# Patient Record
Sex: Male | Born: 1955 | Race: Asian | Hispanic: No | Marital: Married | Smoking: Former smoker
Health system: Southern US, Community
[De-identification: ages and names within clinical notes are randomized; demographics above are authoritative.]

## PROBLEM LIST (undated history)

## (undated) DIAGNOSIS — Z8719 Personal history of other diseases of the digestive system: Secondary | ICD-10-CM

## (undated) DIAGNOSIS — C679 Malignant neoplasm of bladder, unspecified: Secondary | ICD-10-CM

## (undated) DIAGNOSIS — I517 Cardiomegaly: Secondary | ICD-10-CM

## (undated) DIAGNOSIS — K769 Liver disease, unspecified: Secondary | ICD-10-CM

## (undated) DIAGNOSIS — N492 Inflammatory disorders of scrotum: Secondary | ICD-10-CM

## (undated) DIAGNOSIS — H9190 Unspecified hearing loss, unspecified ear: Secondary | ICD-10-CM

## (undated) DIAGNOSIS — K648 Other hemorrhoids: Secondary | ICD-10-CM

## (undated) DIAGNOSIS — C3490 Malignant neoplasm of unspecified part of unspecified bronchus or lung: Secondary | ICD-10-CM

## (undated) DIAGNOSIS — K759 Inflammatory liver disease, unspecified: Secondary | ICD-10-CM

## (undated) DIAGNOSIS — R079 Chest pain, unspecified: Secondary | ICD-10-CM

## (undated) DIAGNOSIS — R7303 Prediabetes: Secondary | ICD-10-CM

## (undated) HISTORY — PX: COLONOSCOPY: SHX174

## (undated) HISTORY — PX: UPPER GI ENDOSCOPY: SHX6162

## (undated) HISTORY — DX: Malignant neoplasm of bladder, unspecified: C67.9

## (undated) HISTORY — PX: BIOPSY TESTIS: PRO36

---

## 1998-10-09 ENCOUNTER — Ambulatory Visit (HOSPITAL_BASED_OUTPATIENT_CLINIC_OR_DEPARTMENT_OTHER): Admission: RE | Admit: 1998-10-09 | Discharge: 1998-10-09 | Payer: Self-pay | Admitting: Orthopedic Surgery

## 1999-07-21 ENCOUNTER — Ambulatory Visit (HOSPITAL_COMMUNITY): Admission: RE | Admit: 1999-07-21 | Discharge: 1999-07-21 | Payer: Self-pay | Admitting: Gastroenterology

## 2003-04-19 ENCOUNTER — Encounter: Payer: Self-pay | Admitting: Family Medicine

## 2003-04-19 ENCOUNTER — Encounter: Admission: RE | Admit: 2003-04-19 | Discharge: 2003-04-19 | Payer: Self-pay | Admitting: Family Medicine

## 2004-01-08 ENCOUNTER — Encounter: Admission: RE | Admit: 2004-01-08 | Discharge: 2004-01-08 | Payer: Self-pay | Admitting: Family Medicine

## 2004-06-19 ENCOUNTER — Encounter (INDEPENDENT_AMBULATORY_CARE_PROVIDER_SITE_OTHER): Payer: Self-pay | Admitting: *Deleted

## 2004-06-19 ENCOUNTER — Ambulatory Visit (HOSPITAL_COMMUNITY): Admission: RE | Admit: 2004-06-19 | Discharge: 2004-06-19 | Payer: Self-pay | Admitting: Gastroenterology

## 2004-10-07 ENCOUNTER — Encounter: Admission: RE | Admit: 2004-10-07 | Discharge: 2004-10-07 | Payer: Self-pay | Admitting: Gastroenterology

## 2004-12-23 ENCOUNTER — Encounter: Admission: RE | Admit: 2004-12-23 | Discharge: 2004-12-23 | Payer: Self-pay | Admitting: Family Medicine

## 2007-02-11 ENCOUNTER — Encounter: Admission: RE | Admit: 2007-02-11 | Discharge: 2007-02-11 | Payer: Self-pay | Admitting: Internal Medicine

## 2007-09-07 ENCOUNTER — Encounter: Admission: RE | Admit: 2007-09-07 | Discharge: 2007-09-07 | Payer: Self-pay | Admitting: Internal Medicine

## 2008-01-18 ENCOUNTER — Ambulatory Visit (HOSPITAL_COMMUNITY): Admission: RE | Admit: 2008-01-18 | Discharge: 2008-01-18 | Payer: Self-pay | Admitting: *Deleted

## 2008-01-18 ENCOUNTER — Encounter (INDEPENDENT_AMBULATORY_CARE_PROVIDER_SITE_OTHER): Payer: Self-pay | Admitting: *Deleted

## 2008-08-22 ENCOUNTER — Encounter: Admission: RE | Admit: 2008-08-22 | Discharge: 2008-08-22 | Payer: Self-pay | Admitting: Internal Medicine

## 2008-08-29 ENCOUNTER — Encounter: Admission: RE | Admit: 2008-08-29 | Discharge: 2008-08-29 | Payer: Self-pay | Admitting: Internal Medicine

## 2008-10-10 ENCOUNTER — Ambulatory Visit (HOSPITAL_BASED_OUTPATIENT_CLINIC_OR_DEPARTMENT_OTHER): Admission: RE | Admit: 2008-10-10 | Discharge: 2008-10-10 | Payer: Self-pay | Admitting: Urology

## 2008-10-10 ENCOUNTER — Encounter: Payer: Self-pay | Admitting: Urology

## 2009-12-04 ENCOUNTER — Encounter: Admission: RE | Admit: 2009-12-04 | Discharge: 2009-12-04 | Payer: Self-pay | Admitting: Internal Medicine

## 2010-02-24 ENCOUNTER — Encounter: Admission: RE | Admit: 2010-02-24 | Discharge: 2010-02-24 | Payer: Self-pay | Admitting: Internal Medicine

## 2010-07-02 ENCOUNTER — Encounter: Admission: RE | Admit: 2010-07-02 | Discharge: 2010-07-02 | Payer: Self-pay | Admitting: Internal Medicine

## 2011-04-28 NOTE — Op Note (Signed)
[name], Robert Lowery                     ACCOUNT NO.:  [id number]   MEDICAL RECORD NO.:  [id number]          PATIENT TYPE:  AMB   LOCATION:  ENDO                         FACILITY:  [facility]   PHYSICIAN:  Georgiana Spinner, M.D.    DATE OF BIRTH:  1956/11/11   DATE OF PROCEDURE:  01/18/2008  DATE OF DISCHARGE:                               OPERATIVE REPORT   PROCEDURE:  Upper endoscopy.   INDICATIONS:  Abdominal pain.   ANESTHESIA:  Fentanyl 50 mcg, Versed 4 mg.   PROCEDURE IN DETAIL:  With the patient mildly sedated in the left  lateral decubitus position the Pentax videoscopic endoscope was inserted  in the mouth and passed under direct vision through the esophagus which  appeared normal until we reached the distal esophagus and the  squamocolumnar junction would never fully open but there was an area  that I photographed and biopsied to rule out Barrett's esophagus.  We  entered into the stomach, fundus, body, antrum, duodenal bulb, second  portion of duodenum all appeared normal.  From this point the endoscope  was slowly withdrawn taking circumferential views of duodenal mucosa  until the endoscope had been pulled back into the stomach, placed in  retroflexion to view the stomach from below.  The endoscope was  straightened and withdrawn taking circumferential views of remaining  gastric and esophageal mucosa.  The patient's vital signs, pulse  oximeter remained stable.  The patient tolerated the procedure well  without apparent complication.   FINDINGS:  Loose wrap of the GE junction around the endoscope indicating  laxity of the lower esophageal sphincter, question of Barrett's  esophagus versus normal variant, biopsied.  Await biopsy report.  The  patient will call me for results and follow up with me as an outpatient  to proceed with colonoscopy as planned.           ______________________________  Georgiana Spinner, M.D.     GMO/MEDQ  D:  01/18/2008  T:  01/18/2008  Job:   [id number]

## 2011-04-28 NOTE — Op Note (Signed)
NAMEDJ, SENTENO                     ACCOUNT NO.:  [id number]   MEDICAL RECORD NO.:  [id number]          PATIENT TYPE:  AMB   LOCATION:  NESC                         FACILITY:  [facility]   PHYSICIAN:  Excell Seltzer. Annabell Howells, M.D.    DATE OF BIRTH:  07-18-1956   DATE OF PROCEDURE:  10/10/2008  DATE OF DISCHARGE:                               OPERATIVE REPORT   PROCEDURE:  Bilateral inguinal exploration with ultrasound-guided  excisional biopsy of bilateral testicular masses.   PREOPERATIVE DIAGNOSIS:  Bilateral testicular masses.   POSTOPERATIVE DIAGNOSIS:  Bilateral T1 seminoma.   SURGEON:  Dr. Bjorn Pippin.   ANESTHESIA:  General.   SPECIMEN:  Right and left testicular masses and no peripheral biopsies.   FINDINGS:  Frozen section of left testicular mass that is consistent  with seminoma.   DRAINS:  None.   BLOOD LOSS:  Minimal.   COMPLICATIONS:  None.   INDICATIONS:  Mr. Sabo is a 55 year old Asian male who was found to have  an enhancing lesion on a CT scan done for abdominal pain.  Follow-up  ultrasound revealed bilateral testicular hypoechoic lesions with  internal blood flow.  The lesion on the left measured 9 mm.  The lesion  on the right approximately 6 mm.  A panel of tumor markers was negative  and after reviewing the options, we have elected to proceed with  bilateral inguinal exploration with ultrasound-guided testicular biopsy.   FINDINGS AND PROCEDURE:  The patient was given a gram of Ancef.  He was  taken to the operating room where general anesthetic was induced.  He  was placed in the supine position.  His lower abdomen was shaved.  He  was prepped with Betadine solution and draped in usual sterile fashion.   A left inguinal incision was made with a knife.  This was carried down  through the subcutaneous tissues with a Bovie.  The external oblique  fascia was identified at the level of the ring.  It was then incised  along its fibers and the cord was identified.  The  ilioinguinal nerve  was identified and protected.  The cord was dissected out and the  testicle was delivered into the inguinal incision.  However, the  gubernacular attachments were not divided, but clamped.  A half inch  Penrose was used to occlude the cord prior to manipulation.  The tunica  vaginalis was then opened and a 10 MHz ultrasound probe was used to  localize the hypoechoic lesion in the mid lateral aspect of the testicle  adjacent to the epididymis.  A 30 gauge needle was placed into the  lesion under ultrasound guidance.  The tunics albuginea of the testicle  was then incised in a radial fashion to avoid large subcapsular blood  vessels.  A mosquito hemostat was then used to dissect out the small  mass targeted by the needle.  The mass had a slightly brownish cast  suggestive of seminoma.  It was removed intact and sent for frozen  section.  The frozen section returned consistent with the germ cell  tumor, probable seminoma.  Additional peripheral biopsies were obtained  to evaluate for carcinoma in situ.  The cord occlusion was then released  and hemostasis was achieved.  The capsule was closed with a running 4-0  PDS and the testicle was then returned to the tunica vaginalis which was  closed with a 3-0 chromic.  The testicle was then placed back into the  left hemi scrotum.  The external oblique fascia was closed using a  running 3-0 Vicryl.  The subcutaneous tissues were closed with  interrupted 3-0 chromic sutures and the skin was closed with a 4-0  Vicryl intracuticular stent.   The right side was then explored through an inguinal incision in  identical fashion.  The cord was dissected out and the testicle was  delivered leaving the gubernacular attachments intact.  The cord was  encircled with a 1/2-inch Penrose and occluded.  The tumor was localized  using ultrasound and a 30 gauge needle.  Once again this tumor was  located near the head of the epididymis and was  approximately 5 mm in  size.  The tunica albuginea was then incised once again to avoid  subcapsular vessels and the mass was dissected out and removed.  Since  we had had a positive frozen section on the left, we sent the right mass  for permanent section and Bouin solution which was the same solution  that the peripheral biopsies were sent in from the left.  Additional  peripheral biopsies to rule out carcinoma in situ were then sent as  well.  At this point, the occlusion was released, hemostasis was  achieved and the tunic albuginea was closed with 4-0 PDS followed by  closure of the tunica vaginalis with 3-0 chromic.  The testicle was  returned to the right hemi scrotum.  The external oblique fascia was  closed using a running 3-0 Vicryl.  Once again with care being taken to  avoid injury to the ilioinguinal nerve.  The subcutaneous tissues were  reapproximated using interrupted 3-0 chromic and the skin was closed  with a 4-0 Vicryl intracuticular stitch.  Prior to the final closure 1%  lidocaine with epinephrine was instilled injected 4 mL on each side.  At  the completion of the wound closures, half inch Steri-Strips cut in half  were placed to reinforce the wound followed by 4x4 dressing of fluff  Kerlix and scrotal support.  The patient's anesthetic was reversed.  He  was removed to the recovery room in stable condition.  There no  complications.      Excell Seltzer. Annabell Howells, M.D.  Electronically Signed     JJW/MEDQ  D:  10/11/2008  T:  10/11/2008  Job:  [id number]

## 2011-04-28 NOTE — Op Note (Signed)
Robert Lowery, Robert Lowery                     ACCOUNT NO.:  [id number]   MEDICAL RECORD NO.:  [id number]          PATIENT TYPE:  AMB   LOCATION:  ENDO                         FACILITY:  [facility]   PHYSICIAN:  Georgiana Spinner, M.D.    DATE OF BIRTH:  [birth year]   DATE OF PROCEDURE:  01/18/2008  DATE OF DISCHARGE:                               OPERATIVE REPORT   PROCEDURE:  Colonoscopy.   INDICATIONS:  Abdominal pain and colon cancer screening.   ANESTHESIA:  Fentanyl 25 mcg and Versed 2 mg.   PROCEDURE IN DETAIL:  With the patient mildly sedated in the left  lateral decubitus position, a rectal examination was performed which was  unremarkable to my exam.  Subsequently, the Pentax videoscopic  colonoscope was inserted into the rectum and passed under direct vision  to the cecum identified by the ileocecal valve and appendiceal orifice,  both of which were photographed. From this point, the colonoscope was  slowly withdrawn taking circumferential views of the colonic mucosa  stopping only to photograph and biopsy the ileocecal valve area which  appeared bumpy but I think was just prolapsing of terminal ileal type  tissue.  Photographs and biopsies were taken.  The endoscope was then  withdrawn, as noted, stopping only in the rectum which appeared normal  on direct showed hemorrhoids on retroflexed view. The endoscope was  straightened and withdrawn.  The patient's vital signs and pulse  oximeter remained stable.  The patient tolerated the procedure well  without apparent complication.   FINDINGS:  Internal hemorrhoids with probably, otherwise, unremarkable  exam.  Await biopsy report.  The patient will call me for results and  follow up with me as an outpatient.           ______________________________  Georgiana Spinner, M.D.     GMO/MEDQ  D:  01/18/2008  T:  01/18/2008  Job:  [id number]

## 2011-05-01 NOTE — Op Note (Signed)
NAME:  Robert Lowery, Robert Lowery                            ACCOUNT NO.:  [id number]   MEDICAL RECORD NO.:  [id number]                   PATIENT TYPE:  AMB   LOCATION:  ENDO                                 FACILITY:  [facility]   PHYSICIAN:  James L. Malon Kindle., M.D.          DATE OF BIRTH:  06-01-1956   DATE OF PROCEDURE:  06/19/2004  DATE OF DISCHARGE:                                 OPERATIVE REPORT   PROCEDURE:  Colonoscopy and biopsy.   MEDICATIONS:  1. Fentanyl 75 mcg.  2. Versed 7 mg IV.   INDICATION:  Multitude of vague GI symptoms, vague abdominal pain, and bowel  changes that come and go.  This is done to rule out some occult lesion of  the GI tract that may be causing the symptoms.   DESCRIPTION OF PROCEDURE:  The procedure had been explained to the patient  and consent obtained.  With the patient in the left lateral decubitus  position, the scope was inserted and advanced; a pediatric adjustable scope  was used.  Her prep was excellent.  We were able to reach the cecum without  difficulty.  The ileocecal valve appeared grossly abnormal with marked  cobblestoning, friability, and nodularity.  I entered the terminal ileum.  This had the same appearance in the terminal ileum.  Multiple biopsies were  obtained of the ileocecal valve and the terminal ileum and placed in jar #1.  I could not advance beyond about 5 cm into the terminal ileum.  The  remainder of the cecum was unremarkable as was the remainder of the colonic  mucosa.  The ascending, transverse, descending, and sigmoid colon were seen  well upon removal and were endoscopically normal.  Multiple random biopsies  were obtained, upon withdrawal placed in jar #2.  The rectum was normal from  withdrawal.  The scope was withdrawn.  The patient tolerated the procedure  well, was monitored throughout.   ASSESSMENT:  Cobblestoning of the ileocecal valve and the terminal ileum,  could be lymphoid hyperplasia or possibly Crohn's  disease.   PLAN:  We will check path and see back in the office in 3-4 weeks.                                               James L. Malon Kindle., M.D.    Waldron Session  D:  06/19/2004  T:  06/19/2004  Job:  [id number]   cc:   Thelma Barge P. Modesto Charon, M.D.  [address]  [place]  [place] [address]  Fax: [phone]

## 2011-07-07 ENCOUNTER — Other Ambulatory Visit: Payer: Self-pay | Admitting: Dermatology

## 2011-09-15 LAB — POCT HEMOGLOBIN-HEMACUE: Hemoglobin: 14.7

## 2012-09-22 ENCOUNTER — Other Ambulatory Visit: Payer: Self-pay | Admitting: Internal Medicine

## 2012-09-22 DIAGNOSIS — R1011 Right upper quadrant pain: Secondary | ICD-10-CM

## 2012-09-23 ENCOUNTER — Ambulatory Visit
Admission: RE | Admit: 2012-09-23 | Discharge: 2012-09-23 | Disposition: A | Discharge status: Home / Self Care | Payer: BC Managed Care – PPO | Source: Ambulatory Visit | Attending: Internal Medicine | Admitting: Internal Medicine

## 2012-09-23 DIAGNOSIS — R1011 Right upper quadrant pain: Secondary | ICD-10-CM

## 2013-11-06 ENCOUNTER — Ambulatory Visit
Admission: RE | Admit: 2013-11-06 | Discharge: 2013-11-06 | Disposition: A | Discharge status: Home / Self Care | Payer: BC Managed Care – PPO | Source: Ambulatory Visit | Attending: Internal Medicine | Admitting: Internal Medicine

## 2013-11-06 ENCOUNTER — Other Ambulatory Visit: Payer: Self-pay | Admitting: Internal Medicine

## 2013-11-06 DIAGNOSIS — R05 Cough: Secondary | ICD-10-CM

## 2014-12-04 ENCOUNTER — Other Ambulatory Visit: Payer: Self-pay | Admitting: Internal Medicine

## 2014-12-04 ENCOUNTER — Ambulatory Visit
Admission: RE | Admit: 2014-12-04 | Discharge: 2014-12-04 | Disposition: A | Discharge status: Home / Self Care | Payer: BC Managed Care – PPO | Source: Ambulatory Visit | Attending: Internal Medicine | Admitting: Internal Medicine

## 2014-12-04 DIAGNOSIS — R059 Cough, unspecified: Secondary | ICD-10-CM

## 2014-12-04 DIAGNOSIS — R05 Cough: Secondary | ICD-10-CM

## 2016-05-30 DIAGNOSIS — Z8719 Personal history of other diseases of the digestive system: Secondary | ICD-10-CM

## 2016-05-30 HISTORY — DX: Personal history of other diseases of the digestive system: Z87.19

## 2018-05-02 ENCOUNTER — Other Ambulatory Visit: Payer: Self-pay | Admitting: Urology

## 2018-05-04 ENCOUNTER — Encounter (HOSPITAL_COMMUNITY): Payer: Self-pay

## 2018-05-05 ENCOUNTER — Other Ambulatory Visit: Payer: Self-pay

## 2018-05-05 ENCOUNTER — Encounter (HOSPITAL_COMMUNITY): Payer: Self-pay

## 2018-05-05 ENCOUNTER — Encounter (HOSPITAL_COMMUNITY)
Admission: RE | Admit: 2018-05-05 | Discharge: 2018-05-05 | Disposition: A | Discharge status: Home / Self Care | Payer: Self-pay | Source: Ambulatory Visit | Attending: Urology | Admitting: Urology

## 2018-05-05 DIAGNOSIS — D494 Neoplasm of unspecified behavior of bladder: Secondary | ICD-10-CM | POA: Insufficient documentation

## 2018-05-05 DIAGNOSIS — Z01812 Encounter for preprocedural laboratory examination: Secondary | ICD-10-CM | POA: Insufficient documentation

## 2018-05-05 HISTORY — DX: Other hemorrhoids: K64.8

## 2018-05-05 HISTORY — DX: Unspecified hearing loss, unspecified ear: H91.90

## 2018-05-05 HISTORY — DX: Inflammatory disorders of scrotum: N49.2

## 2018-05-05 HISTORY — DX: Chest pain, unspecified: R07.9

## 2018-05-05 HISTORY — DX: Inflammatory liver disease, unspecified: K75.9

## 2018-05-05 HISTORY — DX: Personal history of other diseases of the digestive system: Z87.19

## 2018-05-05 HISTORY — DX: Liver disease, unspecified: K76.9

## 2018-05-05 HISTORY — DX: Cardiomegaly: I51.7

## 2018-05-05 LAB — CBC
HEMATOCRIT: 44 % (ref 39.0–52.0)
Hemoglobin: 14.6 g/dL (ref 13.0–17.0)
MCH: 29.6 pg (ref 26.0–34.0)
MCHC: 33.2 g/dL (ref 30.0–36.0)
MCV: 89.1 fL (ref 78.0–100.0)
Platelets: 277 10*3/uL (ref 150–400)
RBC: 4.94 MIL/uL (ref 4.22–5.81)
RDW: 13.1 % (ref 11.5–15.5)
WBC: 4.1 10*3/uL (ref 4.0–10.5)

## 2018-05-05 NOTE — Patient Instructions (Signed)
Your procedure is scheduled on: Tuesday, May 10, 2018   Surgery Time:  10:00AM-11:30AM   Report to [facility]  Entrance    Report to admitting at 8:00 AM   Call this number if you have problems the morning of surgery [phone]   Do not eat food or drink liquids :After Midnight.   Do NOT smoke after Midnight   Take these medicines the morning of surgery with A SIP OF WATER: None                               You may not have any metal on your body including  jewelry, and body piercings             Do not wear lotions, powders, perfumes/cologne, or deodorant                          Men may shave face and neck.   Do not bring valuables to the hospital. [place].   Contacts, dentures or bridgework may not be worn into surgery.    Patients discharged the day of surgery will not be allowed to drive home.   Name and phone number of your driver:  Claiborne Billings (wife) [phone]   Special Instructions: Bring a copy of your healthcare power of attorney and living will documents         the day of surgery if you haven't scanned them in before.              Please read over the following fact sheets you were given:  [facility] - Preparing for Surgery Before surgery, you can play an important role.  Because skin is not sterile, your skin needs to be as free of germs as possible.  You can reduce the number of germs on your skin by washing with CHG (chlorahexidine gluconate) soap before surgery.  CHG is an antiseptic cleaner which kills germs and bonds with the skin to continue killing germs even after washing. Please DO NOT use if you have an allergy to CHG or antibacterial soaps.  If your skin becomes reddened/irritated stop using the CHG and inform your nurse when you arrive at Short Stay. Do not shave (including legs and underarms) for at least 48 hours prior to the first CHG shower.  You may shave your  face/neck.  Please follow these instructions carefully:  1.  Shower with CHG Soap the night before surgery and the  morning of surgery.  2.  If you choose to wash your hair, wash your hair first as usual with your normal  shampoo.  3.  After you shampoo, rinse your hair and body thoroughly to remove the shampoo.                             4.  Use CHG as you would any other liquid soap.  You can apply chg directly to the skin and wash.  Gently with a scrungie or clean washcloth.  5.  Apply the CHG Soap to your body ONLY FROM THE NECK DOWN.   Do   not use on face/ open  Wound or open sores. Avoid contact with eyes, ears mouth and   genitals (private parts).                       Wash face,  Genitals (private parts) with your normal soap.             6.  Wash thoroughly, paying special attention to the area where your    surgery  will be performed.  7.  Thoroughly rinse your body with warm water from the neck down.  8.  DO NOT shower/wash with your normal soap after using and rinsing off the CHG Soap.                9.  Pat yourself dry with a clean towel.            10.  Wear clean pajamas.            11.  Place clean sheets on your bed the night of your first shower and do not  sleep with pets. Day of Surgery : Do not apply any lotions/deodorants the morning of surgery.  Please wear clean clothes to the hospital/surgery center.  FAILURE TO FOLLOW THESE INSTRUCTIONS MAY RESULT IN THE CANCELLATION OF YOUR SURGERY  PATIENT SIGNATURE_________________________________  NURSE SIGNATURE__________________________________  ________________________________________________________________________

## 2018-05-09 NOTE — H&P (Signed)
CC/HPI: CC: Bladder mass  Seen at [facility] in 03/2018 w/ vague abdominal pain. CT A/P w and w/o contrast revealed a 2.0cm anterior wall mass. No hydronephrosis. The patient denies any bouts of gross hematuria. No dysuria, intermittency, frequency, urge or hesitancy. No flank pain.   Smoked for 20 years, 1/2 a day. Has not smoked in the past 22 years.   CT from [facility] personally reviewed - 2.0 cm anterior bladder wall mass with peripheral calcifications. No hydronephrosis.   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 Jeffie Pollock. Has not had a recent scrotal ultrasound. No testicular pain. No inguinal pain. On exam no inguinal induration or firmness. Testicle soft and mobile.   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     ALLERGIES: No Allergies    MEDICATIONS: Aspir 81  Calcium + D  Omeprazole 20 mg capsule,delayed release Oral     GU PSH: Excise Lesion Testis - 2009      PSH Notes: Surgery Testis Excision Of Local Lesion Bilateral, Hand Surgery   NON-GU PSH: None   GU PMH: LLQ pain, Abdominal Pain In The Left Lower Belly (LLQ) - 2014 Personal Hx Oth male genital organs diseases, History of testicular mass - 2014 Testicular pain, unspecified, Testicular pain - 2014, Testicular Pain, - 2014    NON-GU PMH: Encounter for general adult medical examination without abnormal findings, Encounter for preventive health examination GERD    FAMILY HISTORY: Family Health Status Number - Runs In Family   SOCIAL HISTORY: None    Notes: Caffeine Use, Marital History - Currently Married, Previous History Of Smoking, Occupation:, Alcohol Use   REVIEW OF SYSTEMS:    GU Review Male:   Patient reports stream starts and stops and trouble starting your stream. Patient denies frequent urination, hard to postpone urination, burning/ pain with urination, get up at night to urinate,  leakage of urine, have to strain to urinate , erection problems, and penile pain.  Gastrointestinal (Upper):   Patient denies nausea, vomiting, and indigestion/ heartburn.  Gastrointestinal (Lower):   Patient denies diarrhea and constipation.  Constitutional:   Patient denies fever, night sweats, weight loss, and fatigue.  Skin:   Patient reports skin rash/ lesion and itching.   Eyes:   Patient denies blurred vision and double vision.  Ears/ Nose/ Throat:   Patient denies sore throat and sinus problems.  Hematologic/Lymphatic:   Patient denies swollen glands and easy bruising.  Cardiovascular:   Patient reports chest pains. Patient denies leg swelling.  Respiratory:   Patient denies cough and shortness of breath.  Endocrine:   Patient denies excessive thirst.  Musculoskeletal:   Patient denies back pain and joint pain.  Neurological:   Patient denies headaches and dizziness.  Psychologic:   Patient denies depression and anxiety.   VITAL SIGNS:      04/28/2018 02:45 PM  Weight 143 lb / 64.86 kg  Height 65 in / 165.1 cm  BP 128/76 mmHg  Pulse 52 /min  Temperature 97.4 F / 36.3 C  BMI 23.8 kg/m   GU PHYSICAL EXAMINATION:      Notes: Circumcised phallus, bilateral testis descended. Non tender, soft, mobile. Inguinal region with no firmness or nodularity.    MULTI-SYSTEM PHYSICAL EXAMINATION:    Constitutional: Well-nourished. No physical deformities. Normally developed. Good grooming.  Neck: Neck symmetrical, not swollen. Normal tracheal position.  Respiratory: No labored  breathing, no use of accessory muscles. Normal breath sounds.  Cardiovascular: Regular rate and rhythm. No murmur, no gallop. Normal temperature, normal extremity pulses, no swelling, no varicosities.  Lymphatic: No enlargement of neck, axillae, groin.  Skin: No paleness, no jaundice, no cyanosis. No lesion, no ulcer, no rash.  Neurologic / Psychiatric: Oriented to time, oriented to place, oriented to person. No  depression, no anxiety, no agitation.  Gastrointestinal: No mass, no tenderness, no rigidity, non obese abdomen.  Eyes: Normal conjunctivae. Normal eyelids.  Ears, Nose, Mouth, and Throat: Left ear no scars, no lesions, no masses. Right ear no scars, no lesions, no masses. Nose no scars, no lesions, no masses. Normal hearing. Normal lips.  Musculoskeletal: Normal gait and station of head and neck.     PAST DATA REVIEWED:  Source Of History:  Patient, Family/Caregiver  Records Review:   Previous Patient Records  X-Ray Review: C.T. Abdomen/Pelvis: Reviewed Films.     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 Discussed cysto in office to confirm CT findings vs proceeding directly to TURBT. Given cost concerns and suggstive findings on CT, will proceed directly to TURBT w/ b/l RPG.   Risks of TURBT discussed in detail - infection, bleeding, damage to associated structures - specifically bladder   Testes Ca- Hx of Leidig cell tumors. No recent scrotal ultrasound. Exam encouraging. Will  proceed with TURBT and diagnosis of bladder cancer. Will obtain scrotal ultrasound after follow up   Dr. Louis Meckel supervised this visit

## 2018-05-10 ENCOUNTER — Ambulatory Visit (HOSPITAL_COMMUNITY): Payer: Self-pay | Admitting: Anesthesiology

## 2018-05-10 ENCOUNTER — Ambulatory Visit (HOSPITAL_COMMUNITY)
Admission: RE | Admit: 2018-05-10 | Discharge: 2018-05-10 | Disposition: A | Discharge status: Home / Self Care | Payer: Self-pay | Source: Ambulatory Visit | Attending: Urology | Admitting: Urology

## 2018-05-10 ENCOUNTER — Ambulatory Visit (HOSPITAL_COMMUNITY): Payer: Self-pay

## 2018-05-10 ENCOUNTER — Encounter (HOSPITAL_COMMUNITY)
Admission: RE | Disposition: A | Discharge status: Home / Self Care | Payer: Self-pay | Source: Ambulatory Visit | Attending: Urology

## 2018-05-10 ENCOUNTER — Encounter (HOSPITAL_COMMUNITY): Payer: Self-pay

## 2018-05-10 DIAGNOSIS — K219 Gastro-esophageal reflux disease without esophagitis: Secondary | ICD-10-CM | POA: Insufficient documentation

## 2018-05-10 DIAGNOSIS — N50819 Testicular pain, unspecified: Secondary | ICD-10-CM | POA: Insufficient documentation

## 2018-05-10 DIAGNOSIS — Z7982 Long term (current) use of aspirin: Secondary | ICD-10-CM | POA: Insufficient documentation

## 2018-05-10 DIAGNOSIS — C679 Malignant neoplasm of bladder, unspecified: Secondary | ICD-10-CM

## 2018-05-10 DIAGNOSIS — Z79899 Other long term (current) drug therapy: Secondary | ICD-10-CM | POA: Insufficient documentation

## 2018-05-10 DIAGNOSIS — Z87891 Personal history of nicotine dependence: Secondary | ICD-10-CM | POA: Insufficient documentation

## 2018-05-10 DIAGNOSIS — Z8547 Personal history of malignant neoplasm of testis: Secondary | ICD-10-CM | POA: Insufficient documentation

## 2018-05-10 DIAGNOSIS — C673 Malignant neoplasm of anterior wall of bladder: Secondary | ICD-10-CM | POA: Insufficient documentation

## 2018-05-10 HISTORY — PX: TRANSURETHRAL RESECTION OF BLADDER TUMOR WITH MITOMYCIN-C: SHX6459

## 2018-05-10 HISTORY — PX: CYSTOSCOPY/RETROGRADE/URETEROSCOPY: SHX5316

## 2018-05-10 HISTORY — DX: Malignant neoplasm of bladder, unspecified: C67.9

## 2018-05-10 SURGERY — TRANSURETHRAL RESECTION OF BLADDER TUMOR WITH MITOMYCIN-C
Anesthesia: General | Site: Bladder

## 2018-05-10 MED ORDER — DEXAMETHASONE SODIUM PHOSPHATE 4 MG/ML IJ SOLN
INTRAMUSCULAR | Status: DC | PRN
  Administered 2018-05-10: 10 mg via INTRAVENOUS

## 2018-05-10 MED ORDER — MITOMYCIN CHEMO FOR BLADDER INSTILLATION 40 MG: 40.0000 mg | Freq: Once | INTRAVENOUS | Status: DC

## 2018-05-10 MED ORDER — FENTANYL CITRATE (PF) 100 MCG/2ML IJ SOLN
INTRAMUSCULAR | Status: AC
  Filled 2018-05-10: qty 2

## 2018-05-10 MED ORDER — OXYCODONE HCL 5 MG PO CAPS
5.0000 mg | ORAL_CAPSULE | ORAL | 0 refills | Status: DC | PRN
Start: 2018-05-10 — End: 2019-05-10

## 2018-05-10 MED ORDER — MIDAZOLAM HCL 5 MG/5ML IJ SOLN
INTRAMUSCULAR | Status: DC | PRN
  Administered 2018-05-10 (×2): 1 mg via INTRAVENOUS

## 2018-05-10 MED ORDER — SODIUM CHLORIDE 0.9 % IR SOLN
Status: DC | PRN
  Administered 2018-05-10: 15000 mL
  Administered 2018-05-10: 1000 mL

## 2018-05-10 MED ORDER — ROCURONIUM BROMIDE 100 MG/10ML IV SOLN
INTRAVENOUS | Status: DC | PRN
  Administered 2018-05-10: 50 mg via INTRAVENOUS

## 2018-05-10 MED ORDER — ASPIRIN 81 MG PO CHEW: 81.0000 mg | CHEWABLE_TABLET | Freq: Every day | ORAL | Status: DC

## 2018-05-10 MED ORDER — DEXAMETHASONE SODIUM PHOSPHATE 10 MG/ML IJ SOLN
INTRAMUSCULAR | Status: AC
  Filled 2018-05-10: qty 1

## 2018-05-10 MED ORDER — FENTANYL CITRATE (PF) 100 MCG/2ML IJ SOLN
25.0000 ug | INTRAMUSCULAR | Status: DC | PRN
  Administered 2018-05-10: 25 ug via INTRAVENOUS

## 2018-05-10 MED ORDER — PROPOFOL 10 MG/ML IV BOLUS
INTRAVENOUS | Status: AC
  Filled 2018-05-10: qty 40

## 2018-05-10 MED ORDER — LACTATED RINGERS IV SOLN
INTRAVENOUS | Status: DC
  Administered 2018-05-10: 09:00:00 via INTRAVENOUS

## 2018-05-10 MED ORDER — MIDAZOLAM HCL 2 MG/2ML IJ SOLN
INTRAMUSCULAR | Status: AC
  Filled 2018-05-10: qty 2

## 2018-05-10 MED ORDER — ONDANSETRON HCL 4 MG/2ML IJ SOLN
INTRAMUSCULAR | Status: DC | PRN
  Administered 2018-05-10: 4 mg via INTRAVENOUS

## 2018-05-10 MED ORDER — CEFAZOLIN SODIUM-DEXTROSE 2-4 GM/100ML-% IV SOLN
2.0000 g | INTRAVENOUS | Status: AC
  Administered 2018-05-10: 2 g via INTRAVENOUS
  Filled 2018-05-10: qty 100

## 2018-05-10 MED ORDER — FENTANYL CITRATE (PF) 100 MCG/2ML IJ SOLN
INTRAMUSCULAR | Status: DC | PRN
  Administered 2018-05-10: 100 ug via INTRAVENOUS

## 2018-05-10 MED ORDER — PROPOFOL 10 MG/ML IV BOLUS
INTRAVENOUS | Status: DC | PRN
  Administered 2018-05-10: 150 mg via INTRAVENOUS

## 2018-05-10 MED ORDER — SODIUM CHLORIDE 0.9 % IV SOLN
INTRAVENOUS | Status: DC | PRN
  Administered 2018-05-10: 12 mL

## 2018-05-10 MED ORDER — ROCURONIUM BROMIDE 10 MG/ML (PF) SYRINGE
PREFILLED_SYRINGE | INTRAVENOUS | Status: AC
  Filled 2018-05-10: qty 5

## 2018-05-10 MED ORDER — MITOMYCIN CHEMO FOR BLADDER INSTILLATION 40 MG
40.0000 mg | Freq: Once | INTRAVENOUS | Status: AC
  Administered 2018-05-10: 40 mg via INTRAVESICAL
  Filled 2018-05-10: qty 40

## 2018-05-10 MED ORDER — SUGAMMADEX SODIUM 200 MG/2ML IV SOLN
INTRAVENOUS | Status: DC | PRN
  Administered 2018-05-10: 200 mg via INTRAVENOUS

## 2018-05-10 MED ORDER — ONDANSETRON HCL 4 MG/2ML IJ SOLN
INTRAMUSCULAR | Status: AC
  Filled 2018-05-10: qty 2

## 2018-05-10 SURGICAL SUPPLY — 28 items
BAG URINE DRAINAGE (UROLOGICAL SUPPLIES) ×3 IMPLANT
BAG URO CATCHER STRL LF (MISCELLANEOUS) ×3 IMPLANT
BASKET STONE NCOMPASS (UROLOGICAL SUPPLIES) IMPLANT
CATH FOLEY 2WAY SLVR  5CC 20FR (CATHETERS) ×2
CATH FOLEY 2WAY SLVR 5CC 20FR (CATHETERS) ×1 IMPLANT
CATH URET 5FR 28IN OPEN ENDED (CATHETERS) IMPLANT
CATH URET DUAL LUMEN 6-10FR 50 (CATHETERS) ×3 IMPLANT
CLOTH BEACON ORANGE TIMEOUT ST (SAFETY) ×3 IMPLANT
COVER FOOTSWITCH UNIV (MISCELLANEOUS) IMPLANT
COVER SURGICAL LIGHT HANDLE (MISCELLANEOUS) ×3 IMPLANT
EXTRACTOR STONE NITINOL NGAGE (UROLOGICAL SUPPLIES) IMPLANT
FIBER LASER FLEXIVA 1000 (UROLOGICAL SUPPLIES) IMPLANT
FIBER LASER FLEXIVA 365 (UROLOGICAL SUPPLIES) IMPLANT
FIBER LASER FLEXIVA 550 (UROLOGICAL SUPPLIES) IMPLANT
FIBER LASER TRAC TIP (UROLOGICAL SUPPLIES) IMPLANT
GLOVE SURG SS PI 8.0 STRL IVOR (GLOVE) IMPLANT
GOWN STRL REUS W/TWL XL LVL3 (GOWN DISPOSABLE) ×3 IMPLANT
GUIDEWIRE STR DUAL SENSOR (WIRE) IMPLANT
IV NS 1000ML (IV SOLUTION) ×2
IV NS 1000ML BAXH (IV SOLUTION) ×1 IMPLANT
IV NS IRRIG 3000ML ARTHROMATIC (IV SOLUTION) ×3 IMPLANT
MANIFOLD NEPTUNE II (INSTRUMENTS) ×3 IMPLANT
PACK CYSTO (CUSTOM PROCEDURE TRAY) ×3 IMPLANT
SHEATH URETERAL 12FRX35CM (MISCELLANEOUS) ×3 IMPLANT
SYR 10ML LL (SYRINGE) ×3 IMPLANT
SYRINGE IRR TOOMEY STRL 70CC (SYRINGE) ×3 IMPLANT
TUBING CONNECTING 10 (TUBING) ×2 IMPLANT
TUBING CONNECTING 10' (TUBING) ×1

## 2018-05-10 NOTE — Anesthesia Postprocedure Evaluation (Signed)
Anesthesia Post Note  Patient: Robert Lowery  Procedure(s) Performed: CYSTOSCOPY TRANSURETHRAL RESECTION OF BLADDER TUMOR WITH MITOMYCIN-C (N/A Bladder) BILATERAL RETROGRADE (N/A Bladder)     Patient location during evaluation: PACU Anesthesia Type: General Level of consciousness: sedated Pain management: pain level controlled Vital Signs Assessment: post-procedure vital signs reviewed and stable Respiratory status: spontaneous breathing and respiratory function stable Cardiovascular status: stable Postop Assessment: no apparent nausea or vomiting Anesthetic complications: no    Last Vitals:  Vitals:   05/10/18 1345 05/10/18 1410  BP: (!) 153/73 (!) 142/72  Pulse: (!) 56 (!) 58  Resp: 14 16  Temp: (!) 36.4 C (!) 36.3 C  SpO2: 98% 99%    Last Pain:  Vitals:   05/10/18 1410  TempSrc:   PainSc: 0-No pain                 Kasra Melvin DANIEL

## 2018-05-10 NOTE — Anesthesia Procedure Notes (Signed)
Procedure Name: Intubation Date/Time: 05/10/2018 11:08 AM Performed by: Deliah Boston, CRNA Pre-anesthesia Checklist: Patient identified, Emergency Drugs available, Suction available and Patient being monitored Patient Re-evaluated:Patient Re-evaluated prior to induction Oxygen Delivery Method: Circle system utilized Preoxygenation: Pre-oxygenation with 100% oxygen Induction Type: IV induction Ventilation: Mask ventilation without difficulty Laryngoscope Size: Mac and 4 Grade View: Grade I Tube type: Oral Tube size: 7.5 mm Number of attempts: 1 Airway Equipment and Method: Stylet and Oral airway Placement Confirmation: ETT inserted through vocal cords under direct vision,  positive ETCO2 and breath sounds checked- equal and bilateral Secured at: 21 cm Tube secured with: Tape Dental Injury: Teeth and Oropharynx as per pre-operative assessment

## 2018-05-10 NOTE — Op Note (Addendum)
PATIENT:  [place] DIAGNOSIS: Bladder tumor  POST-OPERATIVE DIAGNOSIS: Same  PROCEDURE:  Procedure(s): 1. TRANSURETHRAL RESECTION OF BLADDER TUMOR (TURBT) (> 2 cm.) 2. Instillation of intravesical chemotherapy (mitomycin-C)  SURGEON:  Surgeon(s): [name] MD     Resident: Fredrik Rigger MD  ANESTHESIA:   General  EBL:  Minimal  DRAINS: Urethral catheter (20 Fr. Foley)   SPECIMEN:  Bladder tumor  DISPOSITION OF SPECIMEN:  PATHOLOGY  Indication: 62 y/o M with recent vague abdm pain who had a CT scan which showed a large anterior wall bladder tumor. Discussed options and patient opted for TURBT with biltateral retrograde pyelograms. Risk and benefits discussed.   Description of operation: The patient was taken to the operating room and administered general anesthesia. They were then placed on the table and moved to the dorsal lithotomy position after which the genitalia was sterilely prepped and draped. An official timeout was then performed.  We began with a 34fr cystoscope. The anterior and posterior urethra were normal. The bladder was systematically evaluated. There was a large anterior bladder wall tumor with superficial calcifications. No obvious satellite lesions or surrounding erythema. No other tumors were seen.   The ureteral orifices were in orthotopic position. We performed bilateral retrograde pyelograms with a 79fr open ended catheter.  Right RPG : delicate ureter with no filling defects, normal kidney with no hydronephrosis and normal calyxes. Left RPG: delicate ureter with no filling defects, normal kidney with no hydronephrosis and normal calyxes.   We switched to a 23 French resectoscope with the 30 lens and visual obturator were then passed into the bladder under direct visualization. The visual obturator was then removed and the Gyrus resectoscope element with 30  lens was then inserted.  I first began by resecting the anterior wall tumor. It was  hypervascular but we were able to perform a complete resection to the level of the muscularis propria. No obvious perforation noted. We had excellent hemostasis at the end of our resection.   The bladder was emptied and filled. reinspection of the bladder revealed all obvious tumor had been fully resected and there was no evidence of perforation. A [place]y was then used to irrigate the bladder and remove all of the portions of bladder tumor which were sent to pathology. I then removed the resectoscope.  A 20 French Foley catheter was then inserted in the bladder and irrigated. The irrigant returned lite pink urine.  The patient was awakened and taken to the recovery room.  While in the recovery room 40 mg of mitomycin-C in 40 cc of water were instilled in the bladder through the catheter and the catheter was plugged. This will remain indwelling for approximately one hour. It will then be drained from the bladder. The patient will go home with the foley and remove it at home.   PLAN OF CARE:  -Discharge to home after PACU.  -Remove foley catheter at home tomorrow at 6 am.   PATIENT DISPOSITION:  PACU - hemodynamically stable.

## 2018-05-10 NOTE — Anesthesia Preprocedure Evaluation (Addendum)
Anesthesia Evaluation  Patient identified by MRN, date of birth, ID band Patient awake    Reviewed: Allergy & Precautions, NPO status , Patient's Chart, lab work & pertinent test results  History of Anesthesia Complications Negative for: history of anesthetic complications  Airway Mallampati: II  TM Distance: >3 FB Neck ROM: Full    Dental no notable dental hx.    Pulmonary former smoker,    Pulmonary exam normal        Cardiovascular negative cardio ROS Normal cardiovascular exam     Neuro/Psych negative neurological ROS  negative psych ROS   GI/Hepatic hiatal hernia, (+) Hepatitis -, B  Endo/Other  negative endocrine ROS  Renal/GU negative Renal ROS  negative genitourinary   Musculoskeletal negative musculoskeletal ROS (+)   Abdominal   Peds negative pediatric ROS (+)  Hematology negative hematology ROS (+)   Anesthesia Other Findings   Reproductive/Obstetrics negative OB ROS                             Anesthesia Physical Anesthesia Plan  ASA: II  Anesthesia Plan: General   Post-op Pain Management:    Induction: Intravenous  PONV Risk Score and Plan: 2 and Ondansetron and Dexamethasone  Airway Management Planned: Oral ETT  Additional Equipment:   Intra-op Plan:   Post-operative Plan: Extubation in OR  Informed Consent: I have reviewed the patients History and Physical, chart, labs and discussed the procedure including the risks, benefits and alternatives for the proposed anesthesia with the patient or authorized representative who has indicated his/her understanding and acceptance.   Dental advisory given  Plan Discussed with: Anesthesiologist  Anesthesia Plan Comments:        Anesthesia Quick Evaluation

## 2018-05-10 NOTE — Interval H&P Note (Signed)
History and Physical Interval Note:  05/10/2018 10:16 AM  Robert Lowery  has presented today for surgery, with the diagnosis of bladder tumor  The various methods of treatment have been discussed with the patient and family. After consideration of risks, benefits and other options for treatment, the patient has consented to  Procedure(s): CYSTOSCOPY TRANSURETHRAL RESECTION OF BLADDER TUMOR WITH MITOMYCIN-C (N/A) BILATERAL RETROGRADE (N/A) as a surgical intervention .  The patient's history has been reviewed, patient examined, no change in status, stable for surgery.  I have reviewed the patient's chart and labs.  Questions were answered to the patient's satisfaction.     [name]

## 2018-05-10 NOTE — Transfer of Care (Signed)
Immediate Anesthesia Transfer of Care Note  Patient: Robert Lowery  Procedure(s) Performed: Procedure(s): CYSTOSCOPY TRANSURETHRAL RESECTION OF BLADDER TUMOR WITH MITOMYCIN-C (N/A) BILATERAL RETROGRADE (N/A)  Patient Location: PACU  Anesthesia Type:General  Level of Consciousness: Patient easily awoken, sedated, comfortable, cooperative, following commands, responds to stimulation.   Airway & Oxygen Therapy: Patient spontaneously breathing, ventilating well, oxygen via simple oxygen mask.  Post-op Assessment: Report given to PACU RN, vital signs reviewed and stable, moving all extremities.   Post vital signs: Reviewed and stable.  Complications: No apparent anesthesia complications  Last Vitals:  Vitals Value Taken Time  BP 162/81 05/10/2018 12:19 PM  Temp    Pulse 59 05/10/2018 12:21 PM  Resp 14 05/10/2018 12:21 PM  SpO2 100 % 05/10/2018 12:21 PM  Vitals shown include unvalidated device data.  Last Pain:  Vitals:   05/10/18 0844  TempSrc:   PainSc: 0-No pain         Complications: No apparent anesthesia complications

## 2018-05-10 NOTE — Discharge Instructions (Addendum)
Discharge instructions (Order [id number])  Nursing  Date: 05/10/2018 Department: [facility] Ordering: Alla Feeling, MD Authorizing: [name], MD  Panel Information   Panel 1   Surgeon Role  [name], MD Primary   Procedure Laterality Anesthesia  CYSTOSCOPY TRANSURETHRAL RESECTION OF BLADDER TUMOR WITH MITOMYCIN-C N/A General  BILATERAL RETROGRADE N/A Dorisann Frames, MD NPI: [id number]    Patient Information   Patient Name Robert Lowery, Robert Lowery [facility] Sex Male DOB [birth year] SSN [id number]  Order Information   Order Date/Time Release Date/Time Start Date/Time End Date/Time  05/10/18 12:23 PM None 05/10/2018 None  Order History  Outpatient  Date/Time Action Taken User Additional Information  05/10/18 1223 Sign Alla Feeling, MD Modify from Order: [id number]  Comments   - Please do not restart your ASA81mg  for at least 5 days  - Please remove your catheter tomorrow morning at 6 am - If you do not void after 6 hours, have trouble passing your urine, or are passing clots please call our office.   Activity: You are encouraged to ambulate frequently (about every hour during waking hours) to help prevent blood clots from forming in your legs or lungs. However, you should not engage in any heavy lifting (> 10-15 lbs), strenuous activity, or straining. Diet: You should advance your diet as instructed by your physician. It will be normal to have some bloating, nausea, and abdominal discomfort intermittently. Prescriptions: You will be provided a prescription for pain medication to take as needed. If your pain is not severe enough to require the prescription pain medication, you may take extra strength Tylenol instead which will have less side effects. You should also take a prescribed stool softener to avoid straining with bowel movements as the prescription pain medication may constipate you. Incisions: You may remove your dressing bandages 48 hours after  surgery if not removed in the hospital. You will either have some small staples or special tissue glue at each of the incision sites. Once the bandages are removed (if present), the incisions may stay open to air. You may start showering (but not soaking or bathing in water) the 2nd day after surgery and the incisions simply need to be patted dry after the shower. No additional care is needed. What to call us about: You should call the office [phone]) if you develop fever > 101 or develop persistent vomiting.

## 2018-05-11 ENCOUNTER — Encounter (HOSPITAL_COMMUNITY): Payer: Self-pay | Admitting: Urology

## 2018-10-21 ENCOUNTER — Ambulatory Visit (HOSPITAL_COMMUNITY)
Admission: RE | Admit: 2018-10-21 | Discharge: 2018-10-21 | Disposition: A | Discharge status: Home / Self Care | Payer: Self-pay | Source: Ambulatory Visit | Attending: Urology | Admitting: Urology

## 2018-10-21 ENCOUNTER — Other Ambulatory Visit: Payer: Self-pay | Admitting: Urology

## 2018-10-21 DIAGNOSIS — C673 Malignant neoplasm of anterior wall of bladder: Secondary | ICD-10-CM | POA: Insufficient documentation

## 2018-12-24 ENCOUNTER — Encounter: Payer: Self-pay | Admitting: *Deleted

## 2018-12-24 NOTE — [facility] Nurse Program (Unsigned)
  Dept: [facility] Nurse Program Note  Date of Encounter: 12/24/2018  Past Medical History: Past Medical History:  Diagnosis Date  . Chest pain    at rest  . Hearing loss    right ear  . Hepatitis    history of Hep. B  . History of hiatal hernia 05/30/2016   Small retrocardiac, noted on CT  . Internal hemorrhoids   . Liver lesion   . Mild cardiomegaly   . Scrotal nodule    Bilateral    Encounter Details: CNP Questionnaire - 12/21/18 1000      Questionnaire   Patient Status  Immigrant    Race  Asian    Location Patient Served At  [place]    Uninsured  Not Applicable    Food  No food insecurities    Housing/Utilities  Yes, have permanent housing    Transportation  No transportation needs    Interpersonal Safety  Yes, feel physically and emotionally safe where you currently live    Medication  No medication insecurities    Medical Provider  Yes    Referrals  Not Applicable    ED Visit Averted  Not Applicable    Life-Saving Intervention Made  Not Applicable      Wife stated that pt had bladder cancer and surgery done at [place] and f/u with urology in [place]. Not need chemo or radio therapy.  Will f/u

## 2019-04-10 ENCOUNTER — Ambulatory Visit (HOSPITAL_COMMUNITY)
Admission: RE | Admit: 2019-04-10 | Discharge: 2019-04-10 | Disposition: A | Discharge status: Home / Self Care | Payer: Medicare Other | Source: Ambulatory Visit | Attending: Urology | Admitting: Urology

## 2019-04-10 ENCOUNTER — Other Ambulatory Visit: Payer: Self-pay

## 2019-04-10 ENCOUNTER — Other Ambulatory Visit (HOSPITAL_COMMUNITY): Payer: Self-pay | Admitting: Urology

## 2019-04-10 DIAGNOSIS — Z8551 Personal history of malignant neoplasm of bladder: Secondary | ICD-10-CM

## 2019-04-26 ENCOUNTER — Encounter: Payer: Self-pay | Admitting: *Deleted

## 2019-04-27 ENCOUNTER — Other Ambulatory Visit: Payer: Self-pay

## 2019-04-28 ENCOUNTER — Other Ambulatory Visit: Payer: Self-pay | Admitting: Surgery

## 2019-04-28 ENCOUNTER — Institutional Professional Consult (permissible substitution): Payer: Medicare Other | Admitting: Surgery

## 2019-04-28 ENCOUNTER — Encounter: Payer: Self-pay | Admitting: Surgery

## 2019-04-28 VITALS — BP 146/86 | HR 67 | Temp 98.1°F | Resp 20 | Ht 64.0 in | Wt 143.0 lb

## 2019-04-28 DIAGNOSIS — R911 Solitary pulmonary nodule: Secondary | ICD-10-CM

## 2019-04-28 NOTE — Progress Notes (Signed)
Cardiothoracic Surgery Consultation  PCP is [name], MD Referring Provider is [name], MD  Chief Complaint  Patient presents with  . Lung Lesion    Surgical eval, Chest/ABD/Pelvic CT 04/20/19    HPI:  The patient is a 63 year old [place] gentleman who speaks fairly fluent [place] but has a [place] interpreter with him today.  He has a history of a high-grade infiltrative bladder cancer that was resected by TURBT with instillation of mitomycin-C by Dr. Jeffie Pollock on 05/10/2018.  He subsequently had recurrence and return to [place] where he underwent cystectomy with formation of a neobladder in 2019.  He had a chest x-ray on 04/10/2019 which showed a new 2.3 cm nodule in the right upper lobe which had not been present on his previous chest x-ray from 10/21/2018.  There are calcified granulomata in the right lung that were stable.  He subsequently underwent a CT scan of the chest on 04/20/2019.  This showed a 2.0 x 2.0 cm right upper lobe solid-appearing pulmonary nodule.  There is also a groundglass density measuring 1.2 x 1.2 cm in the right upper lobe which was stable from a previous CT scan dated 06/10/2018.  Past Medical History:  Diagnosis Date  . Bladder cancer ([place]) 05/10/2018   TUR of the BLADDER...DR. Jeffie Pollock  . Chest pain    at rest  . Hearing loss    right ear  . Hepatitis    history of Hep. B  . History of hiatal hernia 05/30/2016   Small retrocardiac, noted on CT  . Internal hemorrhoids   . Liver lesion   . Mild cardiomegaly   . Scrotal nodule    Bilateral    Past Surgical History:  Procedure Laterality Date  . BIOPSY TESTIS     bilateral testicle mass  . COLONOSCOPY    . CYSTOSCOPY/RETROGRADE/URETEROSCOPY N/A 05/10/2018   Procedure: BILATERAL RETROGRADE;  Surgeon: [name], MD;  Location: [facility];  Service: Urology;  Laterality: N/A;  . TRANSURETHRAL RESECTION OF BLADDER TUMOR WITH MITOMYCIN-C N/A 05/10/2018   Procedure: CYSTOSCOPY TRANSURETHRAL RESECTION OF BLADDER  TUMOR WITH MITOMYCIN-C;  Surgeon: [name], MD;  Location: [facility];  Service: Urology;  Laterality: N/A;  . UPPER GI ENDOSCOPY      No family history on file.  Social History Social History   Tobacco Use  . Smoking status: Former Research scientist (life sciences)  . Smokeless tobacco: Never Used  Substance Use Topics  . Alcohol use: Never    Frequency: Never  . Drug use: Never    Current Outpatient Medications  Medication Sig Dispense Refill  . aspirin 81 MG chewable tablet Chew 1 tablet (81 mg total) by mouth daily.    . Calcium Carb-Cholecalciferol (CALCIUM/VITAMIN D PO) Take 2 tablets by mouth daily.    . Carboxymethylcellul-Glycerin (REFRESH OPTIVE OP) Place 1 drop into both eyes as needed (For dry eyes or eye irritation.).    Marland Kitchen omeprazole (PRILOSEC OTC) 20 MG tablet Take 20 mg by mouth daily.    Marland Kitchen oxycodone (OXY-IR) 5 MG capsule Take 1-2 capsules (5-10 mg total) by mouth every 4 (four) hours as needed. 10 capsule 0   No current facility-administered medications for this visit.     No Known Allergies  Review of Systems  Constitutional: Negative for activity change, appetite change, fatigue and fever.       Weight gain   HENT: Negative.   Eyes: Negative.   Respiratory: Positive for cough. Negative for chest tightness, shortness of breath and wheezing.  Cardiovascular: Negative for chest pain.  Gastrointestinal: Negative.   Endocrine: Negative.   Musculoskeletal: Negative.  Negative for arthralgias, back pain, joint swelling and neck pain.  Skin: Negative.   Allergic/Immunologic: Negative.   Neurological: Negative for dizziness, seizures, speech difficulty, weakness and headaches.  Hematological: Negative.   Psychiatric/Behavioral: Negative.     BP (!) 146/86   Pulse 67   Temp 98.1 F (36.7 C) (Skin)   Resp 20   Ht 5\' 4"  (1.626 m)   Wt 143 lb (64.9 kg)   SpO2 97% Comment: RA  BMI 24.55 kg/m  Physical Exam Constitutional:      Appearance: Normal appearance. He is normal weight.   HENT:     Head: Normocephalic and atraumatic.     Nose: Nose normal.  Eyes:     Extraocular Movements: Extraocular movements intact.     Conjunctiva/sclera: Conjunctivae normal.     Pupils: Pupils are equal, round, and reactive to light.  Neck:     Musculoskeletal: Normal range of motion and neck supple.     Vascular: No carotid bruit.  Cardiovascular:     Rate and Rhythm: Normal rate and regular rhythm.     Pulses: Normal pulses.     Heart sounds: Normal heart sounds. No murmur.  Pulmonary:     Effort: Pulmonary effort is normal.     Breath sounds: Normal breath sounds.  Abdominal:     General: Abdomen is flat. Bowel sounds are normal.     Palpations: Abdomen is soft.  Musculoskeletal: Normal range of motion.        General: No swelling or tenderness.  Lymphadenopathy:     Cervical: No cervical adenopathy.  Skin:    General: Skin is warm and dry.     Coloration: Skin is not jaundiced.  Neurological:     General: No focal deficit present.     Mental Status: He is alert and oriented to person, place, and time.      Diagnostic Tests:  CLINICAL DATA:  History of bladder cancer  EXAM: CHEST - 2 VIEW  COMPARISON:  10/21/2018  FINDINGS: There is a new 2.3 cm nodule in the right upper lobe. Calcified granulomata in the right lung are stable. Normal heart size. Left lung is grossly clear. No pneumothorax or pleural effusion.  IMPRESSION: New 2.3 cm right upper lobe pulmonary nodule is worrisome for metastatic disease. CT chest is recommended.   Electronically Signed   By: Marybelle Killings M.D.   On: 04/10/2019 16:51   CT scan images from a CT of the chest, abdomen, and pelvis done on 04/20/2019 at an outside facility were reviewed on the PACS system.   Impression:  This 63 year old gentleman has a new 2.0 cm solid-appearing and well-circumscribed nodule in the right upper lobe which is new compared to a chest x-ray done in November 2019 and a CT scan of the  chest from 06/10/2018.  With his history of recent recurrent high-grade infiltrative bladder cancer treated with cystectomy it is concerning that this may be a metastatic lesion related to that cancer.  I think would be best to get a PET scan to characterize this further and to rule out other lesions.  This lung mass has hypermetabolic activity and there are no other concerning areas on the PET scan then we will have to decide whether to proceed with a CT-guided needle biopsy or do a wedge resection to remove the lesion.  I reviewed the current and prior CT and  CXR images with the patient and the alternatives for treatment.  He would like to proceed with a PET scan and I will see him back after that to review the results and discuss the treatment options from there.   Plan:  The patient will have a PET scan performed and then will return to my office to review the results and discuss further treatment.  I spent 60 minutes performing this consultation and > 50% of this time was spent face to face counseling and coordinating the care of this patient's RUL lung mass.   Gaye Pollack, MD [facility] [phone]

## 2019-05-05 ENCOUNTER — Encounter: Payer: Self-pay | Admitting: Urology

## 2019-05-09 ENCOUNTER — Other Ambulatory Visit: Payer: Self-pay

## 2019-05-09 ENCOUNTER — Ambulatory Visit (HOSPITAL_COMMUNITY)
Admission: RE | Admit: 2019-05-09 | Discharge: 2019-05-09 | Disposition: A | Discharge status: Home / Self Care | Payer: Medicare Other | Source: Ambulatory Visit | Attending: Surgery | Admitting: Surgery

## 2019-05-09 DIAGNOSIS — Z79899 Other long term (current) drug therapy: Secondary | ICD-10-CM | POA: Diagnosis not present

## 2019-05-09 DIAGNOSIS — Z7982 Long term (current) use of aspirin: Secondary | ICD-10-CM | POA: Insufficient documentation

## 2019-05-09 DIAGNOSIS — Z87891 Personal history of nicotine dependence: Secondary | ICD-10-CM | POA: Insufficient documentation

## 2019-05-09 DIAGNOSIS — R911 Solitary pulmonary nodule: Secondary | ICD-10-CM | POA: Insufficient documentation

## 2019-05-09 DIAGNOSIS — Z8551 Personal history of malignant neoplasm of bladder: Secondary | ICD-10-CM | POA: Insufficient documentation

## 2019-05-09 LAB — GLUCOSE, CAPILLARY: Glucose-Capillary: 86 mg/dL (ref 70–99)

## 2019-05-09 MED ORDER — FLUDEOXYGLUCOSE F - 18 (FDG) INJECTION
7.1000 | Freq: Once | INTRAVENOUS | Status: AC | PRN
  Administered 2019-05-09: 7.1 via INTRAVENOUS

## 2019-05-10 ENCOUNTER — Ambulatory Visit: Payer: Medicare Other | Admitting: Surgery

## 2019-05-10 ENCOUNTER — Encounter: Payer: Self-pay | Admitting: Surgery

## 2019-05-10 ENCOUNTER — Other Ambulatory Visit: Payer: Self-pay | Admitting: *Deleted

## 2019-05-10 VITALS — BP 166/85 | HR 67 | Temp 97.9°F | Resp 16 | Ht 64.0 in | Wt 143.0 lb

## 2019-05-10 DIAGNOSIS — R911 Solitary pulmonary nodule: Secondary | ICD-10-CM | POA: Diagnosis not present

## 2019-05-10 NOTE — Progress Notes (Signed)
HPI:  The patient returns today to review the results of his PET scan done yesterday to evaluate a 2.3 cm right upper lobe nodule with a history of high-grade infiltrative bladder cancer.  He is here today with an interpreter.  He has been feeling well with no new symptoms.  Current Outpatient Medications  Medication Sig Dispense Refill  . aspirin 81 MG chewable tablet Chew 1 tablet (81 mg total) by mouth daily.    . Calcium Carb-Cholecalciferol (CALCIUM/VITAMIN D PO) Take 2 tablets by mouth daily.    . Carboxymethylcellul-Glycerin (REFRESH OPTIVE OP) Place 1 drop into both eyes as needed (For dry eyes or eye irritation.).    Marland Kitchen omeprazole (PRILOSEC OTC) 20 MG tablet Take 20 mg by mouth daily.     No current facility-administered medications for this visit.      Diagnostic Tests:  CLINICAL DATA:  Initial treatment strategy for pulmonary nodule.  EXAM: NUCLEAR MEDICINE PET SKULL BASE TO THIGH  TECHNIQUE: 7.1 mCi F-18 FDG was injected intravenously. Full-ring PET imaging was performed from the skull base to thigh after the radiotracer. CT data was obtained and used for attenuation correction and anatomic localization.  Fasting blood glucose: 86 mg/dl  COMPARISON:  CT chest 04/20/2019  FINDINGS: Mediastinal blood pool activity: SUV max 2.61  Liver activity: SUV max NA  NECK: No hypermetabolic lymph nodes in the neck.  Incidental CT findings: none  CHEST: The large peripheral right upper lobe pulmonary nodule measures 2.1 cm and has an SUV max of 13.3. No additional hypermetabolic pulmonary nodules or masses identified. There are multiple calcified nodules identified within the right lung compatible with prior granulomatous disease.  FDG avid right hilar node has an SUV max of 11.69. FDG avid left hilar lymph node has an SUV max of 6.48.  Incidental CT findings: Calcified right paratracheal and right hilar lymph nodes identified. Aortic  atherosclerosis.  ABDOMEN/PELVIS: Multiple low-density liver foci are identified without corresponding increased FDG uptake, likely benign cysts. No uptake within the pancreas, spleen, or adrenal glands. Aortic atherosclerosis. No hypermetabolic lymph nodes within the abdomen or pelvis.  Incidental CT findings: Postoperative change from cystectomy and neobladder.  SKELETON: No focal hypermetabolic activity to suggest skeletal metastasis.  Incidental CT findings: none  IMPRESSION: 1. The pulmonary nodule within the right upper lobe is intensely hypermetabolic and worrisome for either metastatic disease or primary bronchogenic carcinoma. 2. Bilateral hilar lymph nodes exhibit moderate FDG uptake, right greater than left. Cannot rule out hilar lymph node metastasis. 3. No evidence for hypermetabolic tumor within the abdomen or pelvis.   Electronically Signed   By: Kerby Moors M.D.   On: 05/09/2019 16:37  Impression:  The right upper lobe pulmonary nodule is intensely hypermetabolic and concerning for metastatic disease or primary bronchogenic carcinoma.  Given his history of high-grade infiltrative bladder cancer that recurred after TURBT and required cystectomy in the past year I suspect this is most likely metastatic bladder cancer.  The PET scan also shows bilateral hilar lymph nodes that have hypermetabolic activity with an SUV max of 11.69 on the right and 6.48 on the left which is suspicious for hilar lymph node metastasis.  The hypermetabolic hilar lymph nodes do not appear particularly enlarged.  I think the best option at this time is to proceed with a CT-guided core needle biopsy of the right upper lobe pulmonary nodule since this is peripheral to try to demonstrate this is cancer and determine if it is metastatic bladder  cancer or primary bronchogenic carcinoma.  Depending on the results of this needle biopsy we would discuss him at thoracic oncology conference and  decide on the best treatment option.  I reviewed the PET scan images with the patient and answered all of his questions through the interpreter.  He appears comfortable with proceeding with a CT-guided needle biopsy.  Plan:  We will schedule CT-guided needle biopsy of the right upper lobe lung mass and I will see him back afterwards to discuss the results with him.   Gaye Pollack, MD [facility] [phone]

## 2019-05-11 ENCOUNTER — Telehealth (HOSPITAL_COMMUNITY): Payer: Self-pay

## 2019-05-12 ENCOUNTER — Ambulatory Visit (HOSPITAL_COMMUNITY)
Admission: RE | Admit: 2019-05-12 | Discharge: 2019-05-12 | Disposition: A | Discharge status: Home / Self Care | Payer: Medicare Other | Source: Ambulatory Visit | Attending: Surgery | Admitting: Surgery

## 2019-05-12 DIAGNOSIS — Z1159 Encounter for screening for other viral diseases: Secondary | ICD-10-CM | POA: Insufficient documentation

## 2019-05-13 LAB — NOVEL CORONAVIRUS, NAA (HOSP ORDER, SEND-OUT TO REF LAB; TAT 18-24 HRS): SARS-CoV-2, NAA: NOT DETECTED

## 2019-05-15 ENCOUNTER — Other Ambulatory Visit: Payer: Self-pay | Admitting: Radiology

## 2019-05-16 ENCOUNTER — Other Ambulatory Visit: Payer: Self-pay

## 2019-05-16 ENCOUNTER — Ambulatory Visit (HOSPITAL_COMMUNITY)
Admission: RE | Admit: 2019-05-16 | Discharge: 2019-05-16 | Disposition: A | Discharge status: Home / Self Care | Payer: Medicare Other | Source: Ambulatory Visit | Attending: Surgery | Admitting: Surgery

## 2019-05-16 ENCOUNTER — Ambulatory Visit (HOSPITAL_COMMUNITY)
Admission: RE | Admit: 2019-05-16 | Discharge: 2019-05-16 | Disposition: A | Discharge status: Home / Self Care | Payer: Medicare Other | Source: Ambulatory Visit | Attending: Radiology | Admitting: Radiology

## 2019-05-16 ENCOUNTER — Ambulatory Visit (HOSPITAL_COMMUNITY)
Admission: RE | Admit: 2019-05-16 | Discharge: 2019-05-16 | Disposition: A | Discharge status: Home / Self Care | Payer: Medicare Other | Source: Ambulatory Visit | Attending: Interventional Radiology | Admitting: Interventional Radiology

## 2019-05-16 ENCOUNTER — Encounter (HOSPITAL_COMMUNITY): Payer: Self-pay

## 2019-05-16 DIAGNOSIS — R0489 Hemorrhage from other sites in respiratory passages: Secondary | ICD-10-CM | POA: Insufficient documentation

## 2019-05-16 DIAGNOSIS — J95811 Postprocedural pneumothorax: Secondary | ICD-10-CM | POA: Diagnosis not present

## 2019-05-16 DIAGNOSIS — Z7982 Long term (current) use of aspirin: Secondary | ICD-10-CM | POA: Insufficient documentation

## 2019-05-16 DIAGNOSIS — Z87891 Personal history of nicotine dependence: Secondary | ICD-10-CM | POA: Diagnosis not present

## 2019-05-16 DIAGNOSIS — R918 Other nonspecific abnormal finding of lung field: Secondary | ICD-10-CM | POA: Insufficient documentation

## 2019-05-16 DIAGNOSIS — R911 Solitary pulmonary nodule: Secondary | ICD-10-CM | POA: Diagnosis present

## 2019-05-16 DIAGNOSIS — C679 Malignant neoplasm of bladder, unspecified: Secondary | ICD-10-CM | POA: Diagnosis not present

## 2019-05-16 LAB — CBC
HCT: 45.6 % (ref 39.0–52.0)
Hemoglobin: 14.9 g/dL (ref 13.0–17.0)
MCH: 29.4 pg (ref 26.0–34.0)
MCHC: 32.7 g/dL (ref 30.0–36.0)
MCV: 90.1 fL (ref 80.0–100.0)
Platelets: 237 10*3/uL (ref 150–400)
RBC: 5.06 MIL/uL (ref 4.22–5.81)
RDW: 12.6 % (ref 11.5–15.5)
WBC: 4.5 10*3/uL (ref 4.0–10.5)
nRBC: 0 % (ref 0.0–0.2)

## 2019-05-16 LAB — PROTIME-INR
INR: 1 (ref 0.8–1.2)
Prothrombin Time: 13.3 seconds (ref 11.4–15.2)

## 2019-05-16 MED ORDER — LIDOCAINE HCL 1 % IJ SOLN
INTRAMUSCULAR | Status: AC
  Filled 2019-05-16: qty 20

## 2019-05-16 MED ORDER — FENTANYL CITRATE (PF) 100 MCG/2ML IJ SOLN
INTRAMUSCULAR | Status: AC
  Filled 2019-05-16: qty 2

## 2019-05-16 MED ORDER — FENTANYL CITRATE (PF) 100 MCG/2ML IJ SOLN
INTRAMUSCULAR | Status: AC | PRN
  Administered 2019-05-16: 50 ug via INTRAVENOUS

## 2019-05-16 MED ORDER — MIDAZOLAM HCL 2 MG/2ML IJ SOLN
INTRAMUSCULAR | Status: AC
  Filled 2019-05-16: qty 2

## 2019-05-16 MED ORDER — SODIUM CHLORIDE 0.9 % IV SOLN: INTRAVENOUS | Status: DC

## 2019-05-16 MED ORDER — MIDAZOLAM HCL 2 MG/2ML IJ SOLN
INTRAMUSCULAR | Status: AC | PRN
  Administered 2019-05-16: 1 mg via INTRAVENOUS

## 2019-05-16 NOTE — Progress Notes (Signed)
D/c instructions given to patient and wife via telephone and via interpreter due to [place] restrictions.  All questions answered and patient and wife verbalized understanding

## 2019-05-16 NOTE — Progress Notes (Signed)
Patient ID: Robert Lowery, male   DOB: Mar 25, 1956, 63 y.o.   MRN: [id number]   Pt still doing well 100% RA Denies pain or SOB  CXR repeat at 200 pm: IMPRESSION: Grossly stable minimal right apical pneumothorax is noted. Discussed findings with Dr Annamaria Boots  Plan:  Pt understands to be restful at home this pm No lifting over 10 lbs To come to ED if SOB or pain occurs  I will call pt in am to check on him He has good understanding of plan

## 2019-05-16 NOTE — Progress Notes (Signed)
Dr. Reesa Chew paged about patient CXR results.  Awaiting return phone call

## 2019-05-16 NOTE — Discharge Instructions (Signed)
Needle Biopsy, Care After °These instructions tell you how to care for yourself after your procedure. Your doctor may also give you more specific instructions. Call your doctor if you have any problems or questions. °What can I expect after the procedure? °After the procedure, it is common to have: °· Soreness. °· Bruising. °· Mild pain. °Follow these instructions at home: ° °· Return to your normal activities as told by your doctor. Ask your doctor what activities are safe for you. °· Take over-the-counter and prescription medicines only as told by your doctor. °· Wash your hands with soap and water before you change your bandage (dressing). If you cannot use soap and water, use hand sanitizer. °· Follow instructions from your doctor about: °? How to take care of your puncture site. °? When and how to change your bandage. °? When to remove your bandage. °· Check your puncture site every day for signs of infection. Watch for: °? Redness, swelling, or pain. °? Fluid or blood.  °? Pus or a bad smell. °? Warmth. °· Do not take baths, swim, or use a hot tub until your doctor approves. Ask your doctor if you may take showers. You may only be allowed to take sponge baths. °· Keep all follow-up visits as told by your doctor. This is important. °Contact a doctor if you have: °· A fever. °· Redness, swelling, or pain at the puncture site, and it lasts longer than a few days. °· Fluid, blood, or pus coming from the puncture site. °· Warmth coming from the puncture site. °Get help right away if: °· You have a lot of bleeding from the puncture site. °Summary °· After the procedure, it is common to have soreness, bruising, or mild pain at the puncture site. °· Check your puncture site every day for signs of infection, such as redness, swelling, or pain. °· Get help right away if you have severe bleeding from your puncture site. °This information is not intended to replace advice given to you by your health care provider. Make  sure you discuss any questions you have with your health care provider. °Document Released: 11/12/2008 Document Revised: 12/13/2017 Document Reviewed: 12/13/2017 °Elsevier Interactive Patient Education © 2019 [employer] ° °

## 2019-05-16 NOTE — H&P (Signed)
Chief Complaint: Patient was seen in consultation today for right lung mass biopsy at the request of Gaye Pollack  Referring Physician(s): Gaye Pollack  Supervising Physician: Daryll Brod  Patient Status: [facility] - Out-pt  History of Present Illness: Robert Lowery is a 63 y.o. male    Bladder cancer hx [place] with Dr Louis Meckel Routine follow up shows new right lung mass  PET 05/09/2019 IMPRESSION: 1. The pulmonary nodule within the right upper lobe is intensely hypermetabolic and worrisome for either metastatic disease or primary bronchogenic carcinoma. 2. Bilateral hilar lymph nodes exhibit moderate FDG uptake, right greater than left. Cannot rule out hilar lymph node metastasis. 3. No evidence for hypermetabolic tumor within the abdomen or pelvis.  Referred to Dr Cyndia Bent 5/27 note:  The right upper lobe pulmonary nodule is intensely hypermetabolic and concerning for metastatic disease or primary bronchogenic carcinoma.  Given his history of high-grade infiltrative bladder cancer that recurred after TURBT and required cystectomy in the past year I suspect this is most likely metastatic bladder cancer.  The PET scan also shows bilateral hilar lymph nodes that have hypermetabolic activity with an SUV max of 11.69 on the right and 6.48 on the left which is suspicious for hilar lymph node metastasis.  The hypermetabolic hilar lymph nodes do not appear particularly enlarged.  I think the best option at this time is to proceed with a CT-guided core needle biopsy of the right upper lobe pulmonary nodule since this is peripheral to try to demonstrate this is cancer and determine if it is metastatic bladder cancer or primary bronchogenic carcinoma.  Depending on the results of this needle biopsy we would discuss him at thoracic oncology conference and decide on the best treatment option.  I reviewed the PET scan images with the patient and answered all of his questions through the  interpreter.  He appears comfortable with proceeding with a CT-guided needle biopsy.  Scheduled now for biopsy    Past Medical History:  Diagnosis Date   Bladder cancer ([place]) 05/10/2018   TUR of the BLADDER...DR. Jeffie Pollock   Chest pain    at rest   Hearing loss    right ear   Hepatitis    history of Hep. B   History of hiatal hernia 05/30/2016   Small retrocardiac, noted on CT   Internal hemorrhoids    Liver lesion    Mild cardiomegaly    Scrotal nodule    Bilateral    Past Surgical History:  Procedure Laterality Date   BIOPSY TESTIS     bilateral testicle mass   COLONOSCOPY     CYSTOSCOPY/RETROGRADE/URETEROSCOPY N/A 05/10/2018   Procedure: BILATERAL RETROGRADE;  Surgeon: [name], MD;  Location: [facility];  Service: Urology;  Laterality: N/A;   TRANSURETHRAL RESECTION OF BLADDER TUMOR WITH MITOMYCIN-C N/A 05/10/2018   Procedure: CYSTOSCOPY TRANSURETHRAL RESECTION OF BLADDER TUMOR WITH MITOMYCIN-C;  Surgeon: [name], MD;  Location: [facility];  Service: Urology;  Laterality: N/A;   UPPER GI ENDOSCOPY      Allergies: Patient has no known allergies.  Medications: Prior to Admission medications   Medication Sig Start Date End Date Taking? Authorizing Provider  aspirin 81 MG chewable tablet Chew 1 tablet (81 mg total) by mouth daily. Patient not taking: Reported on 05/15/2019 05/16/18   Alla Feeling, MD     History reviewed. No pertinent family history.  Social History   Socioeconomic History   Marital status: Married    Spouse name: Not on file  Number of children: Not on file   Years of education: Not on file   Highest education level: Not on file  Occupational History   Not on file  Social Needs   Financial resource strain: Not on file   Food insecurity:    Worry: Not on file    Inability: Not on file   Transportation needs:    Medical: Not on file    Non-medical: Not on file  Tobacco Use   Smoking status: Former Smoker   Smokeless  tobacco: Never Used  Substance and Sexual Activity   Alcohol use: Never    Frequency: Never   Drug use: Never   Sexual activity: Not on file  Lifestyle   Physical activity:    Days per week: Not on file    Minutes per session: Not on file   Stress: Not on file  Relationships   Social connections:    Talks on phone: Not on file    Gets together: Not on file    Attends religious service: Not on file    Active member of club or organization: Not on file    Attends meetings of clubs or organizations: Not on file    Relationship status: Not on file  Other Topics Concern   Not on file  Social History Narrative   Not on file    Review of Systems: A 12 point ROS discussed and pertinent positives are indicated in the HPI above.  All other systems are negative.  Review of Systems  Constitutional: Negative for activity change, fatigue, fever and unexpected weight change.  Respiratory: Negative for cough and shortness of breath.   Cardiovascular: Negative for chest pain.  Gastrointestinal: Negative for abdominal pain.  Neurological: Negative for weakness.  Psychiatric/Behavioral: Negative for behavioral problems and confusion.    Vital Signs: BP (!) 147/74    Pulse (!) 59    Temp 98.1 F (36.7 C) (Oral)    Resp 18    Ht 5\' 4"  (1.626 m)    Wt 140 lb (63.5 kg)    SpO2 95%    BMI 24.03 kg/m   Physical Exam Vitals signs reviewed.  Constitutional:      Appearance: Normal appearance.  Cardiovascular:     Rate and Rhythm: Normal rate and regular rhythm.     Heart sounds: Normal heart sounds.  Pulmonary:     Breath sounds: Normal breath sounds.  Abdominal:     Palpations: Abdomen is soft.  Musculoskeletal: Normal range of motion.  Skin:    General: Skin is warm and dry.  Neurological:     Mental Status: He is oriented to person, place, and time.  Psychiatric:        Mood and Affect: Mood normal.        Behavior: Behavior normal.        Thought Content: Thought content  normal.        Judgment: Judgment normal.     Comments: Consented pt through [place]     Imaging: Nm Pet Image Initial (pi) Skull Base To Thigh  Result Date: 05/09/2019 CLINICAL DATA:  Initial treatment strategy for pulmonary nodule. EXAM: NUCLEAR MEDICINE PET SKULL BASE TO THIGH TECHNIQUE: 7.1 mCi F-18 FDG was injected intravenously. Full-ring PET imaging was performed from the skull base to thigh after the radiotracer. CT data was obtained and used for attenuation correction and anatomic localization. Fasting blood glucose: 86 mg/dl COMPARISON:  CT chest 04/20/2019 FINDINGS: Mediastinal blood pool activity:  SUV max 2.61 Liver activity: SUV max NA NECK: No hypermetabolic lymph nodes in the neck. Incidental CT findings: none CHEST: The large peripheral right upper lobe pulmonary nodule measures 2.1 cm and has an SUV max of 13.3. No additional hypermetabolic pulmonary nodules or masses identified. There are multiple calcified nodules identified within the right lung compatible with prior granulomatous disease. FDG avid right hilar node has an SUV max of 11.69. FDG avid left hilar lymph node has an SUV max of 6.48. Incidental CT findings: Calcified right paratracheal and right hilar lymph nodes identified. Aortic atherosclerosis. ABDOMEN/PELVIS: Multiple low-density liver foci are identified without corresponding increased FDG uptake, likely benign cysts. No uptake within the pancreas, spleen, or adrenal glands. Aortic atherosclerosis. No hypermetabolic lymph nodes within the abdomen or pelvis. Incidental CT findings: Postoperative change from cystectomy and neobladder. SKELETON: No focal hypermetabolic activity to suggest skeletal metastasis. Incidental CT findings: none IMPRESSION: 1. The pulmonary nodule within the right upper lobe is intensely hypermetabolic and worrisome for either metastatic disease or primary bronchogenic carcinoma. 2. Bilateral hilar lymph nodes exhibit moderate  FDG uptake, right greater than left. Cannot rule out hilar lymph node metastasis. 3. No evidence for hypermetabolic tumor within the abdomen or pelvis. Electronically Signed   By: Kerby Moors M.D.   On: 05/09/2019 16:37    Labs:  CBC: Recent Labs    05/16/19 0947  WBC 4.5  HGB 14.9  HCT 45.6  PLT 237    COAGS: No results for input(s): INR, APTT in the last 8760 hours.  BMP: No results for input(s): NA, K, CL, CO2, GLUCOSE, BUN, CALCIUM, CREATININE, GFRNONAA, GFRAA in the last 8760 hours.  Invalid input(s): CMP  LIVER FUNCTION TESTS: No results for input(s): BILITOT, AST, ALT, ALKPHOS, PROT, ALBUMIN in the last 8760 hours.  TUMOR MARKERS: No results for input(s): AFPTM, CEA, CA199, CHROMGRNA in the last 8760 hours.  Assessment and Plan:  Hx Bladder Ca Follow up revealed new Rt Lung mas +PET Now scheduled for bx of same Risks and benefits of CT guided lung nodule biopsy was discussed with the patient including, but not limited to bleeding, hemoptysis, respiratory failure requiring intubation, infection, pneumothorax requiring chest tube placement, stroke from air embolism or even death.  All of the patient's questions were answered and the patient is agreeable to proceed. Consent signed and in chart.   Thank you for this interesting consult.  I greatly enjoyed meeting [facility] and look forward to participating in their care.  A copy of this report was sent to the requesting provider on this date.  Electronically Signed: Lavonia Drafts, PA-C 05/16/2019, 10:26 AM   I spent a total of  30 Minutes   in face to face in clinical consultation, greater than 50% of which was counseling/coordinating care for right lung mass biopsy

## 2019-05-16 NOTE — Progress Notes (Signed)
Referring Physician(s): Gaye Pollack  Supervising Physician: Daryll Brod  Patient Status:  [facility] OP  Chief Complaint:  Small ptx after Rt lung mass bx  Subjective:  Pt is without pain 02sat 98-100 % RA Up in bed Ate well Denies N/V  CXR after bx 12:22 pm:  IMPRESSION: 1. Tiny right apical pneumothorax accounting for less than 5% volume loss. 2. Stable right upper lobe pulmonary nodule.  Dr Annamaria Boots aware  Asked to evaluate pt in [facility]  Allergies: Patient has no known allergies.  Medications: Prior to Admission medications   Medication Sig Start Date End Date Taking? Authorizing Provider  aspirin 81 MG chewable tablet Chew 1 tablet (81 mg total) by mouth daily. Patient not taking: Reported on 05/15/2019 05/16/18   Alla Feeling, MD     Vital Signs: BP (!) 152/82   Pulse 70   Temp 97.6 F (36.4 C) (Oral)   Resp 15   Ht 5\' 4"  (1.626 m)   Wt 140 lb (63.5 kg)   SpO2 100%   BMI 24.03 kg/m   Physical Exam Vitals signs reviewed.  Constitutional:      Appearance: Normal appearance.  Cardiovascular:     Rate and Rhythm: Normal rate.     Heart sounds: Normal heart sounds.  Pulmonary:     Breath sounds: Normal breath sounds.     Comments: In NAD Breathing easily Abdominal:     Palpations: Abdomen is soft.  Musculoskeletal: Normal range of motion.  Skin:    General: Skin is warm and dry.     Comments: Site of bx clean and dry NT No bleeding  Neurological:     Mental Status: He is alert and oriented to person, place, and time.  Psychiatric:        Behavior: Behavior normal.     Imaging: Ct Biopsy  Result Date: 05/16/2019 INDICATION: Bladder cancer, PET positive peripheral right upper lobe nodule EXAM: CT-GUIDED BIOPSY RIGHT UPPER LOBE NODULE MEDICATIONS: 1% lidocaine local ANESTHESIA/SEDATION: 1.0 mg IV Versed; 50 mcg IV Fentanyl Moderate Sedation Time:  19 minutes The patient was continuously monitored during the procedure by the interventional  radiology nurse under my direct supervision. PROCEDURE: The procedure, risks, benefits, and alternatives were explained to the patient. Questions regarding the procedure were encouraged and answered. The patient understands and consents to the procedure. Interpreter was utilized. Previous imaging reviewed. Patient positioned supine. Noncontrast localization CT performed. The right upper lobe peripheral 2 cm nodule was localized. Overlying skin marked for a right lateral oblique approach. Under sterile conditions and local anesthesia, CT guidance was utilized to advance a 17 gauge 6.8 cm access needle into the lesion. Needle position confirmed with CT. Three 1 cm 18 gauge core biopsies obtained. Samples placed in formalin. Needle tract occluded with the bio sentry device. Postprocedure imaging demonstrates a small amount of adjacent pulmonary hemorrhage from the biopsy but no effusion or pneumothorax. Patient tolerated the procedure well without complication. Vital sign monitoring by nursing staff during the procedure will continue as patient is in the special procedures unit for post procedure observation. FINDINGS: The images document guide needle placement within the right upper lobe lesion. Post biopsy images demonstrate no effusion or pneumothorax. COMPLICATIONS: None immediate. IMPRESSION: Successful CT-guided core biopsy of the right upper lobe nodule Electronically Signed   By: Jerilynn Mages.  Shick M.D.   On: 05/16/2019 12:03   Dg Chest Port 1 View  Result Date: 05/16/2019 CLINICAL DATA:  63 year old male status post right upper  lobe pulmonary nodule biopsy. EXAM: PORTABLE CHEST 1 VIEW COMPARISON:  Lung biopsy images obtained earlier today. FINDINGS: Trace right apical pneumothorax, less than 5%. Stable right upper lobe pulmonary nodule. Stable calcified pulmonary nodules, mediastinal and right hilar lymph nodes. Cardiac and mediastinal contours remain within normal limits. No pleural effusion. No acute osseous  abnormality. IMPRESSION: 1. Tiny right apical pneumothorax accounting for less than 5% volume loss. 2. Stable right upper lobe pulmonary nodule. Electronically Signed   By: Jacqulynn Cadet M.D.   On: 05/16/2019 13:17    Labs:  CBC: Recent Labs    05/16/19 0947  WBC 4.5  HGB 14.9  HCT 45.6  PLT 237    COAGS: Recent Labs    05/16/19 0947  INR 1.0    BMP: No results for input(s): NA, K, CL, CO2, GLUCOSE, BUN, CALCIUM, CREATININE, GFRNONAA, GFRAA in the last 8760 hours.  Invalid input(s): CMP  LIVER FUNCTION TESTS: No results for input(s): BILITOT, AST, ALT, ALKPHOS, PROT, ALBUMIN in the last 8760 hours.  Assessment and Plan:  VSS 02 sat 100% RA Tin PTX after right lung bx Will recheck CXR 200 pm If stable may DC home Discussed with Dr Annamaria Boots Pt is aware and agreeable  Electronically Signed: Lavonia Drafts, PA-C 05/16/2019, 1:47 PM   I spent a total of 25 Minutes at the the patient's bedside AND on the patient's hospital floor or unit, greater than 50% of which was counseling/coordinating care for SMALL Rt PTX after bx

## 2019-05-16 NOTE — Procedures (Signed)
Bladder ca, RUL NODULE  S/P CT BX RUL NODULE No comp Stable ebl min Path pending Full report in pacs

## 2019-05-17 ENCOUNTER — Telehealth: Payer: Self-pay | Admitting: Radiology

## 2019-05-17 NOTE — Progress Notes (Signed)
  Right lung mass biopsy yesterday Small asymptomatic PTX after procedure Final cxr at 200 pm showed no change Pt was discharged to home   Called pt this am He states he is feeling fine Denies pain or SOB Slept well  Did ask him to remain restful today  Normal activity by tomorrow He states he has good understanding

## 2019-05-19 ENCOUNTER — Encounter: Payer: Self-pay | Admitting: *Deleted

## 2019-05-19 ENCOUNTER — Telehealth: Payer: Self-pay | Admitting: Surgery

## 2019-05-19 DIAGNOSIS — N3289 Other specified disorders of bladder: Secondary | ICD-10-CM

## 2019-05-19 NOTE — Telephone Encounter (Signed)
I reviewed the pathology report on 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  I suspect that he most likely also has bilateral hilar metastases based on hypermetabolic activity there on his PET scan.  He will be discussed at [facility] and will be seen in clinic later that day.  I called [name] son and discussed the results with him with his father and mother in attendance.  All their questions have been answered and they are in agreement with coming back to [facility] clinic.

## 2019-05-19 NOTE — Progress Notes (Signed)
I received referral from [facility] on Robert Lowery.  He has metastatic bladder cancer.  I completed referral and will update new patient coordinator to schedule patient with med onc.

## 2019-05-22 ENCOUNTER — Telehealth: Payer: Self-pay | Admitting: Oncology

## 2019-05-22 ENCOUNTER — Ambulatory Visit: Payer: Medicare Other | Admitting: Surgery

## 2019-05-22 NOTE — Telephone Encounter (Signed)
Cld [name], Robert Lowery, and scheduled him to see Dr. Alen Blew on 6/11 at 2pm. Aware that his father should arrive 20 minutes early.

## 2019-05-23 ENCOUNTER — Ambulatory Visit: Payer: Medicare Other | Admitting: Surgery

## 2019-05-25 ENCOUNTER — Inpatient Hospital Stay: Payer: Medicare Other | Attending: Oncology | Admitting: Oncology

## 2019-05-25 ENCOUNTER — Other Ambulatory Visit: Payer: Self-pay

## 2019-05-25 VITALS — BP 157/81 | HR 61 | Temp 99.7°F | Resp 18 | Ht 64.0 in | Wt 140.9 lb

## 2019-05-25 DIAGNOSIS — Z87891 Personal history of nicotine dependence: Secondary | ICD-10-CM | POA: Insufficient documentation

## 2019-05-25 DIAGNOSIS — C679 Malignant neoplasm of bladder, unspecified: Secondary | ICD-10-CM | POA: Diagnosis not present

## 2019-05-25 DIAGNOSIS — R05 Cough: Secondary | ICD-10-CM | POA: Diagnosis not present

## 2019-05-25 DIAGNOSIS — Z79899 Other long term (current) drug therapy: Secondary | ICD-10-CM | POA: Diagnosis not present

## 2019-05-25 DIAGNOSIS — Z7982 Long term (current) use of aspirin: Secondary | ICD-10-CM | POA: Insufficient documentation

## 2019-05-25 DIAGNOSIS — Z5111 Encounter for antineoplastic chemotherapy: Secondary | ICD-10-CM | POA: Insufficient documentation

## 2019-05-25 DIAGNOSIS — I700 Atherosclerosis of aorta: Secondary | ICD-10-CM | POA: Insufficient documentation

## 2019-05-25 DIAGNOSIS — C7800 Secondary malignant neoplasm of unspecified lung: Secondary | ICD-10-CM | POA: Insufficient documentation

## 2019-05-25 DIAGNOSIS — Z7189 Other specified counseling: Secondary | ICD-10-CM

## 2019-05-25 MED ORDER — PROCHLORPERAZINE MALEATE 10 MG PO TABS: 10.0000 mg | ORAL_TABLET | Freq: Four times a day (QID) | ORAL | 0 refills | Status: DC | PRN

## 2019-05-25 MED ORDER — LIDOCAINE-PRILOCAINE 2.5-2.5 % EX CREA: 1.0000 "application " | TOPICAL_CREAM | CUTANEOUS | 0 refills | Status: DC | PRN

## 2019-05-25 NOTE — Progress Notes (Addendum)
Reason for the request:    Metastatic urothelial carcinoma.  HPI: I was asked by Dr. Cyndia Bent  to evaluate Robert Lowery for new diagnosis of metastatic urothelial carcinoma.  He is a 63 year old man native of [place] currently lives with his wife and son locally.  He was diagnosed with bladder cancer in May 2019 after presenting with abdominal pain and hematuria and found to have a bladder mass anteriorly based on a CT scan.  On May 10, 2018 he underwent TURBT under the care of Dr. 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  Based on these findings he underwent a radical cystectomy performed in July 2019 done in [place] with the exact pathology not available to me at this time.  He appeared to have a neobladder formation and did not receive any adjuvant therapy.  He started developing symptoms of cough and April 2020 and a chest x-ray showed a 2.3 cm nodule in the right upper lobe.  CT scan on Apr 20, 2019 showed a 2.0 x 2.0 cm right upper lobe pulmonary nodule.  PET CT scan obtained on May 09, 2019 showed right upper lobe pulmonary nodule that is intensely hypermetabolic with bilateral hilar lymph node involvement that exhibit moderate FDG uptake.  No evidence of any other metastasis noted.  He subsequently underwent a biopsy completed on June 2 of 2020 which showed metastatic carcinoma that is consistent with urothelial primary.  Immunohistochemical stain showed positive cytokeratin 20, GATA 3 and p63.  He was negative for cytokeratin 5/6 and PAX 8.  Based on these findings he was referred to me for evaluation.  Clinically, he feels reasonably well without any complaints.  He is urinating freely although he has decreased sensation and urge of urination.  He denies any hemoptysis or hematemesis.  He does report some occasional cough.  He  does not report any headaches, blurry vision, syncope or seizures. Does not report  any fevers, chills or sweats.  Does not report any cough, wheezing or hemoptysis.  Does not report any chest pain, palpitation, orthopnea or leg edema.  Does not report any nausea, vomiting or abdominal pain.  Does not report any constipation or diarrhea.  Does not report any skeletal complaints.    Does not report frequency, urgency or hematuria.  Does not report any skin rashes or lesions. Does not report any heat or cold intolerance.  Does not report any lymphadenopathy or petechiae.  Does not report any anxiety or depression.  Remaining review of systems is negative.    Past Medical History:  Diagnosis Date  . Bladder cancer ([facility]) 05/10/2018   TUR of the BLADDER...DR. Jeffie Pollock  . Chest pain    at rest  . Hearing loss    right ear  . Hepatitis    history of Hep. B  . History of hiatal hernia 05/30/2016   Small retrocardiac, noted on CT  . Internal hemorrhoids   . Liver lesion   . Mild cardiomegaly   . Scrotal nodule    Bilateral  :  Past Surgical History:  Procedure Laterality Date  . BIOPSY TESTIS     bilateral testicle mass  . COLONOSCOPY    . CYSTOSCOPY/RETROGRADE/URETEROSCOPY N/A 05/10/2018   Procedure: BILATERAL RETROGRADE;  Surgeon: [name], MD;  Location: [facility];  Service: Urology;  Laterality: N/A;  . TRANSURETHRAL RESECTION OF BLADDER TUMOR WITH MITOMYCIN-C N/A 05/10/2018   Procedure: CYSTOSCOPY TRANSURETHRAL RESECTION OF BLADDER TUMOR WITH  MITOMYCIN-C;  Surgeon: [name], MD;  Location: [facility];  Service: Urology;  Laterality: N/A;  . UPPER GI ENDOSCOPY    :   Current Outpatient Medications:  .  aspirin 81 MG chewable tablet, Chew 1 tablet (81 mg total) by mouth daily. (Patient not taking: Reported on 05/15/2019), Disp: , Rfl: :  No Known Allergies:  No family history on file.:  Social History   Socioeconomic History  . Marital status: Married    Spouse name: Not on file  . Number of children: Not on file  . Years of education: Not on file  . Highest  education level: Not on file  Occupational History  . Not on file  Social Needs  . Financial resource strain: Not on file  . Food insecurity    Worry: Not on file    Inability: Not on file  . Transportation needs    Medical: Not on file    Non-medical: Not on file  Tobacco Use  . Smoking status: Former Research scientist (life sciences)  . Smokeless tobacco: Never Used  Substance and Sexual Activity  . Alcohol use: Never    Frequency: Never  . Drug use: Never  . Sexual activity: Not on file  Lifestyle  . Physical activity    Days per week: Not on file    Minutes per session: Not on file  . Stress: Not on file  Relationships  . Social Herbalist on phone: Not on file    Gets together: Not on file    Attends religious service: Not on file    Active member of club or organization: Not on file    Attends meetings of clubs or organizations: Not on file    Relationship status: Not on file  . Intimate partner violence    Fear of current or ex partner: Not on file    Emotionally abused: Not on file    Physically abused: Not on file    Forced sexual activity: Not on file  Other Topics Concern  . Not on file  Social History Narrative  . Not on file  :  Pertinent items are noted in HPI.  Exam: Blood pressure (!) 157/81, pulse 61, temperature 99.7 F (37.6 C), temperature source Oral, resp. rate 18, height 5\' 4"  (1.626 m), weight 140 lb 14.4 oz (63.9 kg), SpO2 99 %.  ECOG 0 General appearance: alert and cooperative appeared without distress. Head: atraumatic without any abnormalities. Eyes: conjunctivae/corneas clear. PERRL.  Sclera anicteric. Throat: lips, mucosa, and tongue normal; without oral thrush or ulcers. Resp: clear to auscultation bilaterally without rhonchi, wheezes or dullness to percussion. Cardio: regular rate and rhythm, S1, S2 normal, no murmur, click, rub or gallop GI: soft, non-tender; bowel sounds normal; no masses,  no organomegaly Skin: Skin color, texture, turgor  normal. No rashes or lesions Lymph nodes: Cervical, supraclavicular, and axillary nodes normal. Neurologic: Grossly normal without any motor, sensory or deep tendon reflexes. Musculoskeletal: No joint deformity or effusion.  CBC    Component Value Date/Time   WBC 4.5 05/16/2019 0947   RBC 5.06 05/16/2019 0947   HGB 14.9 05/16/2019 0947   HCT 45.6 05/16/2019 0947   PLT 237 05/16/2019 0947   MCV 90.1 05/16/2019 0947   MCH 29.4 05/16/2019 0947   MCHC 32.7 05/16/2019 0947   RDW 12.6 05/16/2019 0947           Nm Pet Image Initial (pi) Skull Base To Thigh  Result Date: 05/09/2019 CLINICAL DATA:  Initial treatment strategy for pulmonary nodule. EXAM: NUCLEAR MEDICINE PET SKULL BASE TO THIGH TECHNIQUE: 7.1 mCi F-18 FDG was injected intravenously. Full-ring PET imaging was performed from the skull base to thigh after the radiotracer. CT data was obtained and used for attenuation correction and anatomic localization. Fasting blood glucose: 86 mg/dl COMPARISON:  CT chest 04/20/2019 FINDINGS: Mediastinal blood pool activity: SUV max 2.61 Liver activity: SUV max NA NECK: No hypermetabolic lymph nodes in the neck. Incidental CT findings: none CHEST: The large peripheral right upper lobe pulmonary nodule measures 2.1 cm and has an SUV max of 13.3. No additional hypermetabolic pulmonary nodules or masses identified. There are multiple calcified nodules identified within the right lung compatible with prior granulomatous disease. FDG avid right hilar node has an SUV max of 11.69. FDG avid left hilar lymph node has an SUV max of 6.48. Incidental CT findings: Calcified right paratracheal and right hilar lymph nodes identified. Aortic atherosclerosis. ABDOMEN/PELVIS: Multiple low-density liver foci are identified without corresponding increased FDG uptake, likely benign cysts. No uptake within the pancreas, spleen, or adrenal glands. Aortic atherosclerosis. No hypermetabolic lymph nodes within the abdomen or  pelvis. Incidental CT findings: Postoperative change from cystectomy and neobladder. SKELETON: No focal hypermetabolic activity to suggest skeletal metastasis. Incidental CT findings: none IMPRESSION: 1. The pulmonary nodule within the right upper lobe is intensely hypermetabolic and worrisome for either metastatic disease or primary bronchogenic carcinoma. 2. Bilateral hilar lymph nodes exhibit moderate FDG uptake, right greater than left. Cannot rule out hilar lymph node metastasis. 3. No evidence for hypermetabolic tumor within the abdomen or pelvis. Electronically Signed   By: Kerby Moors M.D.   On: 05/09/2019 16:37     Dg Chest Port 1 View  Result Date: 05/16/2019 CLINICAL DATA:  Pneumothorax after biopsy. EXAM: PORTABLE CHEST 1 VIEW COMPARISON:  Radiograph of same day. FINDINGS: The heart size and mediastinal contours are within normal limits. Grossly stable minimal right apical pneumothorax is noted. Stable right upper lobe lung mass is noted. Left lung is clear. The visualized skeletal structures are unremarkable. IMPRESSION: Grossly stable minimal right apical pneumothorax is noted. Electronically Signed   By: Marijo Conception M.D.   On: 05/16/2019 14:25   Dg Chest Port 1 View  Result Date: 05/16/2019 CLINICAL DATA:  63 year old male status post right upper lobe pulmonary nodule biopsy. EXAM: PORTABLE CHEST 1 VIEW COMPARISON:  Lung biopsy images obtained earlier today. FINDINGS: Trace right apical pneumothorax, less than 5%. Stable right upper lobe pulmonary nodule. Stable calcified pulmonary nodules, mediastinal and right hilar lymph nodes. Cardiac and mediastinal contours remain within normal limits. No pleural effusion. No acute osseous abnormality. IMPRESSION: 1. Tiny right apical pneumothorax accounting for less than 5% volume loss. 2. Stable right upper lobe pulmonary nodule. Electronically Signed   By: Jacqulynn Cadet M.D.   On: 05/16/2019 13:17    Assessment and Plan:     63 year old man with  1.  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  He has presented with non-muscle invasive disease in May 2020 and underwent a radical cystectomy in July 2019.  Based on these findings, I have recommended proceeding with systemic chemotherapy.  Despite the limited metastatic sites, it is very possible that we are dealing with micrometastatic disease and potential involvement of thoracic lymphadenopathy.   The logistics and rationale for using chemotherapy was reviewed today in detail.  Complication associated with cisplatin and gemcitabine chemotherapy was discussed.  These  complications include nausea, vomiting, myelosuppression, fatigue, infusion related complications, renal insufficiency, neutropenia, neutropenic sepsis and rarely serious thrombosis, hospitalization and death.  The benefit would also if he has an excellent response to chemotherapy, curative surgical resection may be attempted.  The plan is to treat with gemcitabine and cisplatin on day 1, gemcitabine day 8 out of a 21-day cycle.  Anticipate needing 6 cycles of therapy.    The role for immunotherapy in this particular setting was also reviewed.  At this time, I feel that first-line chemotherapy is the standard of care.  Adding immunotherapy to chemotherapy has not added any benefit as of recent data.  Using single agent immunotherapy instead of chemotherapy is not recommended at this time.  We also discussed the role of switch maintenance of immunotherapy after completing chemotherapy.  I think he will be an excellent candidate for switch maintenance utilizing immunotherapy after completing systemic treatment.  After discussion today he will consider this and let me know in the near future.  He would like to discuss this further with his family.  I have discussed the aspects of his treatment and answer his questions in detail as well as questions  provided by his son via phone     2.  IV access: Risks and benefits of using Port-A-Cath versus peripheral veins was discussed today.  Complication associated with Port-A-Cath insertion include bleeding, infection and thrombosis.  He will let me know about his decision in the near future.   3.  Antiemetics: Prescription for Compazine was made available to him.   4.  Renal function surveillance: We will continue to monitor on cisplatin therapy.  Baseline creatinine is normal.   5.  Goals of care:  His disease remains potentially curable but he understands it is a long odds at this time given the fact that we are dealing with stage IV disease.  Aggressive therapy is warranted however given his excellent performance status and good health.   6.  Follow-up: We will be in the immediate future once he is ready to proceed.   80  minutes was spent with the patient face-to-face today.  More than 50% of time was spent on reviewing his disease status, imaging studies, treatment options, complications related therapy and answering questions regarding future plan of care and prognosis.    Thank you for the referral.  A copy of this consult has been forwarded to the requesting physician.   Addendum: Patient and his family agreed to proceed with chemotherapy and a Port-A-Cath insertion which will be scheduled in the near future.

## 2019-05-25 NOTE — Addendum Note (Signed)
Addended by: Wyatt Portela on: 05/25/2019 03:45 PM   Modules accepted: Orders

## 2019-05-25 NOTE — Progress Notes (Signed)
START ON PATHWAY REGIMEN - Bladder     A cycle is every 21 days:     Gemcitabine      Cisplatin   **Always confirm dose/schedule in your pharmacy ordering system**  Patient Characteristics: Metastatic Disease, First Line, No Prior Platinum-Based Therapy, Good Renal Function (CrCl ? 50 mL/min) Therapeutic Status: Metastatic Disease Line of Therapy: First Line Prior Platinum-Based Therapy<= No Renal Function: Good Renal Function (CrCl ? 50 mL/min) Intent of Therapy: Curative Intent, Discussed with Patient

## 2019-05-29 ENCOUNTER — Telehealth: Payer: Self-pay

## 2019-05-29 ENCOUNTER — Telehealth: Payer: Self-pay | Admitting: *Deleted

## 2019-05-29 NOTE — Telephone Encounter (Signed)
Received a message from [facility] IR that they will not have an appt available until 6/30 and the patient has chemotherapy scheduled prior to that. She recommended contacting [facility] for availability. Spoke with Caryl Pina [facility] IR and scheduled patient appointment for port placement. Contacted patient and made aware of port placement scheduled for 6/17 at 7 am at [facility], nothing to eat or drink after midnight and can take his morning medications with sips of water, and that he will need a driver to remain in the car and wait for him. Patient verbalized instructions and appointment time back to this RN. Left follow up message with Caryl Pina [facility] IR that patient will need [place] interpreter for appt. Also received a phone call from patient interpreter Fredrich Birks [phone] requesting information ahead of chemo education course prior to appt and this information was communicated to [facility]. Also communicated port placement appt to [facility] to follow up with patient as well. Patient was provided with contact number [phone] if he has any questions or concerns and patient repeated back.

## 2019-05-29 NOTE — Telephone Encounter (Signed)
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  Prepared packet of information on Cisplatin & Gemzar & usual Edu materials for telephone pt EDU teaching tomorrow.  She will p/u today & hopefully give to pt at next visit.

## 2019-05-30 ENCOUNTER — Other Ambulatory Visit: Payer: Self-pay | Admitting: Student

## 2019-05-30 ENCOUNTER — Inpatient Hospital Stay: Payer: Medicare Other

## 2019-05-31 ENCOUNTER — Other Ambulatory Visit: Payer: Self-pay | Admitting: Oncology

## 2019-05-31 ENCOUNTER — Other Ambulatory Visit: Payer: Self-pay

## 2019-05-31 ENCOUNTER — Ambulatory Visit (HOSPITAL_COMMUNITY)
Admission: RE | Admit: 2019-05-31 | Discharge: 2019-05-31 | Disposition: A | Discharge status: Home / Self Care | Payer: Medicare Other | Source: Ambulatory Visit | Attending: Oncology | Admitting: Oncology

## 2019-05-31 ENCOUNTER — Encounter (HOSPITAL_COMMUNITY): Payer: Self-pay

## 2019-05-31 DIAGNOSIS — C679 Malignant neoplasm of bladder, unspecified: Secondary | ICD-10-CM | POA: Diagnosis present

## 2019-05-31 HISTORY — PX: IR IMAGING GUIDED PORT INSERTION: IMG5740

## 2019-05-31 LAB — CBC
HCT: 45.9 % (ref 39.0–52.0)
Hemoglobin: 15.1 g/dL (ref 13.0–17.0)
MCH: 29.7 pg (ref 26.0–34.0)
MCHC: 32.9 g/dL (ref 30.0–36.0)
MCV: 90.4 fL (ref 80.0–100.0)
Platelets: 299 10*3/uL (ref 150–400)
RBC: 5.08 MIL/uL (ref 4.22–5.81)
RDW: 12.6 % (ref 11.5–15.5)
WBC: 4.8 10*3/uL (ref 4.0–10.5)
nRBC: 0 % (ref 0.0–0.2)

## 2019-05-31 LAB — PROTIME-INR
INR: 1.1 (ref 0.8–1.2)
Prothrombin Time: 14.1 seconds (ref 11.4–15.2)

## 2019-05-31 MED ORDER — HEPARIN SOD (PORK) LOCK FLUSH 100 UNIT/ML IV SOLN
INTRAVENOUS | Status: AC
  Filled 2019-05-31: qty 5

## 2019-05-31 MED ORDER — LIDOCAINE HCL (PF) 1 % IJ SOLN
INTRAMUSCULAR | Status: AC | PRN
  Administered 2019-05-31: 10 mL

## 2019-05-31 MED ORDER — LIDOCAINE HCL 1 % IJ SOLN
INTRAMUSCULAR | Status: AC
  Filled 2019-05-31: qty 20

## 2019-05-31 MED ORDER — MIDAZOLAM HCL 2 MG/2ML IJ SOLN
INTRAMUSCULAR | Status: AC | PRN
  Administered 2019-05-31: 1 mg via INTRAVENOUS

## 2019-05-31 MED ORDER — CEFAZOLIN SODIUM-DEXTROSE 2-4 GM/100ML-% IV SOLN
2.0000 g | Freq: Once | INTRAVENOUS | Status: AC
  Administered 2019-05-31: 2 g via INTRAVENOUS

## 2019-05-31 MED ORDER — FENTANYL CITRATE (PF) 100 MCG/2ML IJ SOLN
INTRAMUSCULAR | Status: AC
  Filled 2019-05-31: qty 2

## 2019-05-31 MED ORDER — SODIUM CHLORIDE 0.9 % IV SOLN
INTRAVENOUS | Status: DC
  Administered 2019-05-31: 09:00:00 via INTRAVENOUS

## 2019-05-31 MED ORDER — FENTANYL CITRATE (PF) 100 MCG/2ML IJ SOLN
INTRAMUSCULAR | Status: AC | PRN
  Administered 2019-05-31: 50 ug via INTRAVENOUS

## 2019-05-31 MED ORDER — CEFAZOLIN SODIUM-DEXTROSE 2-4 GM/100ML-% IV SOLN
INTRAVENOUS | Status: AC
  Administered 2019-05-31: 2 g via INTRAVENOUS
  Filled 2019-05-31: qty 100

## 2019-05-31 MED ORDER — MIDAZOLAM HCL 2 MG/2ML IJ SOLN
INTRAMUSCULAR | Status: AC
  Filled 2019-05-31: qty 2

## 2019-05-31 NOTE — Discharge Instructions (Signed)
Moderate Conscious Sedation, Adult, Care After  These instructions provide you with information about caring for yourself after your procedure. Your health care provider may also give you more specific instructions. Your treatment has been planned according to current medical practices, but problems sometimes occur. Call your health care provider if you have any problems or questions after your procedure.  What can I expect after the procedure?  After your procedure, it is common:  · To feel sleepy for several hours.  · To feel clumsy and have poor balance for several hours.  · To have poor judgment for several hours.  · To vomit if you eat too soon.  Follow these instructions at home:  For at least 24 hours after the procedure:    · Do not:  ? Participate in activities where you could fall or become injured.  ? Drive.  ? Use heavy machinery.  ? Drink alcohol.  ? Take sleeping pills or medicines that cause drowsiness.  ? Make important decisions or sign legal documents.  ? Take care of children on your own.  · Rest.  Eating and drinking  · Follow the diet recommended by your health care provider.  · If you vomit:  ? Drink water, juice, or soup when you can drink without vomiting.  ? Make sure you have little or no nausea before eating solid foods.  General instructions  · Have a responsible adult stay with you until you are awake and alert.  · Take over-the-counter and prescription medicines only as told by your health care provider.  · If you smoke, do not smoke without supervision.  · Keep all follow-up visits as told by your health care provider. This is important.  Contact a health care provider if:  · You keep feeling nauseous or you keep vomiting.  · You feel light-headed.  · You develop a rash.  · You have a fever.  Get help right away if:  · You have trouble breathing.  This information is not intended to replace advice given to you by your health care provider. Make sure you discuss any questions you have  with your health care provider.  Document Released: 09/20/2013 Document Revised: 05/04/2016 Document Reviewed: 03/21/2016  Elsevier Interactive Patient Education © 2019 Elsevier Inc.  Implanted Port Insertion, Care After  This sheet gives you information about how to care for yourself after your procedure. Your health care provider may also give you more specific instructions. If you have problems or questions, contact your health care provider.  What can I expect after the procedure?  After the procedure, it is common to have:  · Discomfort at the port insertion site.  · Bruising on the skin over the port. This should improve over 3-4 days.  Follow these instructions at home:  Port care  · After your port is placed, you will get a manufacturer's information card. The card has information about your port. Keep this card with you at all times.  · Take care of the port as told by your health care provider. Ask your health care provider if you or a family member can get training for taking care of the port at home. A home health care nurse may also take care of the port.  · Make sure to remember what type of port you have.  Incision care         · Follow instructions from your health care provider about how to take care of your port insertion   site. Make sure you:  ? Wash your hands with soap and water before and after you change your bandage (dressing). If soap and water are not available, use hand sanitizer.  ? Change your dressing as told by your health care provider.  ? Leave stitches (sutures), skin glue, or adhesive strips in place. These skin closures may need to stay in place for 2 weeks or longer. If adhesive strip edges start to loosen and curl up, you may trim the loose edges. Do not remove adhesive strips completely unless your health care provider tells you to do that.  · Check your port insertion site every day for signs of infection. Check for:  ? Redness, swelling, or pain.  ? Fluid or  blood.  ? Warmth.  ? Pus or a bad smell.  Activity  · Return to your normal activities as told by your health care provider. Ask your health care provider what activities are safe for you.  · Do not lift anything that is heavier than 10 lb (4.5 kg), or the limit that you are told, until your health care provider says that it is safe.  General instructions  · Take over-the-counter and prescription medicines only as told by your health care provider.  · Do not take baths, swim, or use a hot tub until your health care provider approves. Ask your health care provider if you may take showers. You may only be allowed to take sponge baths.  · Do not drive for 24 hours if you were given a sedative during your procedure.  · Wear a medical alert bracelet in case of an emergency. This will tell any health care providers that you have a port.  · Keep all follow-up visits as told by your health care provider. This is important.  Contact a health care provider if:  · You cannot flush your port with saline as directed, or you cannot draw blood from the port.  · You have a fever or chills.  · You have redness, swelling, or pain around your port insertion site.  · You have fluid or blood coming from your port insertion site.  · Your port insertion site feels warm to the touch.  · You have pus or a bad smell coming from the port insertion site.  Get help right away if:  · You have chest pain or shortness of breath.  · You have bleeding from your port that you cannot control.  Summary  · Take care of the port as told by your health care provider. Keep the manufacturer's information card with you at all times.  · Change your dressing as told by your health care provider.  · Contact a health care provider if you have a fever or chills or if you have redness, swelling, or pain around your port insertion site.  · Keep all follow-up visits as told by your health care provider.  This information is not intended to replace advice given to  you by your health care provider. Make sure you discuss any questions you have with your health care provider.  Document Released: 09/20/2013 Document Revised: 06/28/2018 Document Reviewed: 06/28/2018  Elsevier Interactive Patient Education © 2019 Elsevier Inc.

## 2019-05-31 NOTE — Sedation Documentation (Addendum)
Pt in IR Room 1, supine on table, secured with strap.  Pt placed on 2L O2 via [place].  Pt on cont cardiac monitoring

## 2019-05-31 NOTE — H&P (Signed)
Chief Complaint: Patient was seen in consultation today for [facility] a cath placement at the request of Wyatt Portela  Referring Physician(s): Wyatt Portela  Supervising Physician: Aletta Edouard  Patient Status: [facility] - Out-pt  History of Present Illness: Robert Lowery is a 63 y.o. male   Known bladder cancer High grade papillary urothelial cancer Radical cystectomy   Developed cough in 4/202 Findings of new RUL nodule Bx 6/2:  Lung, needle/core biopsy(ies), RUL nodule - METASTATIC CARCINOMA CONSISTENT WITH UROTHELIAL CARCINOMA  Now scheduled for Port a cath and to start treatment Fri June 19   Past Medical History:  Diagnosis Date   Bladder cancer ([place]) 05/10/2018   TUR of the BLADDER...DR. Jeffie Pollock   Chest pain    at rest   Hearing loss    right ear   Hepatitis    history of Hep. B   History of hiatal hernia 05/30/2016   Small retrocardiac, noted on CT   Internal hemorrhoids    Liver lesion    Mild cardiomegaly    Scrotal nodule    Bilateral    Past Surgical History:  Procedure Laterality Date   BIOPSY TESTIS     bilateral testicle mass   COLONOSCOPY     CYSTOSCOPY/RETROGRADE/URETEROSCOPY N/A 05/10/2018   Procedure: BILATERAL RETROGRADE;  Surgeon: [name], MD;  Location: [facility] ORS;  Service: Urology;  Laterality: N/A;   TRANSURETHRAL RESECTION OF BLADDER TUMOR WITH MITOMYCIN-C N/A 05/10/2018   Procedure: CYSTOSCOPY TRANSURETHRAL RESECTION OF BLADDER TUMOR WITH MITOMYCIN-C;  Surgeon: [name], MD;  Location: [facility];  Service: Urology;  Laterality: N/A;   UPPER GI ENDOSCOPY      Allergies: Patient has no known allergies.  Medications: Prior to Admission medications   Medication Sig Start Date End Date Taking? Authorizing Provider  aspirin 81 MG chewable tablet Chew 1 tablet (81 mg total) by mouth daily. 05/16/18   Narang, Ronnell [place], MD  lidocaine-prilocaine (EMLA) cream Apply 1 application topically as needed. 05/25/19   Wyatt Portela, MD    prochlorperazine (COMPAZINE) 10 MG tablet Take 1 tablet (10 mg total) by mouth every 6 (six) hours as needed for nausea or vomiting. 05/25/19   Wyatt Portela, MD     History reviewed. No pertinent family history.  Social History   Socioeconomic History   Marital status: Married    Spouse name: Not on file   Number of children: Not on file   Years of education: Not on file   Highest education level: Not on file  Occupational History   Not on file  Social Needs   Financial resource strain: Not on file   Food insecurity    Worry: Not on file    Inability: Not on file   Transportation needs    Medical: Not on file    Non-medical: Not on file  Tobacco Use   Smoking status: Former Smoker   Smokeless tobacco: Never Used  Substance and Sexual Activity   Alcohol use: Never    Frequency: Never   Drug use: Never   Sexual activity: Not on file  Lifestyle   Physical activity    Days per week: Not on file    Minutes per session: Not on file   Stress: Not on file  Relationships   Social connections    Talks on phone: Not on file    Gets together: Not on file    Attends religious service: Not on file    Active member of club  or organization: Not on file    Attends meetings of clubs or organizations: Not on file    Relationship status: Not on file  Other Topics Concern   Not on file  Social History Narrative   Not on file    Review of Systems: A 12 point ROS discussed and pertinent positives are indicated in the HPI above.  All other systems are negative.  Review of Systems  Constitutional: Negative for activity change, fatigue and fever.  Respiratory: Positive for cough. Negative for shortness of breath.   Cardiovascular: Negative for chest pain.  Gastrointestinal: Negative for abdominal pain.  Neurological: Negative for weakness.  Psychiatric/Behavioral: Negative for behavioral problems and confusion.    Vital Signs: BP 130/76 (BP Location: Right  Arm)    Pulse (!) 56    Temp 97.8 F (36.6 C) (Oral)    Resp 16    Ht 5\' 4"  (1.626 m)    Wt 139 lb (63 kg)    SpO2 95%    BMI 23.86 kg/m   Physical Exam Vitals signs reviewed.  Constitutional:      Appearance: Normal appearance.  Cardiovascular:     Rate and Rhythm: Normal rate and regular rhythm.     Heart sounds: Normal heart sounds.  Pulmonary:     Effort: Pulmonary effort is normal.     Breath sounds: Normal breath sounds.  Abdominal:     Tenderness: There is no abdominal tenderness.  Musculoskeletal: Normal range of motion.  Skin:    General: Skin is warm and dry.  Neurological:     Mental Status: He is alert and oriented to person, place, and time.  Psychiatric:        Mood and Affect: Mood normal.        Behavior: Behavior normal.        Thought Content: Thought content normal.        Judgment: Judgment normal.     Imaging: Nm Pet Image Initial (pi) Skull Base To Thigh  Result Date: 05/09/2019 CLINICAL DATA:  Initial treatment strategy for pulmonary nodule. EXAM: NUCLEAR MEDICINE PET SKULL BASE TO THIGH TECHNIQUE: 7.1 mCi F-18 FDG was injected intravenously. Full-ring PET imaging was performed from the skull base to thigh after the radiotracer. CT data was obtained and used for attenuation correction and anatomic localization. Fasting blood glucose: 86 mg/dl COMPARISON:  CT chest 04/20/2019 FINDINGS: Mediastinal blood pool activity: SUV max 2.61 Liver activity: SUV max NA NECK: No hypermetabolic lymph nodes in the neck. Incidental CT findings: none CHEST: The large peripheral right upper lobe pulmonary nodule measures 2.1 cm and has an SUV max of 13.3. No additional hypermetabolic pulmonary nodules or masses identified. There are multiple calcified nodules identified within the right lung compatible with prior granulomatous disease. FDG avid right hilar node has an SUV max of 11.69. FDG avid left hilar lymph node has an SUV max of 6.48. Incidental CT findings: Calcified right  paratracheal and right hilar lymph nodes identified. Aortic atherosclerosis. ABDOMEN/PELVIS: Multiple low-density liver foci are identified without corresponding increased FDG uptake, likely benign cysts. No uptake within the pancreas, spleen, or adrenal glands. Aortic atherosclerosis. No hypermetabolic lymph nodes within the abdomen or pelvis. Incidental CT findings: Postoperative change from cystectomy and neobladder. SKELETON: No focal hypermetabolic activity to suggest skeletal metastasis. Incidental CT findings: none IMPRESSION: 1. The pulmonary nodule within the right upper lobe is intensely hypermetabolic and worrisome for either metastatic disease or primary bronchogenic carcinoma. 2. Bilateral hilar lymph nodes exhibit  moderate FDG uptake, right greater than left. Cannot rule out hilar lymph node metastasis. 3. No evidence for hypermetabolic tumor within the abdomen or pelvis. Electronically Signed   By: Kerby Moors M.D.   On: 05/09/2019 16:37   Ct Biopsy  Result Date: 05/16/2019 INDICATION: Bladder cancer, PET positive peripheral right upper lobe nodule EXAM: CT-GUIDED BIOPSY RIGHT UPPER LOBE NODULE MEDICATIONS: 1% lidocaine local ANESTHESIA/SEDATION: 1.0 mg IV Versed; 50 mcg IV Fentanyl Moderate Sedation Time:  19 minutes The patient was continuously monitored during the procedure by the interventional radiology nurse under my direct supervision. PROCEDURE: The procedure, risks, benefits, and alternatives were explained to the patient. Questions regarding the procedure were encouraged and answered. The patient understands and consents to the procedure. Interpreter was utilized. Previous imaging reviewed. Patient positioned supine. Noncontrast localization CT performed. The right upper lobe peripheral 2 cm nodule was localized. Overlying skin marked for a right lateral oblique approach. Under sterile conditions and local anesthesia, CT guidance was utilized to advance a 17 gauge 6.8 cm access needle  into the lesion. Needle position confirmed with CT. Three 1 cm 18 gauge core biopsies obtained. Samples placed in formalin. Needle tract occluded with the bio sentry device. Postprocedure imaging demonstrates a small amount of adjacent pulmonary hemorrhage from the biopsy but no effusion or pneumothorax. Patient tolerated the procedure well without complication. Vital sign monitoring by nursing staff during the procedure will continue as patient is in the special procedures unit for post procedure observation. FINDINGS: The images document guide needle placement within the right upper lobe lesion. Post biopsy images demonstrate no effusion or pneumothorax. COMPLICATIONS: None immediate. IMPRESSION: Successful CT-guided core biopsy of the right upper lobe nodule Electronically Signed   By: Jerilynn Mages.  Shick M.D.   On: 05/16/2019 12:03   Dg Chest Port 1 View  Result Date: 05/16/2019 CLINICAL DATA:  Pneumothorax after biopsy. EXAM: PORTABLE CHEST 1 VIEW COMPARISON:  Radiograph of same day. FINDINGS: The heart size and mediastinal contours are within normal limits. Grossly stable minimal right apical pneumothorax is noted. Stable right upper lobe lung mass is noted. Left lung is clear. The visualized skeletal structures are unremarkable. IMPRESSION: Grossly stable minimal right apical pneumothorax is noted. Electronically Signed   By: Marijo Conception M.D.   On: 05/16/2019 14:25   Dg Chest Port 1 View  Result Date: 05/16/2019 CLINICAL DATA:  63 year old male status post right upper lobe pulmonary nodule biopsy. EXAM: PORTABLE CHEST 1 VIEW COMPARISON:  Lung biopsy images obtained earlier today. FINDINGS: Trace right apical pneumothorax, less than 5%. Stable right upper lobe pulmonary nodule. Stable calcified pulmonary nodules, mediastinal and right hilar lymph nodes. Cardiac and mediastinal contours remain within normal limits. No pleural effusion. No acute osseous abnormality. IMPRESSION: 1. Tiny right apical pneumothorax  accounting for less than 5% volume loss. 2. Stable right upper lobe pulmonary nodule. Electronically Signed   By: Jacqulynn Cadet M.D.   On: 05/16/2019 13:17    Labs:  CBC: Recent Labs    05/16/19 0947 05/31/19 0706  WBC 4.5 4.8  HGB 14.9 15.1  HCT 45.6 45.9  PLT 237 299    COAGS: Recent Labs    05/16/19 0947 05/31/19 0706  INR 1.0 1.1    BMP: No results for input(s): NA, K, CL, CO2, GLUCOSE, BUN, CALCIUM, CREATININE, GFRNONAA, GFRAA in the last 8760 hours.  Invalid input(s): CMP  LIVER FUNCTION TESTS: No results for input(s): BILITOT, AST, ALT, ALKPHOS, PROT, ALBUMIN in the last 8760 hours.  TUMOR MARKERS: No results for input(s): AFPTM, CEA, CA199, CHROMGRNA in the last 8760 hours.  Assessment and Plan:  Known bladder/urothelail cancer 2019 New RUL nodule: + metastatic - urothelial cancer To start treatments Fri For [facility] placement today Risks and benefits of image guided port-a-catheter placement was discussed with the patient including, but not limited to bleeding, infection, pneumothorax, or fibrin sheath development and need for additional procedures.  All of the patient's questions were answered, patient is agreeable to proceed. Consent signed and in chart.   Thank you for this interesting consult.  I greatly enjoyed meeting [facility] and look forward to participating in their care.  A copy of this report was sent to the requesting provider on this date.  Electronically Signed: Lavonia Drafts, PA-C 05/31/2019, 8:51 AM   I spent a total of    25 Minutes in face to face in clinical consultation, greater than 50% of which was counseling/coordinating care for [facility] placement

## 2019-05-31 NOTE — Procedures (Signed)
Interventional Radiology Procedure Note  Procedure: Single Lumen Power Port Placement    Access:  Right IJ vein.  Findings: Catheter tip positioned at SVC/RA junction. Port is ready for immediate use.   Complications: None  EBL: < 10 mL  Recommendations:  - Ok to shower in 24 hours - Do not submerge for 7 days - Routine line care   Sadik Piascik T. Dashana Guizar, M.D Pager:  [phone]   

## 2019-06-02 ENCOUNTER — Inpatient Hospital Stay: Payer: Medicare Other

## 2019-06-02 ENCOUNTER — Other Ambulatory Visit: Payer: Self-pay

## 2019-06-02 VITALS — BP 126/73 | HR 56 | Temp 98.7°F | Resp 18

## 2019-06-02 DIAGNOSIS — C679 Malignant neoplasm of bladder, unspecified: Secondary | ICD-10-CM | POA: Diagnosis not present

## 2019-06-02 DIAGNOSIS — Z95828 Presence of other vascular implants and grafts: Secondary | ICD-10-CM

## 2019-06-02 LAB — CBC WITH DIFFERENTIAL (CANCER CENTER ONLY)
Abs Immature Granulocytes: 0 10*3/uL (ref 0.00–0.07)
Basophils Absolute: 0 10*3/uL (ref 0.0–0.1)
Basophils Relative: 0 %
Eosinophils Absolute: 0.1 10*3/uL (ref 0.0–0.5)
Eosinophils Relative: 1 %
HCT: 44 % (ref 39.0–52.0)
Hemoglobin: 14.4 g/dL (ref 13.0–17.0)
Immature Granulocytes: 0 %
Lymphocytes Relative: 50 %
Lymphs Abs: 2.2 10*3/uL (ref 0.7–4.0)
MCH: 29.3 pg (ref 26.0–34.0)
MCHC: 32.7 g/dL (ref 30.0–36.0)
MCV: 89.4 fL (ref 80.0–100.0)
Monocytes Absolute: 0.3 10*3/uL (ref 0.1–1.0)
Monocytes Relative: 7 %
Neutro Abs: 2 10*3/uL (ref 1.7–7.7)
Neutrophils Relative %: 42 %
Platelet Count: 263 10*3/uL (ref 150–400)
RBC: 4.92 MIL/uL (ref 4.22–5.81)
RDW: 12.7 % (ref 11.5–15.5)
WBC Count: 4.6 10*3/uL (ref 4.0–10.5)
nRBC: 0 % (ref 0.0–0.2)

## 2019-06-02 LAB — CMP (CANCER CENTER ONLY)
ALT: 18 U/L (ref 0–44)
AST: 22 U/L (ref 15–41)
Albumin: 4 g/dL (ref 3.5–5.0)
Alkaline Phosphatase: 44 U/L (ref 38–126)
Anion gap: 8 (ref 5–15)
BUN: 9 mg/dL (ref 8–23)
CO2: 24 mmol/L (ref 22–32)
Calcium: 8.6 mg/dL — ABNORMAL LOW (ref 8.9–10.3)
Chloride: 106 mmol/L (ref 98–111)
Creatinine: 0.79 mg/dL (ref 0.61–1.24)
GFR, Est AFR Am: >60 mL/min (ref >60)
GFR, Estimated: >60 mL/min (ref >60)
Glucose, Bld: 78 mg/dL (ref 70–99)
Potassium: 4.5 mmol/L (ref 3.5–5.1)
Sodium: 138 mmol/L (ref 135–145)
Total Bilirubin: 0.5 mg/dL (ref 0.3–1.2)
Total Protein: 7.2 g/dL (ref 6.5–8.1)

## 2019-06-02 MED ORDER — PALONOSETRON HCL INJECTION 0.25 MG/5ML
INTRAVENOUS | Status: AC
  Filled 2019-06-02: qty 5

## 2019-06-02 MED ORDER — SODIUM CHLORIDE 0.9 % IV SOLN
70.0000 mg/m2 | Freq: Once | INTRAVENOUS | Status: AC
  Administered 2019-06-02: 13:00:00 119 mg via INTRAVENOUS
  Filled 2019-06-02: qty 119

## 2019-06-02 MED ORDER — SODIUM CHLORIDE 0.9 % IV SOLN
1000.0000 mg/m2 | Freq: Once | INTRAVENOUS | Status: AC
  Administered 2019-06-02: 12:00:00 1710 mg via INTRAVENOUS
  Filled 2019-06-02: qty 44.97

## 2019-06-02 MED ORDER — HEPARIN SOD (PORK) LOCK FLUSH 100 UNIT/ML IV SOLN
500.0000 [IU] | Freq: Once | INTRAVENOUS | Status: AC | PRN
  Administered 2019-06-02: 500 [IU]
  Filled 2019-06-02: qty 5

## 2019-06-02 MED ORDER — SODIUM CHLORIDE 0.9 % IV SOLN
Freq: Once | INTRAVENOUS | Status: AC
  Administered 2019-06-02: 09:00:00 via INTRAVENOUS
  Filled 2019-06-02: qty 250

## 2019-06-02 MED ORDER — SODIUM CHLORIDE 0.9% FLUSH
10.0000 mL | INTRAVENOUS | Status: DC | PRN
  Administered 2019-06-02: 10 mL via INTRAVENOUS
  Filled 2019-06-02: qty 10

## 2019-06-02 MED ORDER — POTASSIUM CHLORIDE 2 MEQ/ML IV SOLN
Freq: Once | INTRAVENOUS | Status: AC
  Administered 2019-06-02: 10:00:00 via INTRAVENOUS
  Filled 2019-06-02: qty 10

## 2019-06-02 MED ORDER — SODIUM CHLORIDE 0.9% FLUSH
10.0000 mL | INTRAVENOUS | Status: DC | PRN
  Administered 2019-06-02: 10 mL
  Filled 2019-06-02: qty 10

## 2019-06-02 MED ORDER — SODIUM CHLORIDE 0.9 % IV SOLN
Freq: Once | INTRAVENOUS | Status: AC
  Administered 2019-06-02: 11:00:00 via INTRAVENOUS
  Filled 2019-06-02: qty 5

## 2019-06-02 MED ORDER — PALONOSETRON HCL INJECTION 0.25 MG/5ML
0.2500 mg | Freq: Once | INTRAVENOUS | Status: AC
  Administered 2019-06-02: 0.25 mg via INTRAVENOUS

## 2019-06-02 NOTE — Progress Notes (Signed)
Stopped by infusion room & spoke with pt & interpreter Summi & asked if they had any questions.  Pt reports none.  Spoke with son, Tedra [place] & clarified pt's schedule.

## 2019-06-02 NOTE — Progress Notes (Signed)
Consent was obtained for Gemzar/Cisplatin. Patient's translator was present during the process.

## 2019-06-02 NOTE — Patient Instructions (Signed)
[facility] Discharge Instructions for Patients Receiving Chemotherapy  Today you received the following chemotherapy agents: Gemzar and Cisplatin   To help prevent nausea and vomiting after your treatment, we encourage you to take your nausea medication as directed.   If you develop nausea and vomiting that is not controlled by your nausea medication, call the clinic.   BELOW ARE SYMPTOMS THAT SHOULD BE REPORTED IMMEDIATELY:  *FEVER GREATER THAN 100.5 F  *CHILLS WITH OR WITHOUT FEVER  NAUSEA AND VOMITING THAT IS NOT CONTROLLED WITH YOUR NAUSEA MEDICATION  *UNUSUAL SHORTNESS OF BREATH  *UNUSUAL BRUISING OR BLEEDING  TENDERNESS IN MOUTH AND THROAT WITH OR WITHOUT PRESENCE OF ULCERS  *URINARY PROBLEMS  *BOWEL PROBLEMS  UNUSUAL RASH Items with * indicate a potential emergency and should be followed up as soon as possible.  Feel free to call the clinic should you have any questions or concerns. The clinic phone number is [phone].  Please show the [facility] at check-in to the Emergency Department and triage nurse.   Gemcitabine injection What is this medicine? GEMCITABINE (jem SYE ta been) is a chemotherapy drug. This medicine is used to treat many types of cancer like breast cancer, lung cancer, pancreatic cancer, and ovarian cancer. This medicine may be used for other purposes; ask your health care provider or pharmacist if you have questions. COMMON BRAND NAME(S): Gemzar, Infugem What should I tell my health care provider before I take this medicine? They need to know if you have any of these conditions: -blood disorders -infection -kidney disease -liver disease -lung or breathing disease, like asthma -recent or ongoing radiation therapy -an unusual or allergic reaction to gemcitabine, other chemotherapy, other medicines, foods, dyes, or preservatives -pregnant or trying to get pregnant -breast-feeding How should I use this medicine? This  drug is given as an infusion into a vein. It is administered in a hospital or clinic by a specially trained health care professional. Talk to your pediatrician regarding the use of this medicine in children. Special care may be needed. Overdosage: If you think you have taken too much of this medicine contact a poison control center or emergency room at once. NOTE: This medicine is only for you. Do not share this medicine with others. What if I miss a dose? It is important not to miss your dose. Call your doctor or health care professional if you are unable to keep an appointment. What may interact with this medicine? -medicines to increase blood counts like filgrastim, pegfilgrastim, sargramostim -some other chemotherapy drugs like cisplatin -vaccines Talk to your doctor or health care professional before taking any of these medicines: -acetaminophen -aspirin -ibuprofen -ketoprofen -naproxen This list may not describe all possible interactions. Give your health care provider a list of all the medicines, herbs, non-prescription drugs, or dietary supplements you use. Also tell them if you smoke, drink alcohol, or use illegal drugs. Some items may interact with your medicine. What should I watch for while using this medicine? Visit your doctor for checks on your progress. This drug may make you feel generally unwell. This is not uncommon, as chemotherapy can affect healthy cells as well as cancer cells. Report any side effects. Continue your course of treatment even though you feel ill unless your doctor tells you to stop. In some cases, you may be given additional medicines to help with side effects. Follow all directions for their use. Call your doctor or health care professional for advice if you get a fever,  chills or sore throat, or other symptoms of a cold or flu. Do not treat yourself. This drug decreases your body's ability to fight infections. Try to avoid being around people who are  sick. This medicine may increase your risk to bruise or bleed. Call your doctor or health care professional if you notice any unusual bleeding. Be careful brushing and flossing your teeth or using a toothpick because you may get an infection or bleed more easily. If you have any dental work done, tell your dentist you are receiving this medicine. Avoid taking products that contain aspirin, acetaminophen, ibuprofen, naproxen, or ketoprofen unless instructed by your doctor. These medicines may hide a fever. Do not become pregnant while taking this medicine or for 6 months after stopping it. Women should inform their doctor if they wish to become pregnant or think they might be pregnant. Men should not father a child while taking this medicine and for 3 months after stopping it. There is a potential for serious side effects to an unborn child. Talk to your health care professional or pharmacist for more information. Do not breast-feed an infant while taking this medicine or for at least 1 week after stopping it. Men should inform their doctors if they wish to father a child. This medicine may lower sperm counts. Talk with your doctor or health care professional if you are concerned about your fertility. What side effects may I notice from receiving this medicine? Side effects that you should report to your doctor or health care professional as soon as possible: -allergic reactions like skin rash, itching or hives, swelling of the face, lips, or tongue -breathing problems -pain, redness, or irritation at site where injected -signs and symptoms of a dangerous change in heartbeat or heart rhythm like chest pain; dizziness; fast or irregular heartbeat; palpitations; feeling faint or lightheaded, falls; breathing problems -signs of decreased platelets or bleeding - bruising, pinpoint red spots on the skin, black, tarry stools, blood in the urine -signs of decreased red blood cells - unusually weak or tired,  feeling faint or lightheaded, falls -signs of infection - fever or chills, cough, sore throat, pain or difficulty passing urine -signs and symptoms of kidney injury like trouble passing urine or change in the amount of urine -signs and symptoms of liver injury like dark yellow or brown urine; general ill feeling or flu-like symptoms; light-colored stools; loss of appetite; nausea; right upper belly pain; unusually weak or tired; yellowing of the eyes or skin -swelling of ankles, feet, hands Side effects that usually do not require medical attention (report to your doctor or health care professional if they continue or are bothersome): -constipation -diarrhea -hair loss -loss of appetite -nausea -rash -vomiting This list may not describe all possible side effects. Call your doctor for medical advice about side effects. You may report side effects to FDA at 1-800-FDA-1088. Where should I keep my medicine? This drug is given in a hospital or clinic and will not be stored at home. NOTE: This sheet is a summary. It may not cover all possible information. If you have questions about this medicine, talk to your doctor, pharmacist, or health care provider.  2019 Elsevier/Gold Standard (2018-02-23 18:06:11)  Cisplatin injection What is this medicine? CISPLATIN (SIS pla tin) is a chemotherapy drug. It targets fast dividing cells, like cancer cells, and causes these cells to die. This medicine is used to treat many types of cancer like bladder, ovarian, and testicular cancers. This medicine may be used for  other purposes; ask your health care provider or pharmacist if you have questions. COMMON BRAND NAME(S): Platinol, Platinol -AQ What should I tell my health care provider before I take this medicine? They need to know if you have any of these conditions: -blood disorders -hearing problems -kidney disease -recent or ongoing radiation therapy -an unusual or allergic reaction to cisplatin,  carboplatin, other chemotherapy, other medicines, foods, dyes, or preservatives -pregnant or trying to get pregnant -breast-feeding How should I use this medicine? This drug is given as an infusion into a vein. It is administered in a hospital or clinic by a specially trained health care professional. Talk to your pediatrician regarding the use of this medicine in children. Special care may be needed. Overdosage: If you think you have taken too much of this medicine contact a poison control center or emergency room at once. NOTE: This medicine is only for you. Do not share this medicine with others. What if I miss a dose? It is important not to miss a dose. Call your doctor or health care professional if you are unable to keep an appointment. What may interact with this medicine? -dofetilide -foscarnet -medicines for seizures -medicines to increase blood counts like filgrastim, pegfilgrastim, sargramostim -probenecid -pyridoxine used with altretamine -rituximab -some antibiotics like amikacin, gentamicin, neomycin, polymyxin B, streptomycin, tobramycin -sulfinpyrazone -vaccines -zalcitabine Talk to your doctor or health care professional before taking any of these medicines: -acetaminophen -aspirin -ibuprofen -ketoprofen -naproxen This list may not describe all possible interactions. Give your health care provider a list of all the medicines, herbs, non-prescription drugs, or dietary supplements you use. Also tell them if you smoke, drink alcohol, or use illegal drugs. Some items may interact with your medicine. What should I watch for while using this medicine? Your condition will be monitored carefully while you are receiving this medicine. You will need important blood work done while you are taking this medicine. This drug may make you feel generally unwell. This is not uncommon, as chemotherapy can affect healthy cells as well as cancer cells. Report any side effects. Continue  your course of treatment even though you feel ill unless your doctor tells you to stop. In some cases, you may be given additional medicines to help with side effects. Follow all directions for their use. Call your doctor or health care professional for advice if you get a fever, chills or sore throat, or other symptoms of a cold or flu. Do not treat yourself. This drug decreases your body's ability to fight infections. Try to avoid being around people who are sick. This medicine may increase your risk to bruise or bleed. Call your doctor or health care professional if you notice any unusual bleeding. Be careful brushing and flossing your teeth or using a toothpick because you may get an infection or bleed more easily. If you have any dental work done, tell your dentist you are receiving this medicine. Avoid taking products that contain aspirin, acetaminophen, ibuprofen, naproxen, or ketoprofen unless instructed by your doctor. These medicines may hide a fever. Do not become pregnant while taking this medicine. Women should inform their doctor if they wish to become pregnant or think they might be pregnant. There is a potential for serious side effects to an unborn child. Talk to your health care professional or pharmacist for more information. Do not breast-feed an infant while taking this medicine. Drink fluids as directed while you are taking this medicine. This will help protect your kidneys. Call your doctor or  health care professional if you get diarrhea. Do not treat yourself. What side effects may I notice from receiving this medicine? Side effects that you should report to your doctor or health care professional as soon as possible: -allergic reactions like skin rash, itching or hives, swelling of the face, lips, or tongue -signs of infection - fever or chills, cough, sore throat, pain or difficulty passing urine -signs of decreased platelets or bleeding - bruising, pinpoint red spots on the  skin, black, tarry stools, nosebleeds -signs of decreased red blood cells - unusually weak or tired, fainting spells, lightheadedness -breathing problems -changes in hearing -gout pain -low blood counts - This drug may decrease the number of white blood cells, red blood cells and platelets. You may be at increased risk for infections and bleeding. -nausea and vomiting -pain, swelling, redness or irritation at the injection site -pain, tingling, numbness in the hands or feet -problems with balance, movement -trouble passing urine or change in the amount of urine Side effects that usually do not require medical attention (report to your doctor or health care professional if they continue or are bothersome): -changes in vision -loss of appetite -metallic taste in the mouth or changes in taste This list may not describe all possible side effects. Call your doctor for medical advice about side effects. You may report side effects to FDA at 1-800-FDA-1088. Where should I keep my medicine? This drug is given in a hospital or clinic and will not be stored at home. NOTE: This sheet is a summary. It may not cover all possible information. If you have questions about this medicine, talk to your doctor, pharmacist, or health care provider.  2019 Elsevier/Gold Standard (2008-03-06 14:40:54)   Coronavirus (COVID-19) Are you at risk?  Are you at risk for the Coronavirus (COVID-19)?  To be considered HIGH RISK for Coronavirus (COVID-19), you have to meet the following criteria:  . Traveled to [place], [place], [place], [place] or [place]; or in the [place] to [place], [place], [place], or [place]; and have fever, cough, and shortness of breath within the last 2 weeks of travel OR . Been in close contact with a person diagnosed with COVID-19 within the last 2 weeks and have fever, cough, and shortness of breath . IF YOU DO NOT MEET THESE CRITERIA, YOU ARE CONSIDERED LOW RISK FOR  COVID-19.  What to do if you are HIGH RISK for COVID-19?  Marland Kitchen If you are having a medical emergency, call 911. . Seek medical care right away. Before you go to a doctor's office, urgent care or emergency department, call ahead and tell them about your recent travel, contact with someone diagnosed with COVID-19, and your symptoms. You should receive instructions from your physician's office regarding next steps of care.  . When you arrive at healthcare provider, tell the healthcare staff immediately you have returned from visiting [place], [place], [place], [place] or [place]; or traveled in the [place] to [place], [place], [place], or [place]; in the last two weeks or you have been in close contact with a person diagnosed with COVID-19 in the last 2 weeks.   . Tell the health care staff about your symptoms: fever, cough and shortness of breath. . After you have been seen by a medical provider, you will be either: o Tested for (COVID-19) and discharged home on quarantine except to seek medical care if symptoms worsen, and asked to  - Stay home and avoid contact with  others until you get your results (4-5 days)  - Avoid travel on public transportation if possible (such as bus, train, or airplane) or o Sent to the Emergency Department by EMS for evaluation, COVID-19 testing, and possible admission depending on your condition and test results.  What to do if you are LOW RISK for COVID-19?  Reduce your risk of any infection by using the same precautions used for avoiding the common cold or flu:  Marland Kitchen Wash your hands often with soap and warm water for at least 20 seconds.  If soap and water are not readily available, use an alcohol-based hand sanitizer with at least 60% alcohol.  . If coughing or sneezing, cover your mouth and nose by coughing or sneezing into the elbow areas of your shirt or coat, into a tissue or into your sleeve (not your hands). . Avoid shaking hands with others and consider  head nods or verbal greetings only. . Avoid touching your eyes, nose, or mouth with unwashed hands.  . Avoid close contact with people who are sick. . Avoid places or events with large numbers of people in one location, like concerts or sporting events. . Carefully consider travel plans you have or are making. . If you are planning any travel outside or inside the [place], visit the CDC's Travelers' Health webpage for the latest health notices. . If you have some symptoms but not all symptoms, continue to monitor at home and seek medical attention if your symptoms worsen. . If you are having a medical emergency, call 911.   [facility] / e-Visit: [email or web address]         [facility]: [facility]: [phone]                   [facility]: [phone]

## 2019-06-08 ENCOUNTER — Other Ambulatory Visit: Payer: Self-pay

## 2019-06-08 ENCOUNTER — Other Ambulatory Visit: Payer: Self-pay | Admitting: Oncology

## 2019-06-08 ENCOUNTER — Telehealth: Payer: Self-pay | Admitting: Oncology

## 2019-06-08 ENCOUNTER — Inpatient Hospital Stay: Payer: Medicare Other

## 2019-06-08 ENCOUNTER — Inpatient Hospital Stay (HOSPITAL_BASED_OUTPATIENT_CLINIC_OR_DEPARTMENT_OTHER): Payer: Medicare Other | Admitting: Oncology

## 2019-06-08 VITALS — BP 140/79 | HR 63 | Temp 98.5°F | Resp 17 | Ht 64.0 in | Wt 138.1 lb

## 2019-06-08 DIAGNOSIS — C670 Malignant neoplasm of trigone of bladder: Secondary | ICD-10-CM

## 2019-06-08 DIAGNOSIS — C7800 Secondary malignant neoplasm of unspecified lung: Secondary | ICD-10-CM

## 2019-06-08 DIAGNOSIS — C679 Malignant neoplasm of bladder, unspecified: Secondary | ICD-10-CM

## 2019-06-08 DIAGNOSIS — I700 Atherosclerosis of aorta: Secondary | ICD-10-CM

## 2019-06-08 DIAGNOSIS — Z7982 Long term (current) use of aspirin: Secondary | ICD-10-CM | POA: Diagnosis not present

## 2019-06-08 DIAGNOSIS — Z95828 Presence of other vascular implants and grafts: Secondary | ICD-10-CM | POA: Insufficient documentation

## 2019-06-08 DIAGNOSIS — Z87891 Personal history of nicotine dependence: Secondary | ICD-10-CM

## 2019-06-08 DIAGNOSIS — Z79899 Other long term (current) drug therapy: Secondary | ICD-10-CM

## 2019-06-08 LAB — CMP (CANCER CENTER ONLY)
ALT: 182 U/L — ABNORMAL HIGH (ref 0–44)
AST: 138 U/L — ABNORMAL HIGH (ref 15–41)
Albumin: 4 g/dL (ref 3.5–5.0)
Alkaline Phosphatase: 68 U/L (ref 38–126)
Anion gap: 8 (ref 5–15)
BUN: 15 mg/dL (ref 8–23)
CO2: 27 mmol/L (ref 22–32)
Calcium: 8.7 mg/dL — ABNORMAL LOW (ref 8.9–10.3)
Chloride: 101 mmol/L (ref 98–111)
Creatinine: 0.83 mg/dL (ref 0.61–1.24)
GFR, Est AFR Am: >60 mL/min (ref >60)
GFR, Estimated: >60 mL/min (ref >60)
Glucose, Bld: 88 mg/dL (ref 70–99)
Potassium: 3.9 mmol/L (ref 3.5–5.1)
Sodium: 136 mmol/L (ref 135–145)
Total Bilirubin: 0.7 mg/dL (ref 0.3–1.2)
Total Protein: 7 g/dL (ref 6.5–8.1)

## 2019-06-08 LAB — CBC WITH DIFFERENTIAL (CANCER CENTER ONLY)
Abs Immature Granulocytes: 0 10*3/uL (ref 0.00–0.07)
Basophils Absolute: 0 10*3/uL (ref 0.0–0.1)
Basophils Relative: 0 %
Eosinophils Absolute: 0 10*3/uL (ref 0.0–0.5)
Eosinophils Relative: 1 %
HCT: 41.8 % (ref 39.0–52.0)
Hemoglobin: 13.7 g/dL (ref 13.0–17.0)
Immature Granulocytes: 0 %
Lymphocytes Relative: 56 %
Lymphs Abs: 1.5 10*3/uL (ref 0.7–4.0)
MCH: 29 pg (ref 26.0–34.0)
MCHC: 32.8 g/dL (ref 30.0–36.0)
MCV: 88.4 fL (ref 80.0–100.0)
Monocytes Absolute: 0.1 10*3/uL (ref 0.1–1.0)
Monocytes Relative: 2 %
Neutro Abs: 1.1 10*3/uL — ABNORMAL LOW (ref 1.7–7.7)
Neutrophils Relative %: 41 %
Platelet Count: 200 10*3/uL (ref 150–400)
RBC: 4.73 MIL/uL (ref 4.22–5.81)
RDW: 12.3 % (ref 11.5–15.5)
WBC Count: 2.7 10*3/uL — ABNORMAL LOW (ref 4.0–10.5)
nRBC: 0 % (ref 0.0–0.2)

## 2019-06-08 MED ORDER — SODIUM CHLORIDE 0.9% FLUSH
10.0000 mL | INTRAVENOUS | Status: DC | PRN
  Administered 2019-06-08: 12:00:00 10 mL
  Filled 2019-06-08: qty 10

## 2019-06-08 MED ORDER — PROCHLORPERAZINE MALEATE 10 MG PO TABS
ORAL_TABLET | ORAL | Status: AC
  Filled 2019-06-08: qty 1

## 2019-06-08 MED ORDER — HEPARIN SOD (PORK) LOCK FLUSH 100 UNIT/ML IV SOLN
500.0000 [IU] | Freq: Once | INTRAVENOUS | Status: AC | PRN
  Administered 2019-06-08: 500 [IU]
  Filled 2019-06-08: qty 5

## 2019-06-08 MED ORDER — SODIUM CHLORIDE 0.9% FLUSH
10.0000 mL | Freq: Once | INTRAVENOUS | Status: AC
  Administered 2019-06-08: 10 mL
  Filled 2019-06-08: qty 10

## 2019-06-08 MED ORDER — SODIUM CHLORIDE 0.9 % IV SOLN
1000.0000 mg/m2 | Freq: Once | INTRAVENOUS | Status: AC
  Administered 2019-06-08: 12:00:00 1710 mg via INTRAVENOUS
  Filled 2019-06-08: qty 44.97

## 2019-06-08 MED ORDER — SODIUM CHLORIDE 0.9 % IV SOLN
Freq: Once | INTRAVENOUS | Status: AC
  Administered 2019-06-08: 11:00:00 via INTRAVENOUS
  Filled 2019-06-08: qty 250

## 2019-06-08 MED ORDER — PROCHLORPERAZINE MALEATE 10 MG PO TABS
10.0000 mg | ORAL_TABLET | Freq: Once | ORAL | Status: AC
  Administered 2019-06-08: 10 mg via ORAL

## 2019-06-08 NOTE — Telephone Encounter (Signed)
Scheduled appt per 6/25 sch message - pt to get an updated schedule next visit.

## 2019-06-08 NOTE — Progress Notes (Signed)
Hematology and Oncology Follow Up Visit  Robert Lowery [id number] 1955/12/31 63 y.o. 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   Prior Therapy:   He is status post cystectomy done in July 2019.  Surgery performed in [place]. He is status post lung biopsy completed on June 2 of 2020.   Current therapy: Gemcitabine and cisplatin chemotherapy cycle 1 started on 06/02/2019.  He is here for day 8 of cycle 1.    Interim History: [name] presents today for a follow-up visit.  Since the last visit, he received he first day of cycle 1 of chemotherapy utilizing gemcitabine and cisplatin without complications.  He denies any nausea, vomiting or peripheral neuropathy.  He did report some mild fatigue but recovered reasonably well.  He denies any recent hospitalizations or illnesses.  Port-A-Cath was inserted without any issues.  He does not report any headaches, blurry vision, syncope or seizures. Does not report any fevers, chills or sweats.  Does not report any cough, wheezing or hemoptysis.  Does not report any chest pain, palpitation, orthopnea or leg edema.  Does not report any nausea, vomiting or abdominal pain.  Does not report any constipation or diarrhea.  Does not report any skeletal complaints.    Does not report frequency, urgency or hematuria.  Does not report any skin rashes or lesions. Does not report any heat or cold intolerance.  Does not report any lymphadenopathy or petechiae.  Does not report any anxiety or depression.  Remaining review of systems is negative.    Medications: I have reviewed the patient's current medications.  Current Outpatient Medications  Medication Sig Dispense Refill  . aspirin 81 MG chewable tablet Chew 1 tablet (81 mg total) by mouth daily.    Marland Kitchen lidocaine-prilocaine (EMLA) cream Apply 1 application  topically as needed. 30 g 0  . prochlorperazine (COMPAZINE) 10 MG tablet Take 1 tablet (10 mg total) by mouth every 6 (six) hours as needed for nausea or vomiting. 30 tablet 0   No current facility-administered medications for this visit.      Allergies: No Known Allergies  Past Medical History, Surgical history, Social history, and Family History were reviewed and updated.   Physical Exam:  Blood pressure 140/79, pulse 63, temperature 98.5 F (36.9 C), temperature source Oral, resp. rate 17, height 5\' 4"  (1.626 m), weight 138 lb 1.6 oz (62.6 kg), SpO2 99 %.    ECOG:     General appearance: Comfortable appearing without any discomfort Head: Normocephalic without any trauma Oropharynx: Mucous membranes are moist and pink without any thrush or ulcers. Eyes: Pupils are equal and round reactive to light. Lymph nodes: No cervical, supraclavicular, inguinal or axillary lymphadenopathy.   Heart:regular rate and rhythm.  S1 and S2 without leg edema. Lung: Clear without any rhonchi or wheezes.  No dullness to percussion. Abdomin: Soft, nontender, nondistended with good bowel sounds.  No hepatosplenomegaly. Musculoskeletal: No joint deformity or effusion.  Full range of motion noted. Neurological: No deficits noted on motor, sensory and deep tendon reflex exam. Skin: No petechial rash or dryness.  Appeared moist.  Psychiatric: Mood and affect appeared appropriate.    Lab Results: Lab Results  Component Value Date   WBC 4.6 06/02/2019   HGB 14.4 06/02/2019   HCT 44.0 06/02/2019   MCV 89.4 06/02/2019   PLT 263 06/02/2019  Chemistry      Component Value Date/Time   NA 138 06/02/2019 0815   K 4.5 06/02/2019 0815   CL 106 06/02/2019 0815   CO2 24 06/02/2019 0815   BUN 9 06/02/2019 0815   CREATININE 0.79 06/02/2019 0815      Component Value Date/Time   CALCIUM 8.6 (L) 06/02/2019 0815   ALKPHOS 44 06/02/2019 0815   AST 22 06/02/2019 0815   ALT 18 06/02/2019 0815    BILITOT 0.5 06/02/2019 0815        Impression and Plan:   62 year old man with  1.    Urothelial carcinoma arising from the bladder diagnosed in 2019.  He subsequently developed stage IV disease with biopsy-proven documented long metastasis.  He is currently receiving systemic chemotherapy utilizing gemcitabine and cisplatin and here for day 8 of cycle 1 therapy.  The natural course of his disease was reviewed again and complications related to systemic chemotherapy were reiterated.  His complications include myelosuppression, neuropathy, kidney dysfunction among other complications.   After discussion today he is agreeable to proceed at this time he will receive day 8 of chemotherapy today and return in 2 weeks for evaluation prior to the start of cycle 2 of therapy.   2. IV access:Port-A-Cath placed and remains in use without any complaints.  3. Antiemetics: Compazine is available to him at this time  4. Renal function surveillance:Creatinine clearance is normal at baseline.  We will continue to monitor on cisplatin therapy.  5. Goals of care:  Aggressive therapy is warranted given an excellent performance status and limited metastatic disease.  His disease is potentially curable although these chances are low given the fact that he has documented metastatic disease.  6.  Neutropenia: His absolute neutrophil count is above 1000 and will proceed without any dose reduction or delay.  7. Follow-up: We will be in 2 weeks for the start of cycle 2 of therapy.   25  minutes was spent with the patient face-to-face today.  More than 50% of time was dedicated to reviewing his disease status, reviewing laboratory data, complications related therapy and answering questions regarding future plan of care.      Zola Button, MD 6/25/20209:51 AM

## 2019-06-08 NOTE — Progress Notes (Signed)
Ok to tx w/ ANC 1.1 per Dr. Alen Blew note from today   "Neutropenia: His absolute neutrophil count is above 1000 and will proceed without any dose reduction or delay."  Ok to treat per Dr. Alen Blew with AST 138 and ALT 182

## 2019-06-09 ENCOUNTER — Telehealth: Payer: Self-pay

## 2019-06-09 ENCOUNTER — Telehealth: Payer: Self-pay | Admitting: *Deleted

## 2019-06-09 NOTE — Telephone Encounter (Signed)
Pepto is fine to use. Antiacids can help too like tums.

## 2019-06-09 NOTE — Telephone Encounter (Signed)
Called and spoke to patient's son Shanon Brow and let him know that Pepto bismol is fine to use for patient's upset stomach as well as antacids like Tums. Shanon Brow verbalized understanding and will call the office back with any further questions or concerns.

## 2019-06-09 NOTE — Telephone Encounter (Signed)
Son called to say Robert Lowery has been having an "upset stomach" every time he eats. Not nauseated, but indigestion. Is having regular bowel movements. Used pepto bismol one time with some relief.   Wants to know what Dr Alen Blew recommends

## 2019-06-14 ENCOUNTER — Other Ambulatory Visit: Payer: Self-pay | Admitting: Physician Assistant

## 2019-06-14 DIAGNOSIS — C670 Malignant neoplasm of trigone of bladder: Secondary | ICD-10-CM

## 2019-06-17 ENCOUNTER — Encounter: Payer: Self-pay | Admitting: Oncology

## 2019-06-21 ENCOUNTER — Other Ambulatory Visit: Payer: Self-pay

## 2019-06-21 ENCOUNTER — Inpatient Hospital Stay: Payer: Medicare Other | Attending: Oncology

## 2019-06-21 ENCOUNTER — Inpatient Hospital Stay (HOSPITAL_BASED_OUTPATIENT_CLINIC_OR_DEPARTMENT_OTHER): Payer: Medicare Other | Admitting: Physician Assistant

## 2019-06-21 VITALS — BP 135/75 | HR 54 | Temp 98.5°F | Resp 18 | Ht 64.0 in | Wt 139.2 lb

## 2019-06-21 DIAGNOSIS — Z7982 Long term (current) use of aspirin: Secondary | ICD-10-CM | POA: Insufficient documentation

## 2019-06-21 DIAGNOSIS — Z5111 Encounter for antineoplastic chemotherapy: Secondary | ICD-10-CM | POA: Insufficient documentation

## 2019-06-21 DIAGNOSIS — E875 Hyperkalemia: Secondary | ICD-10-CM | POA: Insufficient documentation

## 2019-06-21 DIAGNOSIS — Z79899 Other long term (current) drug therapy: Secondary | ICD-10-CM | POA: Diagnosis not present

## 2019-06-21 DIAGNOSIS — M62838 Other muscle spasm: Secondary | ICD-10-CM | POA: Insufficient documentation

## 2019-06-21 DIAGNOSIS — C670 Malignant neoplasm of trigone of bladder: Secondary | ICD-10-CM

## 2019-06-21 DIAGNOSIS — K5900 Constipation, unspecified: Secondary | ICD-10-CM | POA: Insufficient documentation

## 2019-06-21 DIAGNOSIS — M25512 Pain in left shoulder: Secondary | ICD-10-CM | POA: Insufficient documentation

## 2019-06-21 DIAGNOSIS — Z7689 Persons encountering health services in other specified circumstances: Secondary | ICD-10-CM | POA: Diagnosis not present

## 2019-06-21 DIAGNOSIS — C7800 Secondary malignant neoplasm of unspecified lung: Secondary | ICD-10-CM | POA: Insufficient documentation

## 2019-06-21 DIAGNOSIS — C679 Malignant neoplasm of bladder, unspecified: Secondary | ICD-10-CM

## 2019-06-21 DIAGNOSIS — K30 Functional dyspepsia: Secondary | ICD-10-CM | POA: Insufficient documentation

## 2019-06-21 LAB — CBC WITH DIFFERENTIAL (CANCER CENTER ONLY)
Abs Immature Granulocytes: 0 10*3/uL (ref 0.00–0.07)
Basophils Absolute: 0 10*3/uL (ref 0.0–0.1)
Basophils Relative: 0 %
Eosinophils Absolute: 0.1 10*3/uL (ref 0.0–0.5)
Eosinophils Relative: 2 %
HCT: 37.2 % — ABNORMAL LOW (ref 39.0–52.0)
Hemoglobin: 12.2 g/dL — ABNORMAL LOW (ref 13.0–17.0)
Immature Granulocytes: 0 %
Lymphocytes Relative: 58 %
Lymphs Abs: 1.9 10*3/uL (ref 0.7–4.0)
MCH: 29.1 pg (ref 26.0–34.0)
MCHC: 32.8 g/dL (ref 30.0–36.0)
MCV: 88.8 fL (ref 80.0–100.0)
Monocytes Absolute: 0.4 10*3/uL (ref 0.1–1.0)
Monocytes Relative: 11 %
Neutro Abs: 1 10*3/uL — ABNORMAL LOW (ref 1.7–7.7)
Neutrophils Relative %: 29 %
Platelet Count: 385 10*3/uL (ref 150–400)
RBC: 4.19 MIL/uL — ABNORMAL LOW (ref 4.22–5.81)
RDW: 12.6 % (ref 11.5–15.5)
WBC Count: 3.2 10*3/uL — ABNORMAL LOW (ref 4.0–10.5)
nRBC: 0 % (ref 0.0–0.2)

## 2019-06-21 LAB — CMP (CANCER CENTER ONLY)
ALT: 18 U/L (ref 0–44)
AST: 17 U/L (ref 15–41)
Albumin: 3.9 g/dL (ref 3.5–5.0)
Alkaline Phosphatase: 61 U/L (ref 38–126)
Anion gap: 7 (ref 5–15)
BUN: 13 mg/dL (ref 8–23)
CO2: 28 mmol/L (ref 22–32)
Calcium: 8.7 mg/dL — ABNORMAL LOW (ref 8.9–10.3)
Chloride: 106 mmol/L (ref 98–111)
Creatinine: 0.92 mg/dL (ref 0.61–1.24)
GFR, Est AFR Am: >60 mL/min (ref >60)
GFR, Estimated: >60 mL/min (ref >60)
Glucose, Bld: 82 mg/dL (ref 70–99)
Potassium: 5.2 mmol/L — ABNORMAL HIGH (ref 3.5–5.1)
Sodium: 141 mmol/L (ref 135–145)
Total Bilirubin: 0.3 mg/dL (ref 0.3–1.2)
Total Protein: 6.9 g/dL (ref 6.5–8.1)

## 2019-06-21 NOTE — Progress Notes (Signed)
Hematology and Oncology Follow Up Visit  Robert Lowery [id number] 1956/05/29 63 y.o. 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   Prior Therapy:  He is status post cystectomy done in July 2019.  Surgery performed in [place]. He is status post lung biopsy completed on June 2 of 2020.   Current therapy: Gemcitabine and cisplatin chemotherapy cycle 1 started on 06/02/2019.  He is here for evaluation before day 1 of cycle 2 which is scheduled for tomorrow.  Interim History: Mr. Spanos presents today for a follow-up visit.  Since the last visit, he received cycle #1 of chemotherapy with gemcitabine and cisplatin without any concerning complications. He reports mild constipation which was successfully managed with prune juice, indigestion which was managed with tums, and mild skin itching without any rashes or skin changes. He denies any nausea, vomiting or peripheral neuropathy.    He does not report any headaches, blurry vision, syncope or seizures. Does not report any fevers, chills or sweats. Does not report any cough, wheezing or hemoptysis. Does not report any chest pain, palpitation, orthopnea or leg edema. Does not report any nausea, vomiting or abdominal pain. Does not report any  diarrhea. Does not report any skeletal complaints. Does not report frequency, urgency or hematuria.  Does not report any skin rashes or lesions. Does not report any heat or cold intolerance.  Does not report any lymphadenopathy or petechiae.  Does not report any anxiety or depression.  Remaining review of systems is negative.   MEDICAL HISTORY: Past Medical History:  Diagnosis Date  . Bladder cancer ([place]) 05/10/2018   TUR of the BLADDER...DR. Jeffie Pollock  . Chest pain    at rest  . Hearing loss    right ear  . Hepatitis    history of Hep. B  . History of  hiatal hernia 05/30/2016   Small retrocardiac, noted on CT  . Internal hemorrhoids   . Liver lesion   . Mild cardiomegaly   . Scrotal nodule    Bilateral    ALLERGIES:  has No Known Allergies.  MEDICATIONS:  Current Outpatient Medications  Medication Sig Dispense Refill  . aspirin 81 MG chewable tablet Chew 1 tablet (81 mg total) by mouth daily.    Marland Kitchen lidocaine-prilocaine (EMLA) cream Apply 1 application topically as needed. 30 g 0  . prochlorperazine (COMPAZINE) 10 MG tablet TAKE ONE TABLET BY MOUTH EVERY 6 HOURS AS NEEDED FOR NAUSEA OR VOMITING 30 tablet 0   No current facility-administered medications for this visit.     SURGICAL HISTORY:  Past Surgical History:  Procedure Laterality Date  . BIOPSY TESTIS     bilateral testicle mass  . COLONOSCOPY    . CYSTOSCOPY/RETROGRADE/URETEROSCOPY N/A 05/10/2018   Procedure: BILATERAL RETROGRADE;  Surgeon: [name], MD;  Location: [facility];  Service: Urology;  Laterality: N/A;  . IR IMAGING GUIDED PORT INSERTION  05/31/2019  . TRANSURETHRAL RESECTION OF BLADDER TUMOR WITH MITOMYCIN-C N/A 05/10/2018   Procedure: CYSTOSCOPY TRANSURETHRAL RESECTION OF BLADDER TUMOR WITH MITOMYCIN-C;  Surgeon: [name], MD;  Location: [facility];  Service: Urology;  Laterality: N/A;  . UPPER GI ENDOSCOPY      REVIEW OF SYSTEMS:   Review of Systems  Constitutional: Negative for appetite change, chills, fatigue, fever and unexpected weight change.  HENT: Negative for mouth sores, nosebleeds, sore throat and trouble swallowing.   Eyes:  Negative for eye problems and icterus.  Respiratory: Negative for cough, hemoptysis, shortness of breath and wheezing.   Cardiovascular: Negative for chest pain and leg swelling.  Gastrointestinal: Positive for mild constipation and indigestion. Negative for abdominal pain, diarrhea, nausea and vomiting.  Genitourinary: Negative for bladder incontinence, difficulty urinating, dysuria, frequency and hematuria.    Musculoskeletal: Negative for back pain, gait problem, neck pain and neck stiffness.  Skin: Positive for itching. Negative for rash.  Neurological: Negative for dizziness, extremity weakness, gait problem, headaches, light-headedness and seizures.  Hematological: Negative for adenopathy. Does not bruise/bleed easily.  Psychiatric/Behavioral: Negative for confusion, depression and sleep disturbance. The patient is not nervous/anxious.     PHYSICAL EXAMINATION:  Blood pressure 135/75, pulse (!) 54, temperature 98.5 F (36.9 C), temperature source Oral, resp. rate 18, height 5\' 4"  (1.626 m), weight 139 lb 3.2 oz (63.1 kg), SpO2 97 %.  ECOG PERFORMANCE STATUS: 1 - Symptomatic but completely ambulatory  General appearance:Comfortable appearing without any discomfort Head:Normocephalic without any trauma Oropharynx: Mucous membranes are moist and pink without any thrush or ulcers. Eyes: Pupils are equal and round reactive to light. Lymph nodes:No cervical, supraclavicular,inguinal or axillary lymphadenopathy.   Heart:regular rate and rhythm.  S1 and S2 without leg edema. Lung:Clear without any rhonchi or wheezes.  No dullness to percussion. Abdomin:Soft, nontender, nondistended with good bowel sounds.  No hepatosplenomegaly. Musculoskeletal: No joint deformity or effusion.  Full range of motion noted. Neurological: No deficits noted on motor, sensory and deep tendon reflex exam. Skin: No petechial rash or dryness.  Appeared moist.  Psychiatric: Mood and affect appeared appropriate.  LABORATORY DATA: Lab Results  Component Value Date   WBC 3.2 (L) 06/21/2019   HGB 12.2 (L) 06/21/2019   HCT 37.2 (L) 06/21/2019   MCV 88.8 06/21/2019   PLT 385 06/21/2019      Chemistry      Component Value Date/Time   NA 141 06/21/2019 0916   K 5.2 (H) 06/21/2019 0916   CL 106 06/21/2019 0916   CO2 28 06/21/2019 0916   BUN 13 06/21/2019 0916   CREATININE 0.92 06/21/2019 0916      Component  Value Date/Time   CALCIUM 8.7 (L) 06/21/2019 0916   ALKPHOS 61 06/21/2019 0916   AST 17 06/21/2019 0916   ALT 18 06/21/2019 0916   BILITOT 0.3 06/21/2019 0916       RADIOGRAPHIC STUDIES:  Ir Imaging Guided Port Insertion  Result Date: 05/31/2019 CLINICAL DATA:  Metastatic urothelial carcinoma of the bladder and need for porta cath for chemotherapy. EXAM: IMPLANTED PORT A CATH PLACEMENT WITH ULTRASOUND AND FLUOROSCOPIC GUIDANCE ANESTHESIA/SEDATION: 1.0 mg IV Versed; 50 mcg IV Fentanyl Total Moderate Sedation Time:  38 minutes The patient's level of consciousness and physiologic status were continuously monitored during the procedure by Radiology nursing. Additional Medications: 2 g IV Ancef. FLUOROSCOPY TIME:  18 seconds.  1.0 mGy. PROCEDURE: The procedure, risks, benefits, and alternatives were explained to the patient. Questions regarding the procedure were encouraged and answered. The patient understands and consents to the procedure. A time-out was performed prior to initiating the procedure. Ultrasound was utilized to confirm patency of the right internal jugular vein. The right neck and chest were prepped with chlorhexidine in a sterile fashion, and a sterile drape was applied covering the operative field. Maximum barrier sterile technique with sterile gowns and gloves were used for the procedure. Local anesthesia was provided with 1% lidocaine. After creating a small venotomy incision, a 21 gauge needle was  advanced into the right internal jugular vein under direct, real-time ultrasound guidance. Ultrasound image documentation was performed. After securing guidewire access, an 8 Fr dilator was placed. A J-wire was kinked to measure appropriate catheter length. A subcutaneous port pocket was then created along the upper chest wall utilizing sharp and blunt dissection. Portable cautery was utilized. The pocket was irrigated with sterile saline. A single lumen power injectable port was chosen for  placement. The 8 Fr catheter was tunneled from the port pocket site to the venotomy incision. The port was placed in the pocket. External catheter was trimmed to appropriate length based on guidewire measurement. At the venotomy, an 8 Fr peel-away sheath was placed over a guidewire. The catheter was then placed through the sheath and the sheath removed. Final catheter positioning was confirmed and documented with a fluoroscopic spot image. The port was accessed with a needle and aspirated and flushed with heparinized saline. The access needle was removed. The venotomy and port pocket incisions were closed with subcutaneous 3-0 Monocryl and subcuticular 4-0 Vicryl. Dermabond was applied to both incisions. COMPLICATIONS: COMPLICATIONS None FINDINGS: After catheter placement, the tip lies at the cavo-atrial junction. The catheter aspirates normally and is ready for immediate use. IMPRESSION: Placement of single lumen port a cath via right internal jugular vein. The catheter tip lies at the cavo-atrial junction. A power injectable port a cath was placed and is ready for immediate use. Electronically Signed   By: Aletta Edouard M.D.   On: 05/31/2019 10:25     ASSESSMENT/PLAN:   1.   Urothelial carcinoma arising from the bladder diagnosed in 2019.  He subsequently developed stage IV disease with biopsy-proven documented long metastasis.  He is currently receiving systemic chemotherapy utilizing gemcitabine and cisplatin. He is here for evaluation prior to starting day 1 of cycle 2 tomorrow as scheduled. Complications related to systemic chemotherapy were reiterated.  His complications include myelosuppression, neuropathy, kidney dysfunction among other complications.   After discussion today he is agreeable to proceed at this time. He will receive day 1 of cycle 2 of chemotherapy tomorrow and return in 2 weeks for evaluation prior to the start of cycle 3 of therapy.   2. IV access:Port-A-Cath placed and  remains in use without any complaints.  3. Antiemetics: Compazine is available to him at this time  4. Renal function surveillance:Creatinine clearance is normal at baseline.  We will continue to monitor on cisplatin therapy.  5. Goals of care: Aggressive therapy is warranted given an excellent performance status and limited metastatic disease.  His disease is potentially curable although these chances are low given the fact that he has documented metastatic disease.  6.  Neutropenia: His absolute neutrophil count is 1000 today. Per Dr. Hazeline Junker last note, he will proceed with treatment without any dose reduction or delay if his [place] is above 1000. He will receive cycle #2 tomorrow as scheduled. I reviewed neutropenic precautions with the patient today. He was instructed to call the office if he develops signs and symptoms of infection including fevers, chills, night sweats, sore throat, cough, shortness of breath, etc. We will recheck his WBCs/ANC next week for day 8 of cycle 2 and will address this if it drops below 1000 if necessary.    7. Hyperkalemia: The patient's potassium was mildly elevated above normal limits today at 5.2. The patient admits to eating significant amounts of potassium rich foods such as sweet potatoes and avocados. I provided the patient a handout of potassium rich  and potassium low foods so he may adjust his diet to eat these foods in moderation.   8. Follow-up: We will be in 2 weeks for the start of cycle 3 of therapy.  No orders of the defined types were placed in this encounter.    Tyann Niehaus L Jeryn Cerney, PA-C 06/21/19

## 2019-06-22 ENCOUNTER — Inpatient Hospital Stay (HOSPITAL_BASED_OUTPATIENT_CLINIC_OR_DEPARTMENT_OTHER): Payer: Medicare Other | Admitting: Medical

## 2019-06-22 ENCOUNTER — Encounter: Payer: Self-pay | Admitting: Oncology

## 2019-06-22 ENCOUNTER — Other Ambulatory Visit: Payer: Self-pay | Admitting: Medical

## 2019-06-22 ENCOUNTER — Other Ambulatory Visit: Payer: Self-pay | Admitting: Oncology

## 2019-06-22 ENCOUNTER — Other Ambulatory Visit: Payer: Self-pay

## 2019-06-22 ENCOUNTER — Inpatient Hospital Stay: Payer: Medicare Other

## 2019-06-22 VITALS — BP 137/76 | HR 68 | Temp 98.0°F | Resp 16

## 2019-06-22 DIAGNOSIS — M25512 Pain in left shoulder: Secondary | ICD-10-CM

## 2019-06-22 DIAGNOSIS — C7800 Secondary malignant neoplasm of unspecified lung: Secondary | ICD-10-CM

## 2019-06-22 DIAGNOSIS — C670 Malignant neoplasm of trigone of bladder: Secondary | ICD-10-CM

## 2019-06-22 DIAGNOSIS — Z5111 Encounter for antineoplastic chemotherapy: Secondary | ICD-10-CM | POA: Diagnosis not present

## 2019-06-22 DIAGNOSIS — C679 Malignant neoplasm of bladder, unspecified: Secondary | ICD-10-CM

## 2019-06-22 DIAGNOSIS — M62838 Other muscle spasm: Secondary | ICD-10-CM

## 2019-06-22 MED ORDER — SODIUM CHLORIDE 0.9 % IV SOLN
70.0000 mg/m2 | Freq: Once | INTRAVENOUS | Status: AC
  Administered 2019-06-22: 13:00:00 119 mg via INTRAVENOUS
  Filled 2019-06-22: qty 119

## 2019-06-22 MED ORDER — SODIUM CHLORIDE 0.9 % IV SOLN: 1000.0000 mg/m2 | Freq: Once | INTRAVENOUS | Status: DC

## 2019-06-22 MED ORDER — SODIUM CHLORIDE 0.9 % IV SOLN: 70.0000 mg/m2 | Freq: Once | INTRAVENOUS | Status: DC

## 2019-06-22 MED ORDER — PALONOSETRON HCL INJECTION 0.25 MG/5ML
0.2500 mg | Freq: Once | INTRAVENOUS | Status: AC
  Administered 2019-06-22: 11:00:00 0.25 mg via INTRAVENOUS

## 2019-06-22 MED ORDER — SODIUM CHLORIDE 0.9% FLUSH
10.0000 mL | INTRAVENOUS | Status: DC | PRN
  Administered 2019-06-22: 16:00:00 10 mL
  Filled 2019-06-22: qty 10

## 2019-06-22 MED ORDER — METHOCARBAMOL 500 MG PO TABS: 500.0000 mg | ORAL_TABLET | Freq: Four times a day (QID) | ORAL | 0 refills | Status: DC

## 2019-06-22 MED ORDER — HEPARIN SOD (PORK) LOCK FLUSH 100 UNIT/ML IV SOLN
500.0000 [IU] | Freq: Once | INTRAVENOUS | Status: AC | PRN
  Administered 2019-06-22: 500 [IU]
  Filled 2019-06-22: qty 5

## 2019-06-22 MED ORDER — POTASSIUM CHLORIDE 2 MEQ/ML IV SOLN
Freq: Once | INTRAVENOUS | Status: AC
  Administered 2019-06-22: 09:00:00 via INTRAVENOUS
  Filled 2019-06-22: qty 10

## 2019-06-22 MED ORDER — PALONOSETRON HCL INJECTION 0.25 MG/5ML
INTRAVENOUS | Status: AC
  Filled 2019-06-22: qty 5

## 2019-06-22 MED ORDER — SODIUM CHLORIDE 0.9 % IV SOLN
Freq: Once | INTRAVENOUS | Status: AC
  Administered 2019-06-22: 11:00:00 via INTRAVENOUS
  Filled 2019-06-22: qty 5

## 2019-06-22 MED ORDER — SODIUM CHLORIDE 0.9 % IV SOLN
Freq: Once | INTRAVENOUS | Status: AC
  Administered 2019-06-22: 09:00:00 via INTRAVENOUS
  Filled 2019-06-22: qty 250

## 2019-06-22 MED ORDER — SODIUM CHLORIDE 0.9 % IV SOLN
1710.0000 mg | Freq: Once | INTRAVENOUS | Status: AC
  Administered 2019-06-22: 13:00:00 1710 mg via INTRAVENOUS
  Filled 2019-06-22: qty 44.97

## 2019-06-22 NOTE — Progress Notes (Signed)
I was called earlier today regarding [name] who had a borderline/low neutrophil count.  I suggested we hold his treatment.  However the patient was seen yesterday by Dr. Hazeline Junker nurse practitioner and she was already aware of the low counts and per prior instructions the patient will proceed to treatment today.

## 2019-06-22 NOTE — Patient Instructions (Signed)
[facility] Discharge Instructions for Patients Receiving Chemotherapy  Today you received the following chemotherapy agents Gemzar and Cisplatin  To help prevent nausea and vomiting after your treatment, we encourage you to take your nausea medication as directed   If you develop nausea and vomiting that is not controlled by your nausea medication, call the clinic.   BELOW ARE SYMPTOMS THAT SHOULD BE REPORTED IMMEDIATELY:  *FEVER GREATER THAN 100.5 F  *CHILLS WITH OR WITHOUT FEVER  NAUSEA AND VOMITING THAT IS NOT CONTROLLED WITH YOUR NAUSEA MEDICATION  *UNUSUAL SHORTNESS OF BREATH  *UNUSUAL BRUISING OR BLEEDING  TENDERNESS IN MOUTH AND THROAT WITH OR WITHOUT PRESENCE OF ULCERS  *URINARY PROBLEMS  *BOWEL PROBLEMS  UNUSUAL RASH Items with * indicate a potential emergency and should be followed up as soon as possible.  Feel free to call the clinic should you have any questions or concerns. The clinic phone number is [phone].  Please show the CHEMO ALERT CARD at check-in to the Emergency Department and triage nurse.   

## 2019-06-22 NOTE — Progress Notes (Signed)
Pt c/o bilateral neck tension. PA-C Sandi Mealy over to assess patient.

## 2019-06-22 NOTE — Progress Notes (Signed)
I agree. Thanks Noted.

## 2019-06-22 NOTE — Progress Notes (Deleted)
Spoke with MD on Call Diaz. MD would like to hold chemo treatment today for patient with [place] of 1.   Larene Beach, PharmD

## 2019-06-22 NOTE — Progress Notes (Signed)
I was called regarding Robert Lowery.  His ANC today is only 1.0.  I do not think we can go ahead and treat today with those counts.  He is already scheduled for follow-up and I will make sure Dr. Alen Blew is aware.

## 2019-06-23 ENCOUNTER — Telehealth: Payer: Self-pay | Admitting: Medical

## 2019-06-23 NOTE — Telephone Encounter (Signed)
No los per 7/9.

## 2019-06-25 NOTE — Progress Notes (Signed)
The patient was seen in the infusion room when he reported having pain in his left shoulder. A brief exam showed muscle spasms in the bilateral shoulders. The patient was given a prescription for Robaxin 500 mg PO QID, #40 with no refills.  Sandi Mealy, MHS, PA-C Physician Assistant

## 2019-06-29 ENCOUNTER — Inpatient Hospital Stay: Payer: Medicare Other

## 2019-06-29 ENCOUNTER — Other Ambulatory Visit: Payer: Self-pay

## 2019-06-29 ENCOUNTER — Telehealth: Payer: Self-pay

## 2019-06-29 VITALS — BP 136/71 | HR 56 | Temp 98.4°F | Resp 18 | Wt 139.5 lb

## 2019-06-29 DIAGNOSIS — C679 Malignant neoplasm of bladder, unspecified: Secondary | ICD-10-CM

## 2019-06-29 DIAGNOSIS — Z95828 Presence of other vascular implants and grafts: Secondary | ICD-10-CM

## 2019-06-29 DIAGNOSIS — C670 Malignant neoplasm of trigone of bladder: Secondary | ICD-10-CM

## 2019-06-29 DIAGNOSIS — Z5111 Encounter for antineoplastic chemotherapy: Secondary | ICD-10-CM | POA: Diagnosis not present

## 2019-06-29 LAB — CBC WITH DIFFERENTIAL (CANCER CENTER ONLY)
Abs Immature Granulocytes: 0.01 10*3/uL (ref 0.00–0.07)
Basophils Absolute: 0 10*3/uL (ref 0.0–0.1)
Basophils Relative: 1 %
Eosinophils Absolute: 0 10*3/uL (ref 0.0–0.5)
Eosinophils Relative: 0 %
HCT: 32.4 % — ABNORMAL LOW (ref 39.0–52.0)
Hemoglobin: 10.6 g/dL — ABNORMAL LOW (ref 13.0–17.0)
Immature Granulocytes: 0 %
Lymphocytes Relative: 70 %
Lymphs Abs: 1.8 10*3/uL (ref 0.7–4.0)
MCH: 28.9 pg (ref 26.0–34.0)
MCHC: 32.7 g/dL (ref 30.0–36.0)
MCV: 88.3 fL (ref 80.0–100.0)
Monocytes Absolute: 0.1 10*3/uL (ref 0.1–1.0)
Monocytes Relative: 5 %
Neutro Abs: 0.6 10*3/uL — ABNORMAL LOW (ref 1.7–7.7)
Neutrophils Relative %: 24 %
Platelet Count: 571 10*3/uL — ABNORMAL HIGH (ref 150–400)
RBC: 3.67 MIL/uL — ABNORMAL LOW (ref 4.22–5.81)
RDW: 12.7 % (ref 11.5–15.5)
WBC Count: 2.6 10*3/uL — ABNORMAL LOW (ref 4.0–10.5)
nRBC: 0 % (ref 0.0–0.2)

## 2019-06-29 LAB — CMP (CANCER CENTER ONLY)
ALT: 14 U/L (ref 0–44)
AST: 17 U/L (ref 15–41)
Albumin: 3.7 g/dL (ref 3.5–5.0)
Alkaline Phosphatase: 51 U/L (ref 38–126)
Anion gap: 8 (ref 5–15)
BUN: 15 mg/dL (ref 8–23)
CO2: 27 mmol/L (ref 22–32)
Calcium: 8.5 mg/dL — ABNORMAL LOW (ref 8.9–10.3)
Chloride: 103 mmol/L (ref 98–111)
Creatinine: 0.85 mg/dL (ref 0.61–1.24)
GFR, Est AFR Am: >60 mL/min (ref >60)
GFR, Estimated: >60 mL/min (ref >60)
Glucose, Bld: 88 mg/dL (ref 70–99)
Potassium: 4.5 mmol/L (ref 3.5–5.1)
Sodium: 138 mmol/L (ref 135–145)
Total Bilirubin: 0.2 mg/dL — ABNORMAL LOW (ref 0.3–1.2)
Total Protein: 6.7 g/dL (ref 6.5–8.1)

## 2019-06-29 MED ORDER — HEPARIN SOD (PORK) LOCK FLUSH 100 UNIT/ML IV SOLN
500.0000 [IU] | Freq: Once | INTRAVENOUS | Status: AC
  Administered 2019-06-29: 500 [IU]
  Filled 2019-06-29: qty 5

## 2019-06-29 MED ORDER — SODIUM CHLORIDE 0.9% FLUSH
10.0000 mL | Freq: Once | INTRAVENOUS | Status: AC
  Administered 2019-06-29: 10 mL
  Filled 2019-06-29: qty 10

## 2019-06-29 NOTE — Telephone Encounter (Signed)
Received call from patient son Shanon Brow stating that the patient did not receive chemo today and did not understand the next plan for treatment. Explained that the patient has his next appt on 7/29 and Dr. Alen Blew will answer any questions at that time. Explained that the patient can have the son, Shanon Brow, on speaker phone for the appt so he can also hear the information if the patient chooses. He had no other questions or concerns.

## 2019-06-29 NOTE — Patient Instructions (Signed)
Neutropenia Neutropenia is a condition that occurs when you have a lower-than-normal level of a type of white blood cell (neutrophil) in your body. Neutrophils are made in the spongy center of large bones (bone marrow), and they fight infections. Neutrophils are your body's main defense against bacterial and fungal infections. The fewer neutrophils you have and the longer your body remains without them, the greater your risk of getting a severe infection. What are the causes? This condition can occur if your body uses up or destroys neutrophils faster than your bone marrow can make them. Neutropenia may be caused by:  A bacterial or fungal infection.  Allergic disorders.  Reactions to some medicines.  An autoimmune disease.  An enlarged spleen. This condition can also occur if your bone marrow does not produce enough neutrophils. This problem may be caused by:  Cancer.  Cancer treatments, such as radiation or chemotherapy.  Viral infections.  Medicines, such as phenytoin.  Vitamin B12 deficiency.  Diseases of the bone marrow.  Environmental toxins, such as insecticides. What are the signs or symptoms? This condition does not usually cause symptoms. If symptoms are present, they are usually caused by an underlying infection. Symptoms of an infection may include:  Fever.  Chills.  Swollen glands.  Oral or anal ulcers.  Cough and shortness of breath.  Rash.  Skin infection.  Fatigue. How is this diagnosed? Your health care provider may suspect neutropenia if you have:  A condition that may cause neutropenia.  Symptoms during or after treatment for cancer.  Symptoms of infection, especially fever.  Frequent and unusual infections. This condition is diagnosed based on your medical history and a physical exam. Tests will also be done, such as:  A complete blood count (CBC).  A procedure to collect a sample of bone marrow for examination (bone marrow biopsy).   A chest X-ray.  A urine culture.  A blood culture. How is this treated? Treatment depends on the underlying cause and severity of your condition. Mild neutropenia may not require treatment. Treatment may include medicines, such as:  Antibiotic medicine given through an IV.  Antiviral medicines.  Antifungal medicines.  A medicine to increase neutrophil production (colony-stimulating factor). You may get this drug through an IV or by injection.  Steroids given through an IV. If an underlying condition is causing neutropenia, you may need treatment for that condition. If medicines or cancer treatments are causing neutropenia, your health care provider may have you stop the medicines or treatment. Follow these instructions at home: Medicines   Take over-the-counter and prescription medicines only as told by your health care provider.  Get a seasonal flu shot (influenza vaccine).  Avoid people who received a vaccine in the past 30 days if that vaccine contained a live version of the germ (live vaccine). You should not get a live vaccine. Common live vaccines are polio, MMR, chicken pox, and shingles vaccines. Eating and drinking  Do not share food utensils.  Do not eat unpasteurized foods.  Do not eat raw or undercooked meat, eggs, or seafood.  Do not eat unwashed, raw fruits or vegetables. Lifestyle  Avoid exposure to groups of people or children.  Avoid being around people who are sick.  Avoid being around dirt or dust, such as in construction areas or gardens.  Do not provide direct care for pets. Avoid animal droppings. Do not clean litter boxes and bird cages.  Do not have sex unless your health care provider has approved. Hygiene     Bathe daily.  Clean the area between the genitals and the anus (perineal area) after you urinate or have a bowel movement. If you are male, wipe from front to back.  Brush your teeth with a soft toothbrush before and after meals.   Do not use a regular razor. Use an electric razor to remove hair.  Wash your hands often. Make sure others who come in contact with you also wash their hands. If soap and water are not available, use hand sanitizer. General instructions  Follow any precautions as told by your health care provider to reduce your risk for injury or infection.  Take actions to avoid cuts and burns. For example: ? Be cautious when you use knives. Always cut away from yourself. ? Keep knives in protective sheaths or guards when not in use. ? Use oven mitts when you cook with a hot stove, oven, or grill. ? Stand a safe distance away from open fires.  Do not use tampons, enemas, or rectal suppositories unless your health care provider has approved.  Keep all follow-up visits as told by your health care provider. This is important. Contact a health care provider if:  You have: ? A sore throat. ? A warm, red, or tender area on your skin. ? A cough. ? Frequent or painful urination. ? Vaginal discharge or itching.  You develop: ? Sores in your mouth or anus. ? Swollen lymph nodes. ? Red streaks on the skin. ? A rash. Get help right away if:  You have: ? A fever. ? Chills, or you start to shake.  You feel: ? Nauseous, or you vomit. ? Very fatigued. ? Short of breath. Summary  Neutropenia is a condition that occurs when you have a lower-than-normal level of a type of white blood cell (neutrophil) in your body.  This condition can occur if your body uses up or destroys neutrophils faster than your bone marrow can make them.  Treatment depends on the underlying cause and severity of your condition. Mild neutropenia may not require treatment.  Follow any precautions as told by your health care provider to reduce your risk for injury or infection. This information is not intended to replace advice given to you by your health care provider. Make sure you discuss any questions you have with your health  care provider. Document Released: 05/22/2002 Document Revised: 09/15/2018 Document Reviewed: 09/15/2018 Elsevier Patient Education  2020 [employer]  

## 2019-06-29 NOTE — Patient Instructions (Signed)

## 2019-06-29 NOTE — Progress Notes (Signed)
Patient not receiving treatment today due to [place] 0.6.

## 2019-07-12 ENCOUNTER — Inpatient Hospital Stay (HOSPITAL_BASED_OUTPATIENT_CLINIC_OR_DEPARTMENT_OTHER): Payer: Medicare Other | Admitting: Oncology

## 2019-07-12 ENCOUNTER — Inpatient Hospital Stay: Payer: Medicare Other

## 2019-07-12 ENCOUNTER — Other Ambulatory Visit: Payer: Self-pay

## 2019-07-12 ENCOUNTER — Telehealth: Payer: Self-pay | Admitting: Oncology

## 2019-07-12 VITALS — BP 145/67 | HR 58 | Temp 98.7°F | Resp 18 | Wt 140.2 lb

## 2019-07-12 DIAGNOSIS — E785 Hyperlipidemia, unspecified: Secondary | ICD-10-CM

## 2019-07-12 DIAGNOSIS — E875 Hyperkalemia: Secondary | ICD-10-CM

## 2019-07-12 DIAGNOSIS — C670 Malignant neoplasm of trigone of bladder: Secondary | ICD-10-CM | POA: Diagnosis not present

## 2019-07-12 DIAGNOSIS — Z79899 Other long term (current) drug therapy: Secondary | ICD-10-CM

## 2019-07-12 DIAGNOSIS — C7800 Secondary malignant neoplasm of unspecified lung: Secondary | ICD-10-CM

## 2019-07-12 DIAGNOSIS — Z5111 Encounter for antineoplastic chemotherapy: Secondary | ICD-10-CM | POA: Diagnosis not present

## 2019-07-12 DIAGNOSIS — C679 Malignant neoplasm of bladder, unspecified: Secondary | ICD-10-CM

## 2019-07-12 DIAGNOSIS — Z7982 Long term (current) use of aspirin: Secondary | ICD-10-CM

## 2019-07-12 LAB — CMP (CANCER CENTER ONLY)
ALT: 10 U/L (ref 0–44)
AST: 15 U/L (ref 15–41)
Albumin: 3.9 g/dL (ref 3.5–5.0)
Alkaline Phosphatase: 44 U/L (ref 38–126)
Anion gap: 5 (ref 5–15)
BUN: 20 mg/dL (ref 8–23)
CO2: 29 mmol/L (ref 22–32)
Calcium: 9.1 mg/dL (ref 8.9–10.3)
Chloride: 106 mmol/L (ref 98–111)
Creatinine: 0.98 mg/dL (ref 0.61–1.24)
GFR, Est AFR Am: >60 mL/min (ref >60)
GFR, Estimated: >60 mL/min (ref >60)
Glucose, Bld: 80 mg/dL (ref 70–99)
Potassium: 5.1 mmol/L (ref 3.5–5.1)
Sodium: 140 mmol/L (ref 135–145)
Total Bilirubin: 0.4 mg/dL (ref 0.3–1.2)
Total Protein: 6.9 g/dL (ref 6.5–8.1)

## 2019-07-12 LAB — CBC WITH DIFFERENTIAL (CANCER CENTER ONLY)
Abs Immature Granulocytes: 0.01 10*3/uL (ref 0.00–0.07)
Basophils Absolute: 0 10*3/uL (ref 0.0–0.1)
Basophils Relative: 0 %
Eosinophils Absolute: 0.1 10*3/uL (ref 0.0–0.5)
Eosinophils Relative: 1 %
HCT: 34.8 % — ABNORMAL LOW (ref 39.0–52.0)
Hemoglobin: 11.2 g/dL — ABNORMAL LOW (ref 13.0–17.0)
Immature Granulocytes: 0 %
Lymphocytes Relative: 46 %
Lymphs Abs: 2.3 10*3/uL (ref 0.7–4.0)
MCH: 29.5 pg (ref 26.0–34.0)
MCHC: 32.2 g/dL (ref 30.0–36.0)
MCV: 91.6 fL (ref 80.0–100.0)
Monocytes Absolute: 0.5 10*3/uL (ref 0.1–1.0)
Monocytes Relative: 10 %
Neutro Abs: 2.1 10*3/uL (ref 1.7–7.7)
Neutrophils Relative %: 43 %
Platelet Count: 265 10*3/uL (ref 150–400)
RBC: 3.8 MIL/uL — ABNORMAL LOW (ref 4.22–5.81)
RDW: 14 % (ref 11.5–15.5)
WBC Count: 5 10*3/uL (ref 4.0–10.5)
nRBC: 0 % (ref 0.0–0.2)

## 2019-07-12 NOTE — Progress Notes (Signed)
Hematology and Oncology Follow Up Visit  Robert Lowery [id number] [birth year] 63 y.o. 07/12/2019 11:32 AM Robert Lowery, MDMoreira, Robert Manner, MD   Principle Diagnosis: 63 year old with bladder cancer diagnosed in 2019.  He developed stage IV disease with lung metastasis in June 2020.   Prior Therapy:   He is status post cystectomy done in July 2019.  Surgery performed in [place]. He is status post lung biopsy completed on June 2 of 2020.   Current therapy: Gemcitabine and cisplatin chemotherapy cycle 1 started on 06/02/2019.  He is here for evaluation prior to the start of cycle 3.    Interim History: [name] returns today for repeat evaluation.  Since last visit, he completed 2 cycles of chemotherapy with day 8 of cycle 2 withheld because of neutropenia.  He denies any complications related to chemotherapy.  He denies any nausea vomiting or neuropathy.  He denies any worsening fatigue or hospitalization.  He denies any abdominal pain or discomfort.   He denied any alteration mental status, neuropathy, confusion or dizziness.  Denies any headaches or lethargy.  Denies any night sweats, weight loss or changes in appetite.  Denied orthopnea, dyspnea on exertion or chest discomfort.  Denies shortness of breath, difficulty breathing hemoptysis or cough.  Denies any abdominal distention, nausea, early satiety or dyspepsia.  Denies any hematuria, frequency, dysuria or nocturia.  Denies any skin irritation, dryness or rash.  Denies any ecchymosis or petechiae.  Denies any lymphadenopathy or clotting.  Denies any heat or cold intolerance.  Denies any anxiety or depression.  Remaining review of system is negative.     Medications: Unchanged by my review. Current Outpatient Medications  Medication Sig Dispense Refill  . aspirin 81 MG chewable tablet Chew 1 tablet (81 mg total) by mouth daily.    Marland Kitchen lidocaine-prilocaine (EMLA) cream Apply 1 application topically as needed. 30 g 0  . methocarbamol  (ROBAXIN) 500 MG tablet Take 1 tablet (500 mg total) by mouth 4 (four) times daily. 40 tablet 0  . prochlorperazine (COMPAZINE) 10 MG tablet TAKE ONE TABLET BY MOUTH EVERY 6 HOURS AS NEEDED FOR NAUSEA OR VOMITING 30 tablet 0   No current facility-administered medications for this visit.      Allergies: No Known Allergies  Past Medical History, Surgical history, Social history, and Family History remains without change on update.   Physical Exam:   Blood pressure (!) 145/67, pulse (!) 58, temperature 98.7 F (37.1 C), temperature source Temporal, resp. rate 18, weight 140 lb 3.2 oz (63.6 kg), SpO2 96 %.    ECOG: 0   General appearance: Alert, awake without any distress. Head: Atraumatic without abnormalities Oropharynx: Without any thrush or ulcers. Eyes: No scleral icterus. Lymph nodes: No lymphadenopathy noted in the cervical, supraclavicular, or axillary nodes Heart:regular rate and rhythm, without any murmurs or gallops.   Lung: Clear to auscultation without any rhonchi, wheezes or dullness to percussion. Abdomin: Soft, nontender without any shifting dullness or ascites. Musculoskeletal: No clubbing or cyanosis. Neurological: No motor or sensory deficits. Skin: No rashes or lesions.    Lab Results: Lab Results  Component Value Date   WBC 2.6 (L) 06/29/2019   HGB 10.6 (L) 06/29/2019   HCT 32.4 (L) 06/29/2019   MCV 88.3 06/29/2019   PLT 571 (H) 06/29/2019     Chemistry      Component Value Date/Time   NA 138 06/29/2019 1235   K 4.5 06/29/2019 1235   CL 103 06/29/2019 1235   CO2  27 06/29/2019 1235   BUN 15 06/29/2019 1235   CREATININE 0.85 06/29/2019 1235      Component Value Date/Time   CALCIUM 8.5 (L) 06/29/2019 1235   ALKPHOS 51 06/29/2019 1235   AST 17 06/29/2019 1235   ALT 14 06/29/2019 1235   BILITOT 0.2 (L) 06/29/2019 1235        Impression and Plan:   63 year old man with  1.  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    He is currently receiving chemotherapy with gemcitabine and cisplatin and completed 2 cycles of therapy.  Day 8 of cycle 2 was withheld because of neutropenia.  Risks and benefits of continuing this therapy was discussed today.  Potential complications were reiterated which include nausea, myelosuppression, neutropenia, renal dysfunction as well as infusion related complications.  After discussion he is agreeable to proceed with cycle 3 and will obtain imaging studies to assess his response.   2. IV access:Port-A-Cath remains in place without any issues or complications.  3. Antiemetics: No nausea or vomiting reported at this time.  Compazine is adequate in controlling his symptoms.  4. Renal function surveillance:No issues reported with his kidney function on cisplatin.  5. Goals of care:  Therapy is potentially curative although he has stage IV disease.  Aggressive measures are warranted given his excellent performance status and limited disease.  6.  Neutropenia: His counts has recovered at this time but he is susceptible to developing neutropenia and possible infection.  We will add growth factor support on day 9 of each cycle.  7. Follow-up: On 07/13/2019 for the start of cycle 3 of therapy.  11 MD follow-up in 3 weeks before the start of cycle 4.   25  minutes was spent with the patient face-to-face today.  More than 50% of time was spent on reviewing his disease status, updating treatment options, dealing with complications related to therapy and future plan of care.      Zola Button, MD 7/29/202011:32 AM

## 2019-07-12 NOTE — Telephone Encounter (Signed)
Gave avs and calendar ° °

## 2019-07-13 ENCOUNTER — Other Ambulatory Visit: Payer: Self-pay

## 2019-07-13 ENCOUNTER — Inpatient Hospital Stay: Payer: Medicare Other

## 2019-07-13 VITALS — BP 126/70 | HR 48 | Temp 98.3°F | Resp 16

## 2019-07-13 DIAGNOSIS — C679 Malignant neoplasm of bladder, unspecified: Secondary | ICD-10-CM

## 2019-07-13 DIAGNOSIS — Z5111 Encounter for antineoplastic chemotherapy: Secondary | ICD-10-CM | POA: Diagnosis not present

## 2019-07-13 MED ORDER — SODIUM CHLORIDE 0.9 % IV SOLN
Freq: Once | INTRAVENOUS | Status: AC
  Administered 2019-07-13: 09:00:00 via INTRAVENOUS
  Filled 2019-07-13: qty 250

## 2019-07-13 MED ORDER — SODIUM CHLORIDE 0.9 % IV SOLN
1000.0000 mg/m2 | Freq: Once | INTRAVENOUS | Status: AC
  Administered 2019-07-13: 1710 mg via INTRAVENOUS
  Filled 2019-07-13: qty 44.97

## 2019-07-13 MED ORDER — SODIUM CHLORIDE 0.9% FLUSH
10.0000 mL | INTRAVENOUS | Status: DC | PRN
  Administered 2019-07-13: 10 mL
  Filled 2019-07-13: qty 10

## 2019-07-13 MED ORDER — SODIUM CHLORIDE 0.9 % IV SOLN
70.0000 mg/m2 | Freq: Once | INTRAVENOUS | Status: AC
  Administered 2019-07-13: 119 mg via INTRAVENOUS
  Filled 2019-07-13: qty 119

## 2019-07-13 MED ORDER — PALONOSETRON HCL INJECTION 0.25 MG/5ML
INTRAVENOUS | Status: AC
  Filled 2019-07-13: qty 5

## 2019-07-13 MED ORDER — SODIUM CHLORIDE 0.9 % IV SOLN
Freq: Once | INTRAVENOUS | Status: AC
  Administered 2019-07-13: 12:00:00 via INTRAVENOUS
  Filled 2019-07-13: qty 5

## 2019-07-13 MED ORDER — PALONOSETRON HCL INJECTION 0.25 MG/5ML
0.2500 mg | Freq: Once | INTRAVENOUS | Status: AC
  Administered 2019-07-13: 0.25 mg via INTRAVENOUS

## 2019-07-13 MED ORDER — POTASSIUM CHLORIDE 2 MEQ/ML IV SOLN
Freq: Once | INTRAVENOUS | Status: AC
  Administered 2019-07-13: 10:00:00 via INTRAVENOUS
  Filled 2019-07-13: qty 10

## 2019-07-13 MED ORDER — HEPARIN SOD (PORK) LOCK FLUSH 100 UNIT/ML IV SOLN
500.0000 [IU] | Freq: Once | INTRAVENOUS | Status: AC | PRN
  Administered 2019-07-13: 500 [IU]
  Filled 2019-07-13: qty 5

## 2019-07-13 NOTE — Patient Instructions (Signed)
[facility] Discharge Instructions for Patients Receiving Chemotherapy  Today you received the following chemotherapy agents Gemzar and Cisplatin  To help prevent nausea and vomiting after your treatment, we encourage you to take your nausea medication as directed   If you develop nausea and vomiting that is not controlled by your nausea medication, call the clinic.   BELOW ARE SYMPTOMS THAT SHOULD BE REPORTED IMMEDIATELY:  *FEVER GREATER THAN 100.5 F  *CHILLS WITH OR WITHOUT FEVER  NAUSEA AND VOMITING THAT IS NOT CONTROLLED WITH YOUR NAUSEA MEDICATION  *UNUSUAL SHORTNESS OF BREATH  *UNUSUAL BRUISING OR BLEEDING  TENDERNESS IN MOUTH AND THROAT WITH OR WITHOUT PRESENCE OF ULCERS  *URINARY PROBLEMS  *BOWEL PROBLEMS  UNUSUAL RASH Items with * indicate a potential emergency and should be followed up as soon as possible.  Feel free to call the clinic should you have any questions or concerns. The clinic phone number is [phone].  Please show the CHEMO ALERT CARD at check-in to the Emergency Department and triage nurse.   

## 2019-07-20 ENCOUNTER — Inpatient Hospital Stay: Payer: Medicare Other

## 2019-07-20 ENCOUNTER — Inpatient Hospital Stay: Payer: Medicare Other | Attending: Oncology

## 2019-07-20 ENCOUNTER — Other Ambulatory Visit: Payer: Self-pay

## 2019-07-20 VITALS — BP 143/75 | HR 51 | Temp 98.3°F | Resp 16 | Wt 138.5 lb

## 2019-07-20 DIAGNOSIS — Z906 Acquired absence of other parts of urinary tract: Secondary | ICD-10-CM | POA: Insufficient documentation

## 2019-07-20 DIAGNOSIS — Z5111 Encounter for antineoplastic chemotherapy: Secondary | ICD-10-CM | POA: Insufficient documentation

## 2019-07-20 DIAGNOSIS — Z7689 Persons encountering health services in other specified circumstances: Secondary | ICD-10-CM | POA: Insufficient documentation

## 2019-07-20 DIAGNOSIS — Z79899 Other long term (current) drug therapy: Secondary | ICD-10-CM | POA: Diagnosis not present

## 2019-07-20 DIAGNOSIS — C7800 Secondary malignant neoplasm of unspecified lung: Secondary | ICD-10-CM | POA: Diagnosis not present

## 2019-07-20 DIAGNOSIS — C670 Malignant neoplasm of trigone of bladder: Secondary | ICD-10-CM

## 2019-07-20 DIAGNOSIS — R5383 Other fatigue: Secondary | ICD-10-CM | POA: Insufficient documentation

## 2019-07-20 DIAGNOSIS — Z95828 Presence of other vascular implants and grafts: Secondary | ICD-10-CM

## 2019-07-20 DIAGNOSIS — Z7982 Long term (current) use of aspirin: Secondary | ICD-10-CM | POA: Diagnosis not present

## 2019-07-20 DIAGNOSIS — C679 Malignant neoplasm of bladder, unspecified: Secondary | ICD-10-CM

## 2019-07-20 LAB — CMP (CANCER CENTER ONLY)
ALT: 15 U/L (ref 0–44)
AST: 16 U/L (ref 15–41)
Albumin: 3.9 g/dL (ref 3.5–5.0)
Alkaline Phosphatase: 42 U/L (ref 38–126)
Anion gap: 9 (ref 5–15)
BUN: 15 mg/dL (ref 8–23)
CO2: 24 mmol/L (ref 22–32)
Calcium: 8.7 mg/dL — ABNORMAL LOW (ref 8.9–10.3)
Chloride: 105 mmol/L (ref 98–111)
Creatinine: 0.85 mg/dL (ref 0.61–1.24)
GFR, Est AFR Am: >60 mL/min (ref >60)
GFR, Estimated: >60 mL/min (ref >60)
Glucose, Bld: 84 mg/dL (ref 70–99)
Potassium: 4.7 mmol/L (ref 3.5–5.1)
Sodium: 138 mmol/L (ref 135–145)
Total Bilirubin: 0.4 mg/dL (ref 0.3–1.2)
Total Protein: 6.7 g/dL (ref 6.5–8.1)

## 2019-07-20 LAB — CBC WITH DIFFERENTIAL (CANCER CENTER ONLY)
Abs Immature Granulocytes: 0.01 10*3/uL (ref 0.00–0.07)
Basophils Absolute: 0 10*3/uL (ref 0.0–0.1)
Basophils Relative: 1 %
Eosinophils Absolute: 0 10*3/uL (ref 0.0–0.5)
Eosinophils Relative: 1 %
HCT: 31.2 % — ABNORMAL LOW (ref 39.0–52.0)
Hemoglobin: 10.3 g/dL — ABNORMAL LOW (ref 13.0–17.0)
Immature Granulocytes: 0 %
Lymphocytes Relative: 67 %
Lymphs Abs: 1.5 10*3/uL (ref 0.7–4.0)
MCH: 29.9 pg (ref 26.0–34.0)
MCHC: 33 g/dL (ref 30.0–36.0)
MCV: 90.7 fL (ref 80.0–100.0)
Monocytes Absolute: 0.1 10*3/uL (ref 0.1–1.0)
Monocytes Relative: 5 %
Neutro Abs: 0.6 10*3/uL — ABNORMAL LOW (ref 1.7–7.7)
Neutrophils Relative %: 26 %
Platelet Count: 179 10*3/uL (ref 150–400)
RBC: 3.44 MIL/uL — ABNORMAL LOW (ref 4.22–5.81)
RDW: 13.6 % (ref 11.5–15.5)
WBC Count: 2.3 10*3/uL — ABNORMAL LOW (ref 4.0–10.5)
nRBC: 0 % (ref 0.0–0.2)

## 2019-07-20 MED ORDER — PROCHLORPERAZINE MALEATE 10 MG PO TABS
ORAL_TABLET | ORAL | Status: AC
  Filled 2019-07-20: qty 1

## 2019-07-20 MED ORDER — PROCHLORPERAZINE MALEATE 10 MG PO TABS
10.0000 mg | ORAL_TABLET | Freq: Once | ORAL | Status: AC
  Administered 2019-07-20: 10 mg via ORAL

## 2019-07-20 MED ORDER — SODIUM CHLORIDE 0.9 % IV SOLN
1000.0000 mg/m2 | Freq: Once | INTRAVENOUS | Status: AC
  Administered 2019-07-20: 1710 mg via INTRAVENOUS
  Filled 2019-07-20: qty 44.97

## 2019-07-20 MED ORDER — HEPARIN SOD (PORK) LOCK FLUSH 100 UNIT/ML IV SOLN
500.0000 [IU] | Freq: Once | INTRAVENOUS | Status: AC | PRN
  Administered 2019-07-20: 500 [IU]
  Filled 2019-07-20: qty 5

## 2019-07-20 MED ORDER — SODIUM CHLORIDE 0.9 % IV SOLN
Freq: Once | INTRAVENOUS | Status: AC
  Administered 2019-07-20: 10:00:00 via INTRAVENOUS
  Filled 2019-07-20: qty 250

## 2019-07-20 MED ORDER — SODIUM CHLORIDE 0.9% FLUSH
10.0000 mL | Freq: Once | INTRAVENOUS | Status: AC
  Administered 2019-07-20: 10 mL
  Filled 2019-07-20: qty 10

## 2019-07-20 MED ORDER — SODIUM CHLORIDE 0.9% FLUSH
10.0000 mL | INTRAVENOUS | Status: DC | PRN
  Administered 2019-07-20: 10 mL
  Filled 2019-07-20: qty 10

## 2019-07-20 NOTE — Patient Instructions (Signed)
[facility] Discharge Instructions for Patients Receiving Chemotherapy  Today you received the following chemotherapy agents: Gemcitabine (Gemzar)  To help prevent nausea and vomiting after your treatment, we encourage you to take your nausea medication as directed.   If you develop nausea and vomiting that is not controlled by your nausea medication, call the clinic.   BELOW ARE SYMPTOMS THAT SHOULD BE REPORTED IMMEDIATELY:  *FEVER GREATER THAN 100.5 F  *CHILLS WITH OR WITHOUT FEVER  NAUSEA AND VOMITING THAT IS NOT CONTROLLED WITH YOUR NAUSEA MEDICATION  *UNUSUAL SHORTNESS OF BREATH  *UNUSUAL BRUISING OR BLEEDING  TENDERNESS IN MOUTH AND THROAT WITH OR WITHOUT PRESENCE OF ULCERS  *URINARY PROBLEMS  *BOWEL PROBLEMS  UNUSUAL RASH Items with * indicate a potential emergency and should be followed up as soon as possible.  Feel free to call the clinic should you have any questions or concerns. The clinic phone number is [phone].  Please show the [place] at check-in to the Emergency Department and triage nurse.  Coronavirus (COVID-19) Are you at risk?  Are you at risk for the Coronavirus (COVID-19)?  To be considered HIGH RISK for Coronavirus (COVID-19), you have to meet the following criteria:  . Traveled to [place], [place], [place], [place] or [place]; or in the [place] to [place], [place], [place], or [place]; and have fever, cough, and shortness of breath within the last 2 weeks of travel OR . Been in close contact with a person diagnosed with COVID-19 within the last 2 weeks and have fever, cough, and shortness of breath . IF YOU DO NOT MEET THESE CRITERIA, YOU ARE CONSIDERED LOW RISK FOR COVID-19.  What to do if you are HIGH RISK for COVID-19?  Marland Kitchen If you are having a medical emergency, call 911. . Seek medical care right away. Before you go to a doctor's office, urgent care or emergency department, call ahead and tell them  about your recent travel, contact with someone diagnosed with COVID-19, and your symptoms. You should receive instructions from your physician's office regarding next steps of care.  . When you arrive at healthcare provider, tell the healthcare staff immediately you have returned from visiting [place], [place], [place], [place] or [place]; or traveled in the [place] to [place], [place], [place], or [place]; in the last two weeks or you have been in close contact with a person diagnosed with COVID-19 in the last 2 weeks.   . Tell the health care staff about your symptoms: fever, cough and shortness of breath. . After you have been seen by a medical provider, you will be either: o Tested for (COVID-19) and discharged home on quarantine except to seek medical care if symptoms worsen, and asked to  - Stay home and avoid contact with others until you get your results (4-5 days)  - Avoid travel on public transportation if possible (such as bus, train, or airplane) or o Sent to the Emergency Department by EMS for evaluation, COVID-19 testing, and possible admission depending on your condition and test results.  What to do if you are LOW RISK for COVID-19?  Reduce your risk of any infection by using the same precautions used for avoiding the common cold or flu:  Marland Kitchen Wash your hands often with soap and warm water for at least 20 seconds.  If soap and water are not readily available, use an alcohol-based hand sanitizer with at least 60% alcohol.  . If coughing or  sneezing, cover your mouth and nose by coughing or sneezing into the elbow areas of your shirt or coat, into a tissue or into your sleeve (not your hands). . Avoid shaking hands with others and consider head nods or verbal greetings only. . Avoid touching your eyes, nose, or mouth with unwashed hands.  . Avoid close contact with people who are sick. . Avoid places or events with large numbers of people in one location, like concerts or  sporting events. . Carefully consider travel plans you have or are making. . If you are planning any travel outside or inside the [place], visit the CDC's Travelers' Health webpage for the latest health notices. . If you have some symptoms but not all symptoms, continue to monitor at home and seek medical attention if your symptoms worsen. . If you are having a medical emergency, call 911.   [place] / e-Visit: [email or web address]         [facility]: [facility]: [phone]                   [facility]: [phone]

## 2019-07-20 NOTE — Progress Notes (Signed)
Okay to treat Gemzar D8C3 ANC 0.6 per Dr. Alen Blew. Patient will receive growth factor inj tomorrow 8/7.

## 2019-07-20 NOTE — Patient Instructions (Signed)

## 2019-07-21 ENCOUNTER — Other Ambulatory Visit: Payer: Self-pay

## 2019-07-21 ENCOUNTER — Inpatient Hospital Stay: Payer: Medicare Other

## 2019-07-21 VITALS — BP 128/72 | HR 62 | Temp 98.2°F | Resp 16

## 2019-07-21 DIAGNOSIS — C670 Malignant neoplasm of trigone of bladder: Secondary | ICD-10-CM

## 2019-07-21 DIAGNOSIS — Z95828 Presence of other vascular implants and grafts: Secondary | ICD-10-CM

## 2019-07-21 DIAGNOSIS — Z5111 Encounter for antineoplastic chemotherapy: Secondary | ICD-10-CM | POA: Diagnosis not present

## 2019-07-21 MED ORDER — PEGFILGRASTIM-JMDB 6 MG/0.6ML ~~LOC~~ SOSY
6.0000 mg | PREFILLED_SYRINGE | Freq: Once | SUBCUTANEOUS | Status: AC
  Administered 2019-07-21: 12:00:00 6 mg via SUBCUTANEOUS
  Filled 2019-07-21: qty 0.6

## 2019-07-21 NOTE — Patient Instructions (Signed)

## 2019-07-25 ENCOUNTER — Encounter: Payer: Self-pay | Admitting: Oncology

## 2019-07-31 ENCOUNTER — Telehealth: Payer: Self-pay

## 2019-07-31 ENCOUNTER — Other Ambulatory Visit: Payer: Self-pay | Admitting: Oncology

## 2019-07-31 NOTE — Telephone Encounter (Signed)
-----   Message from Wyatt Portela, MD sent at 07/31/2019 11:51 AM EDT ----- He needs to see an eye doctor regarding these issues. Thanks.  ----- Message ----- From: Tami Lin, RN Sent: 07/31/2019   9:45 AM EDT To: Wyatt Portela, MD  Patient's son Shanon Brow called and stated since Friday night patient has bloodshot eyes, sticky tears, and eye pressure/pain 4 out of 10 in both eyes. Denies nausea, fever, or any other symptoms. Patient used Refresh Optive gel drop lubricant eye gel with no relief. Son said he called the triage line last night and was only instructed to call the office today. Lanelle Bal

## 2019-07-31 NOTE — Telephone Encounter (Signed)
Called patient's son Shanon Brow and let him know that per Dr. Alen Blew, patient needs to see an eye doctor. Shanon Brow verbalized understanding and stated patient has an appointment this afternoon with an eye doctor.

## 2019-08-01 ENCOUNTER — Encounter (HOSPITAL_COMMUNITY)
Admission: RE | Admit: 2019-08-01 | Discharge: 2019-08-01 | Disposition: A | Discharge status: Home / Self Care | Payer: Medicare Other | Source: Ambulatory Visit | Attending: Oncology | Admitting: Oncology

## 2019-08-01 ENCOUNTER — Other Ambulatory Visit: Payer: Self-pay

## 2019-08-01 DIAGNOSIS — C7800 Secondary malignant neoplasm of unspecified lung: Secondary | ICD-10-CM | POA: Diagnosis not present

## 2019-08-01 DIAGNOSIS — Z79899 Other long term (current) drug therapy: Secondary | ICD-10-CM | POA: Diagnosis not present

## 2019-08-01 DIAGNOSIS — C670 Malignant neoplasm of trigone of bladder: Secondary | ICD-10-CM | POA: Insufficient documentation

## 2019-08-01 DIAGNOSIS — Z7982 Long term (current) use of aspirin: Secondary | ICD-10-CM | POA: Insufficient documentation

## 2019-08-01 DIAGNOSIS — R918 Other nonspecific abnormal finding of lung field: Secondary | ICD-10-CM | POA: Insufficient documentation

## 2019-08-01 LAB — GLUCOSE, CAPILLARY: Glucose-Capillary: 105 mg/dL — ABNORMAL HIGH (ref 70–99)

## 2019-08-01 MED ORDER — FLUDEOXYGLUCOSE F - 18 (FDG) INJECTION
6.9400 | Freq: Once | INTRAVENOUS | Status: AC | PRN
  Administered 2019-08-01: 08:00:00 6.94 via INTRAVENOUS

## 2019-08-03 ENCOUNTER — Inpatient Hospital Stay (HOSPITAL_BASED_OUTPATIENT_CLINIC_OR_DEPARTMENT_OTHER): Payer: Medicare Other | Admitting: Oncology

## 2019-08-03 ENCOUNTER — Other Ambulatory Visit: Payer: Self-pay

## 2019-08-03 ENCOUNTER — Inpatient Hospital Stay: Payer: Medicare Other

## 2019-08-03 VITALS — BP 120/62 | HR 61 | Temp 98.7°F | Resp 18 | Ht 64.0 in | Wt 138.6 lb

## 2019-08-03 DIAGNOSIS — C679 Malignant neoplasm of bladder, unspecified: Secondary | ICD-10-CM

## 2019-08-03 DIAGNOSIS — C670 Malignant neoplasm of trigone of bladder: Secondary | ICD-10-CM | POA: Diagnosis not present

## 2019-08-03 DIAGNOSIS — Z5111 Encounter for antineoplastic chemotherapy: Secondary | ICD-10-CM | POA: Diagnosis not present

## 2019-08-03 LAB — CBC WITH DIFFERENTIAL (CANCER CENTER ONLY)
Abs Immature Granulocytes: 0.44 10*3/uL — ABNORMAL HIGH (ref 0.00–0.07)
Basophils Absolute: 0 10*3/uL (ref 0.0–0.1)
Basophils Relative: 0 %
Eosinophils Absolute: 0.1 10*3/uL (ref 0.0–0.5)
Eosinophils Relative: 1 %
HCT: 28.4 % — ABNORMAL LOW (ref 39.0–52.0)
Hemoglobin: 9.3 g/dL — ABNORMAL LOW (ref 13.0–17.0)
Immature Granulocytes: 5 %
Lymphocytes Relative: 23 %
Lymphs Abs: 2.1 10*3/uL (ref 0.7–4.0)
MCH: 30.2 pg (ref 26.0–34.0)
MCHC: 32.7 g/dL (ref 30.0–36.0)
MCV: 92.2 fL (ref 80.0–100.0)
Monocytes Absolute: 0.8 10*3/uL (ref 0.1–1.0)
Monocytes Relative: 9 %
Neutro Abs: 5.5 10*3/uL (ref 1.7–7.7)
Neutrophils Relative %: 62 %
Platelet Count: 367 10*3/uL (ref 150–400)
RBC: 3.08 MIL/uL — ABNORMAL LOW (ref 4.22–5.81)
RDW: 15.5 % (ref 11.5–15.5)
WBC Count: 9 10*3/uL (ref 4.0–10.5)
nRBC: 0.3 % — ABNORMAL HIGH (ref 0.0–0.2)

## 2019-08-03 LAB — CMP (CANCER CENTER ONLY)
ALT: 11 U/L (ref 0–44)
AST: 14 U/L — ABNORMAL LOW (ref 15–41)
Albumin: 3.8 g/dL (ref 3.5–5.0)
Alkaline Phosphatase: 72 U/L (ref 38–126)
Anion gap: 7 (ref 5–15)
BUN: 19 mg/dL (ref 8–23)
CO2: 27 mmol/L (ref 22–32)
Calcium: 8.3 mg/dL — ABNORMAL LOW (ref 8.9–10.3)
Chloride: 108 mmol/L (ref 98–111)
Creatinine: 0.91 mg/dL (ref 0.61–1.24)
GFR, Est AFR Am: >60 mL/min (ref >60)
GFR, Estimated: >60 mL/min (ref >60)
Glucose, Bld: 121 mg/dL — ABNORMAL HIGH (ref 70–99)
Potassium: 4.6 mmol/L (ref 3.5–5.1)
Sodium: 142 mmol/L (ref 135–145)
Total Bilirubin: 0.2 mg/dL — ABNORMAL LOW (ref 0.3–1.2)
Total Protein: 6.7 g/dL (ref 6.5–8.1)

## 2019-08-03 NOTE — Progress Notes (Signed)
Hematology and Oncology Follow Up Visit  Robert Lowery [id number] March 11, 1956 63 y.o. 08/03/2019 11:55 AM Robert Lowery, MDMoreira, Robert Manner, MD   Principle Diagnosis: 63 year old with stage IV bladder cancer with lung metastasis confirmed in June 2020.  He was initially diagnosed with localized bladder cancer in 2019.     Prior Therapy:   He is status post cystectomy done in July 2019.  Surgery performed in [place]. He is status post lung biopsy completed on June 2 of 2020.   Current therapy: Gemcitabine and cisplatin chemotherapy cycle 1 started on 06/02/2019.  He is status post 3 cycles of therapy.    Interim History: Robert Lowery is here for a follow-up visit.  Since last visit, he reports no major changes in his health.  He continues to tolerate chemotherapy without any new complaints.  He does report some mild fatigue but no nausea vomiting or peripheral neuropathy.  He continues to be active and attends activities of daily living.  His appetite and performance status remains unchanged.  He denies any complications related to growth factor support injections.  Denies any arthralgias or myalgias.    Patient denied headaches, blurry vision, syncope or seizures.  Denies any fevers, chills or sweats.  Denied chest pain, palpitation, orthopnea or leg edema.  Denied cough, wheezing or hemoptysis.  Denied nausea, vomiting or abdominal pain.  Denies any constipation or diarrhea.  Denies any frequency urgency or hesitancy.  Denies any arthralgias or myalgias.  Denies any skin rashes or lesions.  Denies any bleeding or clotting tendency.  Denies any easy bruising.  Denies any hair or nail changes.  Denies any anxiety or depression.  Remaining review of system is negative.       Medications: Updated today without change Current Outpatient Medications  Medication Sig Dispense Refill  . aspirin 81 MG chewable tablet Chew 1 tablet (81 mg total) by mouth daily.    Marland Kitchen lidocaine-prilocaine (EMLA) cream  APPLY ONE APPLICATION TOPICALLY AS NEEDED 30 g 0  . methocarbamol (ROBAXIN) 500 MG tablet Take 1 tablet (500 mg total) by mouth 4 (four) times daily. 40 tablet 0  . prochlorperazine (COMPAZINE) 10 MG tablet TAKE ONE TABLET BY MOUTH EVERY 6 HOURS AS NEEDED FOR NAUSEA OR VOMITING 30 tablet 0   No current facility-administered medications for this visit.      Allergies: No Known Allergies  Past Medical History, Surgical history, Social history, and Family History remains without any change in review.   Physical Exam:    Blood pressure 120/62, pulse 61, temperature 98.7 F (37.1 C), temperature source Oral, resp. rate 18, height 5\' 4"  (1.626 m), weight 138 lb 9.6 oz (62.9 kg), SpO2 100 %.    ECOG: 0     General appearance: Comfortable appearing without any discomfort Head: Normocephalic without any trauma Oropharynx: Mucous membranes are moist and pink without any thrush or ulcers. Eyes: Pupils are equal and round reactive to light. Lymph nodes: No cervical, supraclavicular, inguinal or axillary lymphadenopathy.   Heart:regular rate and rhythm.  S1 and S2 without leg edema. Lung: Clear without any rhonchi or wheezes.  No dullness to percussion. Abdomin: Soft, nontender, nondistended with good bowel sounds.  No hepatosplenomegaly. Musculoskeletal: No joint deformity or effusion.  Full range of motion noted. Neurological: No deficits noted on motor, sensory and deep tendon reflex exam. Skin: No petechial rash or dryness.  Appeared moist.       Lab Results: Lab Results  Component Value Date   WBC 2.3 (  L) 07/20/2019   HGB 10.3 (L) 07/20/2019   HCT 31.2 (L) 07/20/2019   MCV 90.7 07/20/2019   PLT 179 07/20/2019     Chemistry      Component Value Date/Time   NA 138 07/20/2019 0825   K 4.7 07/20/2019 0825   CL 105 07/20/2019 0825   CO2 24 07/20/2019 0825   BUN 15 07/20/2019 0825   CREATININE 0.85 07/20/2019 0825      Component Value Date/Time   CALCIUM 8.7 (L)  07/20/2019 0825   ALKPHOS 42 07/20/2019 0825   AST 16 07/20/2019 0825   ALT 15 07/20/2019 0825   BILITOT 0.4 07/20/2019 0825       CLINICAL DATA:  Subsequent treatment strategy for bladder carcinoma.  EXAM: NUCLEAR MEDICINE PET SKULL BASE TO THIGH  TECHNIQUE: 6.94 mCi F-18 FDG was injected intravenously. Full-ring PET imaging was performed from the skull base to thigh after the radiotracer. CT data was obtained and used for attenuation correction and anatomic localization.  Fasting blood glucose: 105 mg/dl  COMPARISON:  None.  FINDINGS: Mediastinal blood pool activity: SUV max 1.73  Liver activity: SUV max NA  NECK: No hypermetabolic lymph nodes in the neck.  Incidental CT findings: none  CHEST: RIGHT upper lobe pulmonary nodules increased decreased in size and metabolic activity measuring 17 mm x 7 mm (image 26/8) decreased from 21 x 17 mm on PET-CT 05/09/2019. Lesion is decreased significant metabolic activity SUV max equal 3.2 decreased from SUV max equal 13.3.  Calcified nodules in the RIGHT upper lobe without metabolic activity again noted. No new pulmonary nodules.  No significant change hypermetabolic small hilar lymph nodes. Example lesion in the RIGHT hilum with SUV max equal 8.7 compared SUV max equal 11.7. LEFT hilar lymph node with SUV max equal 4.8 compared to SUV max equal 6.5.  There are calcified mediastinal lymph nodes additionally.  Incidental CT findings: none  ABDOMEN/PELVIS: Bladder is irregular shape consistent with prior surgery and presumed neobladder formation. No hypermetabolic lesions external the bladder within the pelvis. No hypermetabolic abdominal lymphadenopathy.  No solid organ metastasis in the abdomen pelvis.  Incidental CT findings: Benign hepatic cysts noted. No hydronephrosis. Kidneys are grossly normal.  SKELETON: No focal hypermetabolic activity to suggest skeletal metastasis. Uniform increase in  marrow activity favored related to GCS type response.  Incidental CT findings: none  IMPRESSION: 1. Significant decrease in size and metabolic activity of RIGHT upper lobe pulmonary metastasis. 2. Persistent but mildly decreased activity of hilar lymph nodes which are very small. Differential would include inflammatory nodules related to granulomatous disease versus partial response to chemotherapy of metastatic nodules. Favor inflammatory nodules. Additional evidence of granulomatous disease included calcified nodes and calcified upper lobe pulmonary nodules. 3. No evidence of bladder cancer recurrence in the abdomen or pelvis.  Impression and Plan:   63 year old man with  1.  Bladder cancer diagnosed in 2019.  He subsequently developed stage IV disease with pulmonary metastasis biopsy-proven.  He is status post 3 cycles of chemotherapy with excellent tolerance.  PET CT scan obtained on 08/01/2019 was personally reviewed and discussed with the patient.  He has decrease in the size and the hypermetabolic activity of his pulmonary metastasis at this time.  The natural course of this disease and treatment options moving forward were reviewed.  I have recommended completing 6 cycles of chemotherapy and subsequently will evaluate with imaging studies.  Local therapy may be reasonable at this time if he continues to have isolated pulmonary  metastasis.  That could be done by surgical resection versus stereotactic radiosurgery.  After discussion he is agreeable to proceed.   2. IV access:Port-A-Cath continues to be in use without any issues.  3. Antiemetics: Updated today without any changes.  4. Renal function surveillance:Creatinine remains stable without any issues on cisplatin therapy.  5. Goals of care:  His disease is incurable although aggressive measures are warranted given his excellent performance status and limited disease.  6.  Neutropenia: He will require  growth factor support after each cycle of therapy.  He is at risk of neutropenic sepsis.  7. Follow-up: Will be on August 21 to start cycle 4 of therapy in 3 weeks or the beginning of cycle 5.   25  minutes was spent with the patient face-to-face today.  More than 50% of time was dedicated to reviewing imaging studies, disease status update and treatment options currently and for the future.      Zola Button, MD 8/20/202011:55 AM

## 2019-08-04 ENCOUNTER — Inpatient Hospital Stay: Payer: Medicare Other

## 2019-08-04 ENCOUNTER — Telehealth: Payer: Self-pay | Admitting: Oncology

## 2019-08-04 ENCOUNTER — Other Ambulatory Visit: Payer: Self-pay

## 2019-08-04 VITALS — BP 118/51 | HR 64 | Temp 98.5°F | Resp 18

## 2019-08-04 DIAGNOSIS — C670 Malignant neoplasm of trigone of bladder: Secondary | ICD-10-CM

## 2019-08-04 DIAGNOSIS — Z5111 Encounter for antineoplastic chemotherapy: Secondary | ICD-10-CM | POA: Diagnosis not present

## 2019-08-04 DIAGNOSIS — Z95828 Presence of other vascular implants and grafts: Secondary | ICD-10-CM

## 2019-08-04 DIAGNOSIS — C679 Malignant neoplasm of bladder, unspecified: Secondary | ICD-10-CM

## 2019-08-04 MED ORDER — SODIUM CHLORIDE 0.9 % IV SOLN
70.0000 mg/m2 | Freq: Once | INTRAVENOUS | Status: AC
  Administered 2019-08-04: 14:00:00 119 mg via INTRAVENOUS
  Filled 2019-08-04: qty 119

## 2019-08-04 MED ORDER — SODIUM CHLORIDE 0.9% FLUSH
10.0000 mL | Freq: Once | INTRAVENOUS | Status: AC
  Administered 2019-08-04: 10 mL
  Filled 2019-08-04: qty 10

## 2019-08-04 MED ORDER — SODIUM CHLORIDE 0.9 % IV SOLN: 1000.0000 mg/m2 | Freq: Once | INTRAVENOUS | Status: DC

## 2019-08-04 MED ORDER — SODIUM CHLORIDE 0.9 % IV SOLN
1000.0000 mg/m2 | Freq: Once | INTRAVENOUS | Status: AC
  Administered 2019-08-04: 13:00:00 1710 mg via INTRAVENOUS
  Filled 2019-08-04: qty 44.97

## 2019-08-04 MED ORDER — SODIUM CHLORIDE 0.9 % IV SOLN
Freq: Once | INTRAVENOUS | Status: AC
  Administered 2019-08-04: 12:00:00 via INTRAVENOUS
  Filled 2019-08-04: qty 5

## 2019-08-04 MED ORDER — PALONOSETRON HCL INJECTION 0.25 MG/5ML
0.2500 mg | Freq: Once | INTRAVENOUS | Status: AC
  Administered 2019-08-04: 12:00:00 0.25 mg via INTRAVENOUS

## 2019-08-04 MED ORDER — PALONOSETRON HCL INJECTION 0.25 MG/5ML
INTRAVENOUS | Status: AC
  Filled 2019-08-04: qty 5

## 2019-08-04 MED ORDER — POTASSIUM CHLORIDE 2 MEQ/ML IV SOLN
Freq: Once | INTRAVENOUS | Status: AC
  Administered 2019-08-04: 10:00:00 via INTRAVENOUS
  Filled 2019-08-04: qty 10

## 2019-08-04 MED ORDER — HEPARIN SOD (PORK) LOCK FLUSH 100 UNIT/ML IV SOLN
500.0000 [IU] | Freq: Once | INTRAVENOUS | Status: AC | PRN
  Administered 2019-08-04: 16:00:00 500 [IU]
  Filled 2019-08-04: qty 5

## 2019-08-04 MED ORDER — SODIUM CHLORIDE 0.9 % IV SOLN
Freq: Once | INTRAVENOUS | Status: AC
  Administered 2019-08-04: 10:00:00 via INTRAVENOUS
  Filled 2019-08-04: qty 250

## 2019-08-04 MED ORDER — SODIUM CHLORIDE 0.9% FLUSH
10.0000 mL | INTRAVENOUS | Status: DC | PRN
  Administered 2019-08-04: 10 mL
  Filled 2019-08-04: qty 10

## 2019-08-04 NOTE — Patient Instructions (Signed)
[facility] Discharge Instructions for Patients Receiving Chemotherapy  Today you received the following chemotherapy agents Gemzar and Cisplatin  To help prevent nausea and vomiting after your treatment, we encourage you to take your nausea medication as directed   If you develop nausea and vomiting that is not controlled by your nausea medication, call the clinic.   BELOW ARE SYMPTOMS THAT SHOULD BE REPORTED IMMEDIATELY:  *FEVER GREATER THAN 100.5 F  *CHILLS WITH OR WITHOUT FEVER  NAUSEA AND VOMITING THAT IS NOT CONTROLLED WITH YOUR NAUSEA MEDICATION  *UNUSUAL SHORTNESS OF BREATH  *UNUSUAL BRUISING OR BLEEDING  TENDERNESS IN MOUTH AND THROAT WITH OR WITHOUT PRESENCE OF ULCERS  *URINARY PROBLEMS  *BOWEL PROBLEMS  UNUSUAL RASH Items with * indicate a potential emergency and should be followed up as soon as possible.  Feel free to call the clinic should you have any questions or concerns. The clinic phone number is [phone].  Please show the CHEMO ALERT CARD at check-in to the Emergency Department and triage nurse.   

## 2019-08-04 NOTE — Telephone Encounter (Signed)
Scheduled per sch msg. Printout will be given at next appt per request

## 2019-08-11 ENCOUNTER — Inpatient Hospital Stay: Payer: Medicare Other

## 2019-08-11 ENCOUNTER — Other Ambulatory Visit: Payer: Self-pay

## 2019-08-11 DIAGNOSIS — Z95828 Presence of other vascular implants and grafts: Secondary | ICD-10-CM

## 2019-08-11 DIAGNOSIS — C670 Malignant neoplasm of trigone of bladder: Secondary | ICD-10-CM

## 2019-08-11 DIAGNOSIS — Z5111 Encounter for antineoplastic chemotherapy: Secondary | ICD-10-CM | POA: Diagnosis not present

## 2019-08-11 DIAGNOSIS — C679 Malignant neoplasm of bladder, unspecified: Secondary | ICD-10-CM

## 2019-08-11 LAB — CMP (CANCER CENTER ONLY)
ALT: 10 U/L (ref 0–44)
AST: 12 U/L — ABNORMAL LOW (ref 15–41)
Albumin: 3.6 g/dL (ref 3.5–5.0)
Alkaline Phosphatase: 44 U/L (ref 38–126)
Anion gap: 9 (ref 5–15)
BUN: 13 mg/dL (ref 8–23)
CO2: 24 mmol/L (ref 22–32)
Calcium: 8.3 mg/dL — ABNORMAL LOW (ref 8.9–10.3)
Chloride: 104 mmol/L (ref 98–111)
Creatinine: 0.89 mg/dL (ref 0.61–1.24)
GFR, Est AFR Am: >60 mL/min (ref >60)
GFR, Estimated: >60 mL/min (ref >60)
Glucose, Bld: 106 mg/dL — ABNORMAL HIGH (ref 70–99)
Potassium: 5 mmol/L (ref 3.5–5.1)
Sodium: 137 mmol/L (ref 135–145)
Total Bilirubin: 0.4 mg/dL (ref 0.3–1.2)
Total Protein: 6.2 g/dL — ABNORMAL LOW (ref 6.5–8.1)

## 2019-08-11 LAB — CBC WITH DIFFERENTIAL (CANCER CENTER ONLY)
Abs Immature Granulocytes: 0.01 10*3/uL (ref 0.00–0.07)
Basophils Absolute: 0 10*3/uL (ref 0.0–0.1)
Basophils Relative: 1 %
Eosinophils Absolute: 0 10*3/uL (ref 0.0–0.5)
Eosinophils Relative: 1 %
HCT: 23.7 % — ABNORMAL LOW (ref 39.0–52.0)
Hemoglobin: 7.8 g/dL — ABNORMAL LOW (ref 13.0–17.0)
Immature Granulocytes: 1 %
Lymphocytes Relative: 62 %
Lymphs Abs: 1.2 10*3/uL (ref 0.7–4.0)
MCH: 30.8 pg (ref 26.0–34.0)
MCHC: 32.9 g/dL (ref 30.0–36.0)
MCV: 93.7 fL (ref 80.0–100.0)
Monocytes Absolute: 0.2 10*3/uL (ref 0.1–1.0)
Monocytes Relative: 9 %
Neutro Abs: 0.5 10*3/uL — ABNORMAL LOW (ref 1.7–7.7)
Neutrophils Relative %: 26 %
Platelet Count: 432 10*3/uL — ABNORMAL HIGH (ref 150–400)
RBC: 2.53 MIL/uL — ABNORMAL LOW (ref 4.22–5.81)
RDW: 15.3 % (ref 11.5–15.5)
WBC Count: 1.9 10*3/uL — ABNORMAL LOW (ref 4.0–10.5)
nRBC: 0 % (ref 0.0–0.2)

## 2019-08-11 MED ORDER — SODIUM CHLORIDE 0.9% FLUSH
10.0000 mL | Freq: Once | INTRAVENOUS | Status: AC
  Administered 2019-08-11: 08:00:00 10 mL
  Filled 2019-08-11: qty 10

## 2019-08-11 NOTE — Progress Notes (Signed)
Patient's Hgb 7.8 and ANC 0.5. Per Dr. Alen Blew, hold treatment today, get injection on 08/29 and return for next appointment. Patient informed and he verbalized understanding. Printed copy of labs provided.

## 2019-08-11 NOTE — Patient Instructions (Signed)

## 2019-08-12 ENCOUNTER — Inpatient Hospital Stay: Payer: Medicare Other

## 2019-08-12 ENCOUNTER — Other Ambulatory Visit: Payer: Self-pay

## 2019-08-12 VITALS — BP 121/44 | HR 64 | Temp 99.1°F | Resp 16

## 2019-08-12 DIAGNOSIS — Z5111 Encounter for antineoplastic chemotherapy: Secondary | ICD-10-CM | POA: Diagnosis not present

## 2019-08-12 DIAGNOSIS — C670 Malignant neoplasm of trigone of bladder: Secondary | ICD-10-CM

## 2019-08-12 DIAGNOSIS — Z95828 Presence of other vascular implants and grafts: Secondary | ICD-10-CM

## 2019-08-12 MED ORDER — PEGFILGRASTIM-JMDB 6 MG/0.6ML ~~LOC~~ SOSY
6.0000 mg | PREFILLED_SYRINGE | Freq: Once | SUBCUTANEOUS | Status: AC
  Administered 2019-08-12: 6 mg via SUBCUTANEOUS

## 2019-08-12 NOTE — Patient Instructions (Signed)

## 2019-08-15 ENCOUNTER — Encounter: Payer: Self-pay | Admitting: Oncology

## 2019-08-22 ENCOUNTER — Other Ambulatory Visit: Payer: Self-pay | Admitting: Oncology

## 2019-08-23 ENCOUNTER — Inpatient Hospital Stay: Payer: Medicare Other

## 2019-08-23 ENCOUNTER — Other Ambulatory Visit: Payer: Self-pay

## 2019-08-23 ENCOUNTER — Inpatient Hospital Stay: Payer: Medicare Other | Attending: Oncology | Admitting: Oncology

## 2019-08-23 VITALS — BP 133/48 | HR 55 | Temp 98.7°F | Resp 18 | Ht 64.0 in | Wt 135.8 lb

## 2019-08-23 DIAGNOSIS — D701 Agranulocytosis secondary to cancer chemotherapy: Secondary | ICD-10-CM | POA: Insufficient documentation

## 2019-08-23 DIAGNOSIS — D6481 Anemia due to antineoplastic chemotherapy: Secondary | ICD-10-CM | POA: Diagnosis not present

## 2019-08-23 DIAGNOSIS — Z79899 Other long term (current) drug therapy: Secondary | ICD-10-CM | POA: Diagnosis not present

## 2019-08-23 DIAGNOSIS — C670 Malignant neoplasm of trigone of bladder: Secondary | ICD-10-CM

## 2019-08-23 DIAGNOSIS — R112 Nausea with vomiting, unspecified: Secondary | ICD-10-CM | POA: Diagnosis not present

## 2019-08-23 DIAGNOSIS — T451X5A Adverse effect of antineoplastic and immunosuppressive drugs, initial encounter: Secondary | ICD-10-CM | POA: Insufficient documentation

## 2019-08-23 DIAGNOSIS — Z5111 Encounter for antineoplastic chemotherapy: Secondary | ICD-10-CM | POA: Diagnosis present

## 2019-08-23 DIAGNOSIS — C679 Malignant neoplasm of bladder, unspecified: Secondary | ICD-10-CM | POA: Insufficient documentation

## 2019-08-23 DIAGNOSIS — Z7982 Long term (current) use of aspirin: Secondary | ICD-10-CM | POA: Insufficient documentation

## 2019-08-23 DIAGNOSIS — R634 Abnormal weight loss: Secondary | ICD-10-CM | POA: Diagnosis not present

## 2019-08-23 LAB — CBC WITH DIFFERENTIAL (CANCER CENTER ONLY)
Abs Immature Granulocytes: 1.41 10*3/uL — ABNORMAL HIGH (ref 0.00–0.07)
Basophils Absolute: 0.1 10*3/uL (ref 0.0–0.1)
Basophils Relative: 0 %
Eosinophils Absolute: 0.1 10*3/uL (ref 0.0–0.5)
Eosinophils Relative: 1 %
HCT: 29.9 % — ABNORMAL LOW (ref 39.0–52.0)
Hemoglobin: 9.6 g/dL — ABNORMAL LOW (ref 13.0–17.0)
Immature Granulocytes: 7 %
Lymphocytes Relative: 15 %
Lymphs Abs: 2.8 10*3/uL (ref 0.7–4.0)
MCH: 31.4 pg (ref 26.0–34.0)
MCHC: 32.1 g/dL (ref 30.0–36.0)
MCV: 97.7 fL (ref 80.0–100.0)
Monocytes Absolute: 1 10*3/uL (ref 0.1–1.0)
Monocytes Relative: 5 %
Neutro Abs: 13.8 10*3/uL — ABNORMAL HIGH (ref 1.7–7.7)
Neutrophils Relative %: 72 %
Platelet Count: 192 10*3/uL (ref 150–400)
RBC: 3.06 MIL/uL — ABNORMAL LOW (ref 4.22–5.81)
RDW: 16.6 % — ABNORMAL HIGH (ref 11.5–15.5)
WBC Count: 19.2 10*3/uL — ABNORMAL HIGH (ref 4.0–10.5)
nRBC: 0.2 % (ref 0.0–0.2)

## 2019-08-23 LAB — CMP (CANCER CENTER ONLY)
ALT: 9 U/L (ref 0–44)
AST: 13 U/L — ABNORMAL LOW (ref 15–41)
Albumin: 4.2 g/dL (ref 3.5–5.0)
Alkaline Phosphatase: 99 U/L (ref 38–126)
Anion gap: 8 (ref 5–15)
BUN: 25 mg/dL — ABNORMAL HIGH (ref 8–23)
CO2: 26 mmol/L (ref 22–32)
Calcium: 9 mg/dL (ref 8.9–10.3)
Chloride: 105 mmol/L (ref 98–111)
Creatinine: 1.07 mg/dL (ref 0.61–1.24)
GFR, Est AFR Am: >60 mL/min (ref >60)
GFR, Estimated: >60 mL/min (ref >60)
Glucose, Bld: 96 mg/dL (ref 70–99)
Potassium: 5.4 mmol/L — ABNORMAL HIGH (ref 3.5–5.1)
Sodium: 139 mmol/L (ref 135–145)
Total Bilirubin: 0.3 mg/dL (ref 0.3–1.2)
Total Protein: 7 g/dL (ref 6.5–8.1)

## 2019-08-23 NOTE — Progress Notes (Signed)
Hematology and Oncology Follow Up Visit  Robert Lowery [id number] [birth year] 63 y.o. 08/23/2019 10:39 AM [name], MDMoreira, [name], MD   Principle Diagnosis: 63 year old with bladder cancer diagnosed in 2019 with localized disease.  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   Prior Therapy:   He is status post cystectomy done in July 2019.  Surgery performed in [place]. He is status post lung biopsy completed on June 2 of 2020.   Current therapy: Gemcitabine and cisplatin chemotherapy cycle 1 started on 06/02/2019.  He is here for evaluation prior to cycle 5 of therapy.    Interim History: Robert Lowery returns today for a follow-up.  Since the last visit, he continues to tolerate chemotherapy without any major complications.  He denies any nausea vomiting but does report weight loss.  He reports appetite changes and change in taste of food which has affected his appetite temporarily for a few days after chemotherapy.  He denies any worsening neuropathy or excessive fatigue.  He denies any recent hospitalizations or illnesses.  He denied any alteration mental status, neuropathy, confusion or dizziness.  Denies any headaches or lethargy.  Denies any night sweats, weight loss or changes in appetite.  Denied orthopnea, dyspnea on exertion or chest discomfort.  Denies shortness of breath, difficulty breathing hemoptysis or cough.  Denies any abdominal distention, nausea, early satiety or dyspepsia.  Denies any hematuria, frequency, dysuria or nocturia.  Denies any skin irritation, dryness or rash.  Denies any ecchymosis or petechiae.  Denies any lymphadenopathy or clotting.  Denies any heat or cold intolerance.  Denies any anxiety or depression.  Remaining review of system is negative.          Medications: Reviewed without changes. Current Outpatient Medications  Medication Sig Dispense Refill  . aspirin 81 MG chewable tablet Chew 1 tablet (81 mg  total) by mouth daily.    Marland Kitchen lidocaine-prilocaine (EMLA) cream APPLY ONE APPLICATION TOPICALLY AS NEEDED 30 g 0  . methocarbamol (ROBAXIN) 500 MG tablet Take 1 tablet (500 mg total) by mouth 4 (four) times daily. 40 tablet 0  . prochlorperazine (COMPAZINE) 10 MG tablet TAKE 1 TABLET BY MOUTH EVERY 6 HOURS AS NEEDED FOR NAUSEA OR VOMITING  30 tablet 0   No current facility-administered medications for this visit.      Allergies: No Known Allergies  Past Medical History, Surgical history, Social history, and Family History reviewed without changes.   Physical Exam:    Blood pressure (!) 133/48, pulse (!) 55, temperature 98.7 F (37.1 C), temperature source Temporal, resp. rate 18, height 5\' 4"  (1.626 m), weight 135 lb 12.8 oz (61.6 kg), SpO2 100 %.    ECOG: 0    General appearance: Alert, awake without any distress. Head: Atraumatic without abnormalities Oropharynx: Without any thrush or ulcers. Eyes: No scleral icterus. Lymph nodes: No lymphadenopathy noted in the cervical, supraclavicular, or axillary nodes Heart:regular rate and rhythm, without any murmurs or gallops.   Lung: Clear to auscultation without any rhonchi, wheezes or dullness to percussion. Abdomin: Soft, nontender without any shifting dullness or ascites. Musculoskeletal: No clubbing or cyanosis. Neurological: No motor or sensory deficits. Skin: No rashes or lesions.       Lab Results: Lab Results  Component Value Date   WBC 19.2 (H) 08/23/2019   HGB 9.6 (L) 08/23/2019   HCT 29.9 (L) 08/23/2019   MCV 97.7 08/23/2019   PLT 192 08/23/2019     Chemistry  Component Value Date/Time   NA 139 08/23/2019 0946   K 5.4 (H) 08/23/2019 0946   CL 105 08/23/2019 0946   CO2 26 08/23/2019 0946   BUN 25 (H) 08/23/2019 0946   CREATININE 1.07 08/23/2019 0946      Component Value Date/Time   CALCIUM 9.0 08/23/2019 0946   ALKPHOS 99 08/23/2019 0946   AST 13 (L) 08/23/2019 0946   ALT 9 08/23/2019 0946    BILITOT 0.3 08/23/2019 0946        Impression and Plan:   63 year old man with  1.  Stage IV bladder cancer diagnosed documented in June 2020.  He is currently receiving systemic chemotherapy utilizing gemcitabine and cisplatin with excellent response after 3 cycles of therapy.  Risks and benefits of continuing with treatment was reviewed today.  Long-term complications were reiterated including renal dysfunction, weight loss among others.  The plan is to complete 6 cycles of therapy and repeat imaging studies.  Subsequent systemic therapy versus local therapy for his residual disease were also reviewed.  Agreeable with this plan at this time.   2. IV access:Port-A-Cath remains in place without any issues.  3. Antiemetics: No nausea or vomiting reported.  4. Renal function surveillance: Creatinine clearance remains normal and will continue to be monitored on cisplatin.  5. Goals of care:  Therapy remains potentially curative at this time and aggressive measures are warranted.  6.  Neutropenia: He will continue to receive growth factor support after each cycle of therapy.  7.  Weight loss: We discussed strategies to boost his nutritional intake and maintain his weight including using nutritional supplements and focusing on high caloric diets.  8. Follow-up: On 08/24/2019 for the start of cycle 5 and in 3 weeks for evaluation prior to cycle 6.   25  minutes was spent with the patient face-to-face today.  More than 50% of time was spent on reviewing his disease status, treatment options, complications of therapy and answering questions regarding plan of care.      Zola Button, MD 9/9/202010:39 AM

## 2019-08-24 ENCOUNTER — Inpatient Hospital Stay: Payer: Medicare Other

## 2019-08-24 ENCOUNTER — Other Ambulatory Visit: Payer: Self-pay

## 2019-08-24 VITALS — BP 119/62 | HR 51 | Temp 98.3°F | Resp 18

## 2019-08-24 DIAGNOSIS — C679 Malignant neoplasm of bladder, unspecified: Secondary | ICD-10-CM

## 2019-08-24 DIAGNOSIS — Z5111 Encounter for antineoplastic chemotherapy: Secondary | ICD-10-CM | POA: Diagnosis not present

## 2019-08-24 MED ORDER — HEPARIN SOD (PORK) LOCK FLUSH 100 UNIT/ML IV SOLN
500.0000 [IU] | Freq: Once | INTRAVENOUS | Status: AC | PRN
  Administered 2019-08-24: 500 [IU]
  Filled 2019-08-24: qty 5

## 2019-08-24 MED ORDER — SODIUM CHLORIDE 0.9 % IV SOLN
Freq: Once | INTRAVENOUS | Status: AC
  Administered 2019-08-24: 13:00:00 via INTRAVENOUS
  Filled 2019-08-24: qty 5

## 2019-08-24 MED ORDER — PALONOSETRON HCL INJECTION 0.25 MG/5ML
0.2500 mg | Freq: Once | INTRAVENOUS | Status: AC
  Administered 2019-08-24: 0.25 mg via INTRAVENOUS

## 2019-08-24 MED ORDER — SODIUM CHLORIDE 0.9 % IV SOLN
1000.0000 mg/m2 | Freq: Once | INTRAVENOUS | Status: AC
  Administered 2019-08-24: 1710 mg via INTRAVENOUS
  Filled 2019-08-24: qty 44.97

## 2019-08-24 MED ORDER — SODIUM CHLORIDE 0.9 % IV SOLN
70.0000 mg/m2 | Freq: Once | INTRAVENOUS | Status: AC
  Administered 2019-08-24: 15:00:00 119 mg via INTRAVENOUS
  Filled 2019-08-24: qty 119

## 2019-08-24 MED ORDER — MANNITOL 25 % IV SOLN
Freq: Once | INTRAVENOUS | Status: AC
  Administered 2019-08-24: 11:00:00 via INTRAVENOUS
  Filled 2019-08-24: qty 2.96

## 2019-08-24 MED ORDER — PALONOSETRON HCL INJECTION 0.25 MG/5ML
INTRAVENOUS | Status: AC
  Filled 2019-08-24: qty 5

## 2019-08-24 MED ORDER — SODIUM CHLORIDE 0.9% FLUSH
10.0000 mL | INTRAVENOUS | Status: DC | PRN
  Administered 2019-08-24: 10 mL
  Filled 2019-08-24: qty 10

## 2019-08-24 MED ORDER — SODIUM CHLORIDE 0.9 % IV SOLN
Freq: Once | INTRAVENOUS | Status: AC
  Administered 2019-08-24: 10:00:00 via INTRAVENOUS
  Filled 2019-08-24: qty 250

## 2019-08-24 NOTE — Patient Instructions (Signed)
[facility] Discharge Instructions for Patients Receiving Chemotherapy  Today you received the following chemotherapy agents Gemzar and Cisplatin  To help prevent nausea and vomiting after your treatment, we encourage you to take your nausea medication as directed   If you develop nausea and vomiting that is not controlled by your nausea medication, call the clinic.   BELOW ARE SYMPTOMS THAT SHOULD BE REPORTED IMMEDIATELY:  *FEVER GREATER THAN 100.5 F  *CHILLS WITH OR WITHOUT FEVER  NAUSEA AND VOMITING THAT IS NOT CONTROLLED WITH YOUR NAUSEA MEDICATION  *UNUSUAL SHORTNESS OF BREATH  *UNUSUAL BRUISING OR BLEEDING  TENDERNESS IN MOUTH AND THROAT WITH OR WITHOUT PRESENCE OF ULCERS  *URINARY PROBLEMS  *BOWEL PROBLEMS  UNUSUAL RASH Items with * indicate a potential emergency and should be followed up as soon as possible.  Feel free to call the clinic should you have any questions or concerns. The clinic phone number is [phone].  Please show the CHEMO ALERT CARD at check-in to the Emergency Department and triage nurse.   

## 2019-08-31 ENCOUNTER — Other Ambulatory Visit: Payer: Self-pay

## 2019-08-31 ENCOUNTER — Inpatient Hospital Stay: Payer: Medicare Other

## 2019-08-31 VITALS — BP 135/50 | HR 56 | Temp 98.2°F | Resp 20

## 2019-08-31 DIAGNOSIS — C679 Malignant neoplasm of bladder, unspecified: Secondary | ICD-10-CM

## 2019-08-31 DIAGNOSIS — Z95828 Presence of other vascular implants and grafts: Secondary | ICD-10-CM

## 2019-08-31 DIAGNOSIS — C670 Malignant neoplasm of trigone of bladder: Secondary | ICD-10-CM

## 2019-08-31 DIAGNOSIS — Z5111 Encounter for antineoplastic chemotherapy: Secondary | ICD-10-CM | POA: Diagnosis not present

## 2019-08-31 LAB — CBC WITH DIFFERENTIAL (CANCER CENTER ONLY)
Abs Immature Granulocytes: 0 10*3/uL (ref 0.00–0.07)
Basophils Absolute: 0 10*3/uL (ref 0.0–0.1)
Basophils Relative: 0 %
Eosinophils Absolute: 0 10*3/uL (ref 0.0–0.5)
Eosinophils Relative: 1 %
HCT: 22.4 % — ABNORMAL LOW (ref 39.0–52.0)
Hemoglobin: 7.5 g/dL — ABNORMAL LOW (ref 13.0–17.0)
Immature Granulocytes: 0 %
Lymphocytes Relative: 38 %
Lymphs Abs: 1 10*3/uL (ref 0.7–4.0)
MCH: 31.6 pg (ref 26.0–34.0)
MCHC: 33.5 g/dL (ref 30.0–36.0)
MCV: 94.5 fL (ref 80.0–100.0)
Monocytes Absolute: 0.2 10*3/uL (ref 0.1–1.0)
Monocytes Relative: 8 %
Neutro Abs: 1.4 10*3/uL — ABNORMAL LOW (ref 1.7–7.7)
Neutrophils Relative %: 53 %
Platelet Count: 173 10*3/uL (ref 150–400)
RBC: 2.37 MIL/uL — ABNORMAL LOW (ref 4.22–5.81)
RDW: 15 % (ref 11.5–15.5)
WBC Count: 2.7 10*3/uL — ABNORMAL LOW (ref 4.0–10.5)
nRBC: 0 % (ref 0.0–0.2)

## 2019-08-31 LAB — CMP (CANCER CENTER ONLY)
ALT: 8 U/L (ref 0–44)
AST: 12 U/L — ABNORMAL LOW (ref 15–41)
Albumin: 3.9 g/dL (ref 3.5–5.0)
Alkaline Phosphatase: 55 U/L (ref 38–126)
Anion gap: 8 (ref 5–15)
BUN: 21 mg/dL (ref 8–23)
CO2: 26 mmol/L (ref 22–32)
Calcium: 8.8 mg/dL — ABNORMAL LOW (ref 8.9–10.3)
Chloride: 101 mmol/L (ref 98–111)
Creatinine: 0.96 mg/dL (ref 0.61–1.24)
GFR, Est AFR Am: >60 mL/min (ref >60)
GFR, Estimated: >60 mL/min (ref >60)
Glucose, Bld: 109 mg/dL — ABNORMAL HIGH (ref 70–99)
Potassium: 4.7 mmol/L (ref 3.5–5.1)
Sodium: 135 mmol/L (ref 135–145)
Total Bilirubin: 0.4 mg/dL (ref 0.3–1.2)
Total Protein: 6.3 g/dL — ABNORMAL LOW (ref 6.5–8.1)

## 2019-08-31 MED ORDER — SODIUM CHLORIDE 0.9% FLUSH
10.0000 mL | INTRAVENOUS | Status: DC | PRN
  Administered 2019-08-31: 10 mL
  Filled 2019-08-31: qty 10

## 2019-08-31 MED ORDER — HEPARIN SOD (PORK) LOCK FLUSH 100 UNIT/ML IV SOLN
500.0000 [IU] | Freq: Once | INTRAVENOUS | Status: AC | PRN
  Administered 2019-08-31: 500 [IU]
  Filled 2019-08-31: qty 5

## 2019-08-31 MED ORDER — PROCHLORPERAZINE MALEATE 10 MG PO TABS
10.0000 mg | ORAL_TABLET | Freq: Once | ORAL | Status: AC
  Administered 2019-08-31: 10 mg via ORAL

## 2019-08-31 MED ORDER — SODIUM CHLORIDE 0.9 % IV SOLN
Freq: Once | INTRAVENOUS | Status: AC
  Administered 2019-08-31: 09:00:00 via INTRAVENOUS
  Filled 2019-08-31: qty 250

## 2019-08-31 MED ORDER — SODIUM CHLORIDE 0.9 % IV SOLN: 1000.0000 mg/m2 | Freq: Once | INTRAVENOUS | Status: DC

## 2019-08-31 MED ORDER — SODIUM CHLORIDE 0.9 % IV SOLN
1000.0000 mg/m2 | Freq: Once | INTRAVENOUS | Status: AC
  Administered 2019-08-31: 11:00:00 1710 mg via INTRAVENOUS
  Filled 2019-08-31: qty 44.97

## 2019-08-31 MED ORDER — PROCHLORPERAZINE MALEATE 10 MG PO TABS
ORAL_TABLET | ORAL | Status: AC
  Filled 2019-08-31: qty 1

## 2019-08-31 MED ORDER — SODIUM CHLORIDE 0.9% FLUSH
10.0000 mL | Freq: Once | INTRAVENOUS | Status: AC
  Administered 2019-08-31: 09:00:00 10 mL
  Filled 2019-08-31: qty 10

## 2019-08-31 NOTE — Patient Instructions (Signed)
[facility] °Discharge Instructions for Patients Receiving Chemotherapy ° °Today you received the following chemotherapy agents Gemzar ° °To help prevent nausea and vomiting after your treatment, we encourage you to take your nausea medication as directed. °  °If you develop nausea and vomiting that is not controlled by your nausea medication, call the clinic.  ° °BELOW ARE SYMPTOMS THAT SHOULD BE REPORTED IMMEDIATELY: °· *FEVER GREATER THAN 100.5 F °· *CHILLS WITH OR WITHOUT FEVER °· NAUSEA AND VOMITING THAT IS NOT CONTROLLED WITH YOUR NAUSEA MEDICATION °· *UNUSUAL SHORTNESS OF BREATH °· *UNUSUAL BRUISING OR BLEEDING °· TENDERNESS IN MOUTH AND THROAT WITH OR WITHOUT PRESENCE OF ULCERS °· *URINARY PROBLEMS °· *BOWEL PROBLEMS °· UNUSUAL RASH °Items with * indicate a potential emergency and should be followed up as soon as possible. ° °Feel free to call the clinic should you have any questions or concerns. The clinic phone number is [phone]. ° °Please show the CHEMO ALERT CARD at check-in to the Emergency Department and triage nurse. ° ° °

## 2019-08-31 NOTE — Progress Notes (Signed)
ANC and Hg results reported to Dr. Alen Blew. Received OK to treat.

## 2019-09-01 ENCOUNTER — Other Ambulatory Visit: Payer: Self-pay

## 2019-09-01 ENCOUNTER — Inpatient Hospital Stay: Payer: Medicare Other

## 2019-09-01 VITALS — BP 129/68 | HR 58 | Temp 98.3°F | Resp 18

## 2019-09-01 DIAGNOSIS — Z5111 Encounter for antineoplastic chemotherapy: Secondary | ICD-10-CM | POA: Diagnosis not present

## 2019-09-01 DIAGNOSIS — Z95828 Presence of other vascular implants and grafts: Secondary | ICD-10-CM

## 2019-09-01 DIAGNOSIS — C670 Malignant neoplasm of trigone of bladder: Secondary | ICD-10-CM

## 2019-09-01 MED ORDER — PEGFILGRASTIM-JMDB 6 MG/0.6ML ~~LOC~~ SOSY
PREFILLED_SYRINGE | SUBCUTANEOUS | Status: AC
  Filled 2019-09-01: qty 0.6

## 2019-09-01 MED ORDER — PEGFILGRASTIM-JMDB 6 MG/0.6ML ~~LOC~~ SOSY
6.0000 mg | PREFILLED_SYRINGE | Freq: Once | SUBCUTANEOUS | Status: AC
  Administered 2019-09-01: 6 mg via SUBCUTANEOUS

## 2019-09-01 NOTE — Patient Instructions (Signed)

## 2019-09-04 ENCOUNTER — Other Ambulatory Visit: Payer: Self-pay | Admitting: Oncology

## 2019-09-06 ENCOUNTER — Encounter: Payer: Self-pay | Admitting: Oncology

## 2019-09-07 ENCOUNTER — Other Ambulatory Visit: Payer: Self-pay

## 2019-09-07 ENCOUNTER — Encounter: Payer: Self-pay | Admitting: Oncology

## 2019-09-07 MED ORDER — PROMETHAZINE HCL 25 MG PO TABS: 25.0000 mg | ORAL_TABLET | Freq: Four times a day (QID) | ORAL | 0 refills | Status: DC | PRN

## 2019-09-13 ENCOUNTER — Inpatient Hospital Stay (HOSPITAL_BASED_OUTPATIENT_CLINIC_OR_DEPARTMENT_OTHER): Payer: Medicare Other | Admitting: Oncology

## 2019-09-13 ENCOUNTER — Inpatient Hospital Stay: Payer: Medicare Other

## 2019-09-13 ENCOUNTER — Other Ambulatory Visit: Payer: Self-pay

## 2019-09-13 VITALS — BP 115/58 | HR 62 | Temp 98.9°F | Resp 17 | Ht 64.0 in | Wt 138.1 lb

## 2019-09-13 DIAGNOSIS — D649 Anemia, unspecified: Secondary | ICD-10-CM | POA: Diagnosis not present

## 2019-09-13 DIAGNOSIS — Z5111 Encounter for antineoplastic chemotherapy: Secondary | ICD-10-CM | POA: Diagnosis not present

## 2019-09-13 DIAGNOSIS — C670 Malignant neoplasm of trigone of bladder: Secondary | ICD-10-CM

## 2019-09-13 DIAGNOSIS — C679 Malignant neoplasm of bladder, unspecified: Secondary | ICD-10-CM

## 2019-09-13 LAB — CBC WITH DIFFERENTIAL (CANCER CENTER ONLY)
Abs Immature Granulocytes: 0.19 10*3/uL — ABNORMAL HIGH (ref 0.00–0.07)
Basophils Absolute: 0 10*3/uL (ref 0.0–0.1)
Basophils Relative: 0 %
Eosinophils Absolute: 0.1 10*3/uL (ref 0.0–0.5)
Eosinophils Relative: 2 %
HCT: 21.9 % — ABNORMAL LOW (ref 39.0–52.0)
Hemoglobin: 6.9 g/dL — CL (ref 13.0–17.0)
Immature Granulocytes: 3 %
Lymphocytes Relative: 28 %
Lymphs Abs: 1.8 10*3/uL (ref 0.7–4.0)
MCH: 32.9 pg (ref 26.0–34.0)
MCHC: 31.5 g/dL (ref 30.0–36.0)
MCV: 104.3 fL — ABNORMAL HIGH (ref 80.0–100.0)
Monocytes Absolute: 0.6 10*3/uL (ref 0.1–1.0)
Monocytes Relative: 10 %
Neutro Abs: 3.7 10*3/uL (ref 1.7–7.7)
Neutrophils Relative %: 57 %
Platelet Count: 140 10*3/uL — ABNORMAL LOW (ref 150–400)
RBC: 2.1 MIL/uL — ABNORMAL LOW (ref 4.22–5.81)
RDW: 17.3 % — ABNORMAL HIGH (ref 11.5–15.5)
WBC Count: 6.4 10*3/uL (ref 4.0–10.5)
nRBC: 0 % (ref 0.0–0.2)

## 2019-09-13 LAB — CMP (CANCER CENTER ONLY)
ALT: 7 U/L (ref 0–44)
AST: 10 U/L — ABNORMAL LOW (ref 15–41)
Albumin: 3.9 g/dL (ref 3.5–5.0)
Alkaline Phosphatase: 71 U/L (ref 38–126)
Anion gap: 8 (ref 5–15)
BUN: 19 mg/dL (ref 8–23)
CO2: 26 mmol/L (ref 22–32)
Calcium: 8.2 mg/dL — ABNORMAL LOW (ref 8.9–10.3)
Chloride: 107 mmol/L (ref 98–111)
Creatinine: 1.13 mg/dL (ref 0.61–1.24)
GFR, Est AFR Am: >60 mL/min (ref >60)
GFR, Estimated: >60 mL/min (ref >60)
Glucose, Bld: 139 mg/dL — ABNORMAL HIGH (ref 70–99)
Potassium: 5.2 mmol/L — ABNORMAL HIGH (ref 3.5–5.1)
Sodium: 141 mmol/L (ref 135–145)
Total Bilirubin: <0.2 mg/dL — ABNORMAL LOW (ref 0.3–1.2)
Total Protein: 6.2 g/dL — ABNORMAL LOW (ref 6.5–8.1)

## 2019-09-13 NOTE — Progress Notes (Signed)
Hematology and Oncology Follow Up Visit  Yoan Sallade [id number] Sep 20, 1956 63 y.o. 09/13/2019 3:01 PM [name], MDMoreira, [name], MD   Principle Diagnosis: 63 year old with stage IV bladder cancer with pulmonary metastasis noted in June 2020.  He was initially diagnosed in 2019 with localized disease.     Prior Therapy:   He is status post cystectomy done in July 2019.  Surgery performed in [place]. He is status post lung biopsy completed on June 2 of 2020.   Current therapy: Gemcitabine and cisplatin chemotherapy cycle 1 started on 06/02/2019.  He is status post 5 cycles of therapy with cycle 6 scheduled on September 14, 2019.    Interim History: Mr. Quam is here for a follow-up evaluation.  Since the last visit, he reports no major changes in his health.  He tolerated the chemotherapy treatment with the last cycle without any issues.  He did have 2 episodes of nausea and vomited but issues resolved rather quickly without intervention.  He denies any weight loss or appetite changes.  His performance status and quality of life remained excellent.  Patient denied headaches, blurry vision, syncope or seizures.  Denies any fevers, chills or sweats.  Denied chest pain, palpitation, orthopnea or leg edema.  Denied cough, wheezing or hemoptysis.  Denied nausea, vomiting or abdominal pain.  Denies any constipation or diarrhea.  Denies any frequency urgency or hesitancy.  Denies any arthralgias or myalgias.  Denies any skin rashes or lesions.  Denies any bleeding or clotting tendency.  Denies any easy bruising.  Denies any hair or nail changes.  Denies any anxiety or depression.  Remaining review of system is negative.           Medications: Updated without any changes. Current Outpatient Medications  Medication Sig Dispense Refill  . aspirin 81 MG chewable tablet Chew 1 tablet (81 mg total) by mouth daily.    Marland Kitchen lidocaine-prilocaine (EMLA) cream APPLY ONE APPLICATION TOPICALLY AS NEEDED 30  g 0  . methocarbamol (ROBAXIN) 500 MG tablet Take 1 tablet (500 mg total) by mouth 4 (four) times daily. 40 tablet 0  . prochlorperazine (COMPAZINE) 10 MG tablet TAKE ONE TABLET BY MOUTH EVERY SIX HOURS AS NEEDED FOR NAUSEA OR VOMITING 30 tablet 0  . promethazine (PHENERGAN) 25 MG tablet Take 1 tablet (25 mg total) by mouth every 6 (six) hours as needed for nausea or vomiting. 50 tablet 0   No current facility-administered medications for this visit.      Allergies: No Known Allergies  Past Medical History, Surgical history, Social history, and Family History remains without change on review.   Physical Exam:    Blood pressure (!) 115/58, pulse 62, temperature 98.9 F (37.2 C), temperature source Oral, resp. rate 17, height 5\' 4"  (1.626 m), weight 138 lb 1.6 oz (62.6 kg), SpO2 100 %.    ECOG: 0    General appearance: Comfortable appearing without any discomfort Head: Normocephalic without any trauma Oropharynx: Mucous membranes are moist and pink without any thrush or ulcers. Eyes: Pupils are equal and round reactive to light. Lymph nodes: No cervical, supraclavicular, inguinal or axillary lymphadenopathy.   Heart:regular rate and rhythm.  S1 and S2 without leg edema. Lung: Clear without any rhonchi or wheezes.  No dullness to percussion. Abdomin: Soft, nontender, nondistended with good bowel sounds.  No hepatosplenomegaly. Musculoskeletal: No joint deformity or effusion.  Full range of motion noted. Neurological: No deficits noted on motor, sensory and deep tendon reflex exam. Skin: No  petechial rash or dryness.  Appeared moist.  Psychiatric: Mood and affect appeared appropriate.        Lab Results: Lab Results  Component Value Date   WBC 6.4 09/13/2019   HGB 6.9 (LL) 09/13/2019   HCT 21.9 (L) 09/13/2019   MCV 104.3 (H) 09/13/2019   PLT 140 (L) 09/13/2019     Chemistry      Component Value Date/Time   NA 141 09/13/2019 1417   K 5.2 (H) 09/13/2019 1417   CL  107 09/13/2019 1417   CO2 26 09/13/2019 1417   BUN 19 09/13/2019 1417   CREATININE 1.13 09/13/2019 1417      Component Value Date/Time   CALCIUM 8.2 (L) 09/13/2019 1417   ALKPHOS 71 09/13/2019 1417   AST 10 (L) 09/13/2019 1417   ALT 7 09/13/2019 1417   BILITOT <0.2 (L) 09/13/2019 1417        Impression and Plan:   63 year old man with  1.  Bladder cancer diagnosed in 2019 and subsequently developed stage IV disease with pulmonary involvement and 2020.  He is status post 5 cycles of chemotherapy utilizing cisplatin and gemcitabine.  He has tolerated therapy reasonably well with few complications.  Risks and benefits of proceeding with cycle 6 of therapy was discussed.  He is agreeable to proceed at this time and the plan is to obtain a PET CT scan upon completing cycle 6.  Depending on the results intervention would include maintenance immunotherapy, surgical resection or possible observation.   2. IV access:Port-A-Cath currently in use without any issues or complications.  3. Antiemetics: Current antiemetics are effective in preventing any nausea or vomiting at this time.  4. Renal function surveillance: His creatinine clearance remains close to baseline on cisplatin therapy.  5. Goals of care: 's disease is still potentially curable although it is possible that if he develops other metastatic disease that becomes incurable.  6.  Neutropenia: Related to chemotherapy and will require growth factor support after each cycle of therapy.  7.  Anemia: Related to chemotherapy.  He is asymptomatic at this time and the plan is to proceed with chemotherapy without any dose reduction or delay.  We have discussed the role of packed red cell transfusion if needed if his hemoglobin drops further.  8.  Weight loss: His weight is stabilized at this time.  9. Follow-up: He will start cycle 6 on October 1 and receive day 8 of the same cycle on October 8.  He will have MD evaluation  in 3 to 4 weeks after PET scan.   25  minutes was spent with the patient face-to-face today.  More than 50% of time was dedicated to reviewing his disease status, complications related therapy and addressing future plan of care.      Zola Button, MD 9/30/20203:01 PM

## 2019-09-13 NOTE — Progress Notes (Signed)
Received a call from the lab with hemoglobin result of 6.9. Dr. Alen Blew made aware and no further instructions received at this time.

## 2019-09-14 ENCOUNTER — Inpatient Hospital Stay: Payer: Medicare Other | Attending: Oncology

## 2019-09-14 ENCOUNTER — Other Ambulatory Visit: Payer: Self-pay

## 2019-09-14 VITALS — BP 137/47 | HR 50 | Temp 98.3°F | Resp 16

## 2019-09-14 DIAGNOSIS — C670 Malignant neoplasm of trigone of bladder: Secondary | ICD-10-CM | POA: Diagnosis not present

## 2019-09-14 DIAGNOSIS — Z5111 Encounter for antineoplastic chemotherapy: Secondary | ICD-10-CM | POA: Diagnosis not present

## 2019-09-14 DIAGNOSIS — D630 Anemia in neoplastic disease: Secondary | ICD-10-CM | POA: Diagnosis not present

## 2019-09-14 DIAGNOSIS — C679 Malignant neoplasm of bladder, unspecified: Secondary | ICD-10-CM

## 2019-09-14 DIAGNOSIS — Z7689 Persons encountering health services in other specified circumstances: Secondary | ICD-10-CM | POA: Insufficient documentation

## 2019-09-14 DIAGNOSIS — R634 Abnormal weight loss: Secondary | ICD-10-CM | POA: Diagnosis not present

## 2019-09-14 DIAGNOSIS — T451X5A Adverse effect of antineoplastic and immunosuppressive drugs, initial encounter: Secondary | ICD-10-CM | POA: Diagnosis not present

## 2019-09-14 DIAGNOSIS — Z7982 Long term (current) use of aspirin: Secondary | ICD-10-CM | POA: Diagnosis not present

## 2019-09-14 DIAGNOSIS — Z79899 Other long term (current) drug therapy: Secondary | ICD-10-CM | POA: Insufficient documentation

## 2019-09-14 DIAGNOSIS — C7800 Secondary malignant neoplasm of unspecified lung: Secondary | ICD-10-CM | POA: Diagnosis not present

## 2019-09-14 DIAGNOSIS — D6481 Anemia due to antineoplastic chemotherapy: Secondary | ICD-10-CM | POA: Diagnosis not present

## 2019-09-14 DIAGNOSIS — R110 Nausea: Secondary | ICD-10-CM

## 2019-09-14 MED ORDER — PROCHLORPERAZINE MALEATE 10 MG PO TABS
ORAL_TABLET | ORAL | Status: AC
  Filled 2019-09-14: qty 1

## 2019-09-14 MED ORDER — SODIUM CHLORIDE 0.9 % IV SOLN
1000.0000 mg/m2 | Freq: Once | INTRAVENOUS | Status: AC
  Administered 2019-09-14: 1710 mg via INTRAVENOUS
  Filled 2019-09-14: qty 44.97

## 2019-09-14 MED ORDER — PALONOSETRON HCL INJECTION 0.25 MG/5ML
0.2500 mg | Freq: Once | INTRAVENOUS | Status: AC
  Administered 2019-09-14: 0.25 mg via INTRAVENOUS

## 2019-09-14 MED ORDER — MANNITOL 25 % IV SOLN
Freq: Once | INTRAVENOUS | Status: AC
  Administered 2019-09-14: 09:00:00 via INTRAVENOUS
  Filled 2019-09-14: qty 2.96

## 2019-09-14 MED ORDER — PALONOSETRON HCL INJECTION 0.25 MG/5ML
INTRAVENOUS | Status: AC
  Filled 2019-09-14: qty 5

## 2019-09-14 MED ORDER — SODIUM CHLORIDE 0.9 % IV SOLN
Freq: Once | INTRAVENOUS | Status: AC
  Administered 2019-09-14: 09:00:00 via INTRAVENOUS
  Filled 2019-09-14: qty 250

## 2019-09-14 MED ORDER — SODIUM CHLORIDE 0.9% FLUSH
10.0000 mL | INTRAVENOUS | Status: DC | PRN
  Administered 2019-09-14: 10 mL
  Filled 2019-09-14: qty 10

## 2019-09-14 MED ORDER — SODIUM CHLORIDE 0.9 % IV SOLN
70.0000 mg/m2 | Freq: Once | INTRAVENOUS | Status: AC
  Administered 2019-09-14: 119 mg via INTRAVENOUS
  Filled 2019-09-14: qty 119

## 2019-09-14 MED ORDER — PROCHLORPERAZINE MALEATE 10 MG PO TABS
10.0000 mg | ORAL_TABLET | Freq: Four times a day (QID) | ORAL | Status: DC | PRN
  Administered 2019-09-14: 10 mg via ORAL

## 2019-09-14 MED ORDER — HEPARIN SOD (PORK) LOCK FLUSH 100 UNIT/ML IV SOLN
500.0000 [IU] | Freq: Once | INTRAVENOUS | Status: AC | PRN
  Administered 2019-09-14: 500 [IU]
  Filled 2019-09-14: qty 5

## 2019-09-14 MED ORDER — SODIUM CHLORIDE 0.9 % IV SOLN
Freq: Once | INTRAVENOUS | Status: AC
  Administered 2019-09-14: 11:00:00 via INTRAVENOUS
  Filled 2019-09-14: qty 5

## 2019-09-14 NOTE — Progress Notes (Signed)
Per Dr. Alen Blew note from 09/13/2019  "Anemia: Related to chemotherapy.  He is asymptomatic at this time and the plan is to proceed with chemotherapy without any dose reduction or delay.  We have discussed the role of packed red cell transfusion if needed if his hemoglobin drops further."   Pt complaints of nausea no vomiting.  Compazine ordered by Dr. Alen Blew

## 2019-09-14 NOTE — Progress Notes (Signed)
Remove potassium from cisplatin fluids today for potassium level of 5.2 yesterday. Orders updated.   Demetrius Charity, PharmD, [facility] Pharmacist Pharmacy Phone: [phone] 09/14/2019

## 2019-09-14 NOTE — Patient Instructions (Signed)
[facility] Discharge Instructions for Patients Receiving Chemotherapy  Today you received the following chemotherapy agents Gemzar and Cisplatin  To help prevent nausea and vomiting after your treatment, we encourage you to take your nausea medication as directed   If you develop nausea and vomiting that is not controlled by your nausea medication, call the clinic.   BELOW ARE SYMPTOMS THAT SHOULD BE REPORTED IMMEDIATELY:  *FEVER GREATER THAN 100.5 F  *CHILLS WITH OR WITHOUT FEVER  NAUSEA AND VOMITING THAT IS NOT CONTROLLED WITH YOUR NAUSEA MEDICATION  *UNUSUAL SHORTNESS OF BREATH  *UNUSUAL BRUISING OR BLEEDING  TENDERNESS IN MOUTH AND THROAT WITH OR WITHOUT PRESENCE OF ULCERS  *URINARY PROBLEMS  *BOWEL PROBLEMS  UNUSUAL RASH Items with * indicate a potential emergency and should be followed up as soon as possible.  Feel free to call the clinic should you have any questions or concerns. The clinic phone number is [phone].  Please show the CHEMO ALERT CARD at check-in to the Emergency Department and triage nurse.   

## 2019-09-15 ENCOUNTER — Telehealth: Payer: Self-pay | Admitting: Oncology

## 2019-09-15 NOTE — Telephone Encounter (Signed)
Called with interpreter,confirmed appts.

## 2019-09-21 ENCOUNTER — Other Ambulatory Visit: Payer: Self-pay

## 2019-09-21 ENCOUNTER — Inpatient Hospital Stay: Payer: Medicare Other

## 2019-09-21 DIAGNOSIS — D649 Anemia, unspecified: Secondary | ICD-10-CM

## 2019-09-21 DIAGNOSIS — Z5111 Encounter for antineoplastic chemotherapy: Secondary | ICD-10-CM | POA: Diagnosis not present

## 2019-09-21 DIAGNOSIS — R110 Nausea: Secondary | ICD-10-CM

## 2019-09-21 DIAGNOSIS — C679 Malignant neoplasm of bladder, unspecified: Secondary | ICD-10-CM

## 2019-09-21 LAB — CBC WITH DIFFERENTIAL (CANCER CENTER ONLY)
Abs Immature Granulocytes: 0 10*3/uL (ref 0.00–0.07)
Basophils Absolute: 0 10*3/uL (ref 0.0–0.1)
Basophils Relative: 1 %
Eosinophils Absolute: 0 10*3/uL (ref 0.0–0.5)
Eosinophils Relative: 1 %
HCT: 21.6 % — ABNORMAL LOW (ref 39.0–52.0)
Hemoglobin: 7.1 g/dL — ABNORMAL LOW (ref 13.0–17.0)
Immature Granulocytes: 0 %
Lymphocytes Relative: 59 %
Lymphs Abs: 1 10*3/uL (ref 0.7–4.0)
MCH: 33.5 pg (ref 26.0–34.0)
MCHC: 32.9 g/dL (ref 30.0–36.0)
MCV: 101.9 fL — ABNORMAL HIGH (ref 80.0–100.0)
Monocytes Absolute: 0.2 10*3/uL (ref 0.1–1.0)
Monocytes Relative: 9 %
Neutro Abs: 0.5 10*3/uL — ABNORMAL LOW (ref 1.7–7.7)
Neutrophils Relative %: 30 %
Platelet Count: 265 10*3/uL (ref 150–400)
RBC: 2.12 MIL/uL — ABNORMAL LOW (ref 4.22–5.81)
RDW: 14.6 % (ref 11.5–15.5)
WBC Count: 1.7 10*3/uL — ABNORMAL LOW (ref 4.0–10.5)
nRBC: 0 % (ref 0.0–0.2)

## 2019-09-21 LAB — SAMPLE TO BLOOD BANK

## 2019-09-21 LAB — MAGNESIUM: Magnesium: 1.8 mg/dL (ref 1.7–2.4)

## 2019-09-21 LAB — CMP (CANCER CENTER ONLY)
ALT: <6 U/L (ref 0–44)
AST: 11 U/L — ABNORMAL LOW (ref 15–41)
Albumin: 3.9 g/dL (ref 3.5–5.0)
Alkaline Phosphatase: 60 U/L (ref 38–126)
Anion gap: 6 (ref 5–15)
BUN: 23 mg/dL (ref 8–23)
CO2: 25 mmol/L (ref 22–32)
Calcium: 8.4 mg/dL — ABNORMAL LOW (ref 8.9–10.3)
Chloride: 103 mmol/L (ref 98–111)
Creatinine: 0.97 mg/dL (ref 0.61–1.24)
GFR, Est AFR Am: >60 mL/min (ref >60)
GFR, Estimated: >60 mL/min (ref >60)
Glucose, Bld: 95 mg/dL (ref 70–99)
Potassium: 5.3 mmol/L — ABNORMAL HIGH (ref 3.5–5.1)
Sodium: 134 mmol/L — ABNORMAL LOW (ref 135–145)
Total Bilirubin: 0.3 mg/dL (ref 0.3–1.2)
Total Protein: 6.4 g/dL — ABNORMAL LOW (ref 6.5–8.1)

## 2019-09-21 LAB — NO BLOOD PRODUCTS

## 2019-09-21 NOTE — Progress Notes (Signed)
Per Dr. Alen Blew- hold chemo based on CBC today. Patient will receive 1 unit PRBC.   Patient has declined blood transfusion today. He is concerned it is not going to be clean blood. Educated patient on blood transfusion and donation process. He continues to refuse stating unknown how clean the donor blood is going to be. Patient signed refusal form and this form given to Dr. Clydell Hakim Nurse.   Blood bank notified. Patient will return tomorrow to have CBC checked if necessary and get his neulasta injection. Patient discharged from infusion room vitals stable, patient denies any symptoms except increasing fatigue.

## 2019-09-22 ENCOUNTER — Inpatient Hospital Stay: Payer: Medicare Other

## 2019-09-22 ENCOUNTER — Other Ambulatory Visit: Payer: Self-pay

## 2019-09-22 VITALS — BP 123/41 | HR 58 | Temp 98.3°F | Resp 18

## 2019-09-22 DIAGNOSIS — Z95828 Presence of other vascular implants and grafts: Secondary | ICD-10-CM

## 2019-09-22 DIAGNOSIS — Z5111 Encounter for antineoplastic chemotherapy: Secondary | ICD-10-CM | POA: Diagnosis not present

## 2019-09-22 DIAGNOSIS — C670 Malignant neoplasm of trigone of bladder: Secondary | ICD-10-CM

## 2019-09-22 MED ORDER — PEGFILGRASTIM-JMDB 6 MG/0.6ML ~~LOC~~ SOSY
PREFILLED_SYRINGE | SUBCUTANEOUS | Status: AC
  Filled 2019-09-22: qty 0.6

## 2019-09-22 MED ORDER — PEGFILGRASTIM-JMDB 6 MG/0.6ML ~~LOC~~ SOSY
6.0000 mg | PREFILLED_SYRINGE | Freq: Once | SUBCUTANEOUS | Status: AC
  Administered 2019-09-22: 09:00:00 6 mg via SUBCUTANEOUS

## 2019-09-22 NOTE — Patient Instructions (Signed)

## 2019-10-03 ENCOUNTER — Encounter: Payer: Self-pay | Admitting: Oncology

## 2019-10-05 ENCOUNTER — Other Ambulatory Visit: Payer: Self-pay

## 2019-10-05 ENCOUNTER — Ambulatory Visit (HOSPITAL_COMMUNITY)
Admission: RE | Admit: 2019-10-05 | Discharge: 2019-10-05 | Disposition: A | Discharge status: Home / Self Care | Payer: Medicare Other | Source: Ambulatory Visit | Attending: Oncology | Admitting: Oncology

## 2019-10-05 DIAGNOSIS — C670 Malignant neoplasm of trigone of bladder: Secondary | ICD-10-CM | POA: Insufficient documentation

## 2019-10-05 DIAGNOSIS — R911 Solitary pulmonary nodule: Secondary | ICD-10-CM | POA: Insufficient documentation

## 2019-10-05 DIAGNOSIS — Z79899 Other long term (current) drug therapy: Secondary | ICD-10-CM | POA: Diagnosis not present

## 2019-10-05 DIAGNOSIS — Z7982 Long term (current) use of aspirin: Secondary | ICD-10-CM | POA: Diagnosis not present

## 2019-10-05 LAB — GLUCOSE, CAPILLARY: Glucose-Capillary: 105 mg/dL — ABNORMAL HIGH (ref 70–99)

## 2019-10-05 MED ORDER — FLUDEOXYGLUCOSE F - 18 (FDG) INJECTION
6.8900 | Freq: Once | INTRAVENOUS | Status: AC
  Administered 2019-10-05: 11:00:00 6.89 via INTRAVENOUS

## 2019-10-06 ENCOUNTER — Other Ambulatory Visit: Payer: Self-pay

## 2019-10-06 ENCOUNTER — Inpatient Hospital Stay: Payer: Medicare Other

## 2019-10-06 DIAGNOSIS — D649 Anemia, unspecified: Secondary | ICD-10-CM

## 2019-10-06 DIAGNOSIS — Z95828 Presence of other vascular implants and grafts: Secondary | ICD-10-CM

## 2019-10-06 DIAGNOSIS — R110 Nausea: Secondary | ICD-10-CM

## 2019-10-06 DIAGNOSIS — C679 Malignant neoplasm of bladder, unspecified: Secondary | ICD-10-CM

## 2019-10-06 DIAGNOSIS — Z5111 Encounter for antineoplastic chemotherapy: Secondary | ICD-10-CM | POA: Diagnosis not present

## 2019-10-06 DIAGNOSIS — C670 Malignant neoplasm of trigone of bladder: Secondary | ICD-10-CM

## 2019-10-06 LAB — CMP (CANCER CENTER ONLY)
ALT: 7 U/L (ref 0–44)
AST: 11 U/L — ABNORMAL LOW (ref 15–41)
Albumin: 3.8 g/dL (ref 3.5–5.0)
Alkaline Phosphatase: 78 U/L (ref 38–126)
Anion gap: 7 (ref 5–15)
BUN: 20 mg/dL (ref 8–23)
CO2: 24 mmol/L (ref 22–32)
Calcium: 8.7 mg/dL — ABNORMAL LOW (ref 8.9–10.3)
Chloride: 108 mmol/L (ref 98–111)
Creatinine: 0.98 mg/dL (ref 0.61–1.24)
GFR, Est AFR Am: >60 mL/min (ref >60)
GFR, Estimated: >60 mL/min (ref >60)
Glucose, Bld: 95 mg/dL (ref 70–99)
Potassium: 5.1 mmol/L (ref 3.5–5.1)
Sodium: 139 mmol/L (ref 135–145)
Total Bilirubin: <0.2 mg/dL — ABNORMAL LOW (ref 0.3–1.2)
Total Protein: 6.5 g/dL (ref 6.5–8.1)

## 2019-10-06 LAB — CBC WITH DIFFERENTIAL (CANCER CENTER ONLY)
Abs Immature Granulocytes: 0.12 10*3/uL — ABNORMAL HIGH (ref 0.00–0.07)
Basophils Absolute: 0 10*3/uL (ref 0.0–0.1)
Basophils Relative: 0 %
Eosinophils Absolute: 0.1 10*3/uL (ref 0.0–0.5)
Eosinophils Relative: 1 %
HCT: 25.5 % — ABNORMAL LOW (ref 39.0–52.0)
Hemoglobin: 8.2 g/dL — ABNORMAL LOW (ref 13.0–17.0)
Immature Granulocytes: 2 %
Lymphocytes Relative: 19 %
Lymphs Abs: 1.5 10*3/uL (ref 0.7–4.0)
MCH: 32.4 pg (ref 26.0–34.0)
MCHC: 32.2 g/dL (ref 30.0–36.0)
MCV: 100.8 fL — ABNORMAL HIGH (ref 80.0–100.0)
Monocytes Absolute: 0.5 10*3/uL (ref 0.1–1.0)
Monocytes Relative: 6 %
Neutro Abs: 5.9 10*3/uL (ref 1.7–7.7)
Neutrophils Relative %: 72 %
Platelet Count: 238 10*3/uL (ref 150–400)
RBC: 2.53 MIL/uL — ABNORMAL LOW (ref 4.22–5.81)
RDW: 14.5 % (ref 11.5–15.5)
WBC Count: 8.1 10*3/uL (ref 4.0–10.5)
nRBC: 0 % (ref 0.0–0.2)

## 2019-10-06 LAB — SAMPLE TO BLOOD BANK

## 2019-10-06 LAB — MAGNESIUM: Magnesium: 2 mg/dL (ref 1.7–2.4)

## 2019-10-06 MED ORDER — HEPARIN SOD (PORK) LOCK FLUSH 100 UNIT/ML IV SOLN
500.0000 [IU] | Freq: Once | INTRAVENOUS | Status: AC
  Administered 2019-10-06: 500 [IU]
  Filled 2019-10-06: qty 5

## 2019-10-06 MED ORDER — SODIUM CHLORIDE 0.9% FLUSH
10.0000 mL | Freq: Once | INTRAVENOUS | Status: AC
  Administered 2019-10-06: 10 mL
  Filled 2019-10-06: qty 10

## 2019-10-06 NOTE — Progress Notes (Signed)
Instructed patient not to take off blue blood bractlet just incase he needs blood. Patient verbalized understanding.

## 2019-10-09 ENCOUNTER — Other Ambulatory Visit: Payer: Self-pay

## 2019-10-09 ENCOUNTER — Telehealth: Payer: Self-pay | Admitting: Oncology

## 2019-10-09 ENCOUNTER — Inpatient Hospital Stay (HOSPITAL_BASED_OUTPATIENT_CLINIC_OR_DEPARTMENT_OTHER): Payer: Medicare Other | Admitting: Oncology

## 2019-10-09 VITALS — BP 128/57 | HR 59 | Temp 98.5°F | Resp 17 | Ht 64.0 in | Wt 133.1 lb

## 2019-10-09 DIAGNOSIS — C670 Malignant neoplasm of trigone of bladder: Secondary | ICD-10-CM | POA: Diagnosis not present

## 2019-10-09 DIAGNOSIS — Z5111 Encounter for antineoplastic chemotherapy: Secondary | ICD-10-CM | POA: Diagnosis not present

## 2019-10-09 NOTE — Progress Notes (Signed)
Hematology and Oncology Follow Up Visit  Robert Lowery [id number] 07/12/1956 63 y.o. 10/09/2019 9:06 AM Robert Lowery, MDMoreira, Robert Manner, MD   Principle Diagnosis: 63 year old with bladder cancer diagnosed in 2019.  He subsequently developed stage IV disease with pulmonary involvement that is biopsy-proven in 2020.  Prior Therapy:   He is status post cystectomy done in July 2019.  Surgery performed in [place]. He is status post lung biopsy completed on June 2 of 2020.   Current therapy: Gemcitabine and cisplatin chemotherapy cycle 1 started on 06/02/2019.  He completed 6 cycles of therapy on September 21, 2019.    Interim History: Robert Lowery is here for return evaluation.  Since the last visit, he reports no major changes in his health.  He tolerated chemotherapy without any residual complications.  He denies any nausea vomiting or worsening neuropathy.  His appetite and performance status remained excellent.  He denies any respiratory complaints at this time.  He denied any alteration mental status, neuropathy, confusion or dizziness.  Denies any headaches or lethargy.  Denies any night sweats, weight loss or changes in appetite.  Denied orthopnea, dyspnea on exertion or chest discomfort.  Denies shortness of breath, difficulty breathing hemoptysis or cough.  Denies any abdominal distention, nausea, early satiety or dyspepsia.  Denies any hematuria, frequency, dysuria or nocturia.  Denies any skin irritation, dryness or rash.  Denies any ecchymosis or petechiae.  Denies any lymphadenopathy or clotting.  Denies any heat or cold intolerance.  Denies any anxiety or depression.  Remaining review of system is negative.              Medications: Without any changes on review. Current Outpatient Medications  Medication Sig Dispense Refill  . aspirin 81 MG chewable tablet Chew 1 tablet (81 mg total) by mouth daily.    Marland Kitchen lidocaine-prilocaine (EMLA) cream APPLY ONE APPLICATION TOPICALLY AS NEEDED  30 g 0  . methocarbamol (ROBAXIN) 500 MG tablet Take 1 tablet (500 mg total) by mouth 4 (four) times daily. 40 tablet 0  . prochlorperazine (COMPAZINE) 10 MG tablet TAKE ONE TABLET BY MOUTH EVERY SIX HOURS AS NEEDED FOR NAUSEA OR VOMITING 30 tablet 0  . promethazine (PHENERGAN) 25 MG tablet Take 1 tablet (25 mg total) by mouth every 6 (six) hours as needed for nausea or vomiting. 50 tablet 0   No current facility-administered medications for this visit.      Allergies: No Known Allergies  Past Medical History, Surgical history, Social history, and Family History unchanged on review.   Physical Exam:     Blood pressure (!) 128/57, pulse (!) 59, temperature 98.5 F (36.9 C), temperature source Oral, resp. rate 17, height 5\' 4"  (1.626 m), weight 133 lb 1.6 oz (60.4 kg), SpO2 100 %.    ECOG: 0     General appearance: Alert, awake without any distress. Head: Atraumatic without abnormalities Oropharynx: Without any thrush or ulcers. Eyes: No scleral icterus. Lymph nodes: No lymphadenopathy noted in the cervical, supraclavicular, or axillary nodes Heart:regular rate and rhythm, without any murmurs or gallops.   Lung: Clear to auscultation without any rhonchi, wheezes or dullness to percussion. Abdomin: Soft, nontender without any shifting dullness or ascites. Musculoskeletal: No clubbing or cyanosis. Neurological: No motor or sensory deficits. Skin: No rashes or lesions. Psychiatric: Mood and affect appeared normal.        Lab Results: Lab Results  Component Value Date   WBC 8.1 10/06/2019   HGB 8.2 (L) 10/06/2019   HCT  25.5 (L) 10/06/2019   MCV 100.8 (H) 10/06/2019   PLT 238 10/06/2019     Chemistry      Component Value Date/Time   NA 139 10/06/2019 0918   K 5.1 10/06/2019 0918   CL 108 10/06/2019 0918   CO2 24 10/06/2019 0918   BUN 20 10/06/2019 0918   CREATININE 0.98 10/06/2019 0918      Component Value Date/Time   CALCIUM 8.7 (L) 10/06/2019 0918    ALKPHOS 78 10/06/2019 0918   AST 11 (L) 10/06/2019 0918   ALT 7 10/06/2019 0918   BILITOT <0.2 (L) 10/06/2019 0918      EXAM: NUCLEAR MEDICINE PET SKULL BASE TO THIGH  TECHNIQUE: 6.9 mCi F-18 FDG was injected intravenously. Full-ring PET imaging was performed from the skull base to thigh after the radiotracer. CT data was obtained and used for attenuation correction and anatomic localization.  Fasting blood glucose: 105 mg/dl  COMPARISON:  08/01/2019  FINDINGS: Mediastinal blood pool activity: SUV max 2.1  Liver activity: SUV max 3.4  NECK: Symmetric palatine tonsillar activity, likely physiologic.  Incidental CT findings: none  CHEST: 1.9 by 0.8 cm sub solid right upper lobe pulmonary nodule on image 27/8, previously measuring 1.9 by 1.0 cm on 08/01/2019, maximum SUV 1.3, previously 3.2  Densely calcified right lung nodules are not hypermetabolic.  A small hypermetabolic right hilar lymph node has a maximum SUV of 9.1, formerly 8.7. Small left hilar lymph nodes noted with maximum SUV 4.1, formerly 4.8.  Incidental CT findings: None  ABDOMEN/PELVIS: Photopenic hepatic cysts. Excreted FDG in the ureters and neobladder.  Incidental CT findings: Aortoiliac atherosclerotic vascular disease.  SKELETON:Low-grade diffuse uptake, slightly increased from prior, without focal lesion identified. Relative photopenia in the sclerotic region of the L3-4 endplate sclerosis eccentric to the left.  Incidental CT findings: none  IMPRESSION: 1. Mildly reduced size/solidity of the right upper lobe pulmonary nodule, with reduction in maximum SUV from previous 3.2 to current 1.3, compatible with improvement. 2. Continued roughly stable right greater than left hypermetabolic activity in hilar lymph nodes. These could be from granulomatous process or metastatic disease, continued surveillance suggested. 3. Widespread bony activity, query granulocyte stimulation. No  focal bony hypermetabolic lesions identified.  Impression and Plan:   63 year old man with  1.  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     He completed the 6 cycles of therapy with the reasonable tolerance and without any major complications.  PET CT scan obtained on October 05, 2019 was personally reviewed and showed continued improvement in his right upper pulmonary nodule although he does not have a complete response.  Treatment options at this time were reviewed which include switch maintenance with immunotherapy, surgical resection of the pulmonary nodule, radiation therapy to that residual nodule with goal of complete ablation versus active surveillance.  Given the fact that we are dealing with an isolated metastasis now on 3 separate PET scan, I am in favor of aggressive measures with surgical resection if it can be done and if not, complete ablation with stereotactic radiosurgery at this time.  We will make the appropriate referrals for evaluation.  I will also make the appropriate referral for radiation to consider that option as well.  2. IV access:Port-A-Cath will remain in place and be flushed periodically.  3. Antiemetics: No residual nausea or vomiting at this time.  4. Renal function surveillance: His kidney function remains normal after completing cisplatin based  chemotherapy.  5. Goals of care:His disease remains curable despite the fact that we are dealing with stage IV disease given the isolated nature of metastasis.  Aggressive measures remains warranted.  6.  Neutropenia: Resolved at this time and chemotherapy has been discontinued.  7.  Anemia: Related to malignancy and chemotherapy.  Hemoglobin is adequate at this time   8.  Weight loss: His weight is stabilized at this time.  9. Follow-up: We will be in the next few weeks to follow his progress after  evaluation by thoracic surgery and radiation oncology.   25  minutes was spent with the patient face-to-face today.  More than 50% of time was spent on reviewing imaging studies, disease status update, treatment options and answering questions regarding future plan of care.      Zola Button, MD 10/26/20209:06 AM

## 2019-10-09 NOTE — Telephone Encounter (Signed)
Scheduled appt per 10/26 los.  Spoke with pt and his spouse. They are both aware of the appt date and time.

## 2019-10-09 NOTE — Progress Notes (Signed)
Thoracic Location of Tumor / Histology:  FINAL DIAGNOSIS Diagnosis Lung, needle/core biopsy(ies), RUL nodule - METASTATIC CARCINOMA CONSISTENT WITH UROTHELIAL CARCINOMA - SEE COMMENT Microscopic Comment By immunohistochemistry, the neoplastic cells are positive for cytokeratin 20, cytokeratin 7, cytokeratin 903, GATA-3 and p63 but negative for cytokeratin 5/6 and PAX-8. The overall morphology and immunophenotype are consistent with metastasis from the patient's known urothelial carcinoma. Dr. Jeannie Done reviewed the case and agrees with the above diagnosis. Patient presented 6 months ago with symptoms of: cough cxr showed mass  Biopsies of RUL (if applicable) revealed: metastatic bladder cancer  Tobacco/Marijuana/Snuff/ETOH use: former smoker  Past/Anticipated interventions by cardiothoracic surgery, if any: none   Past/Anticipated interventions by medical oncology, if any: completed chemotherapy 10-06-2019  Signs/Symptoms  Weight changes, if any: none  Respiratory complaints, if any: none  Hemoptysis, if any: none  Pain issues, if any:  none  SAFETY ISSUES:  Prior radiation? no  Pacemaker/ICD? no  Possible current pregnancy?male  Is the patient on methotrexate? no  Current Complaints / other details:  Just finished chemotherapy is going to see PCP this week to have his blood sugar checked. Wife has been checking it at home and it is around the 130-140 range. They know to bring any medication changes with them if he has any. He currently denies any issues.

## 2019-10-10 ENCOUNTER — Ambulatory Visit
Admission: RE | Admit: 2019-10-10 | Discharge: 2019-10-10 | Disposition: A | Discharge status: Home / Self Care | Payer: Medicare Other | Source: Ambulatory Visit | Attending: Radiation Oncology | Admitting: Radiation Oncology

## 2019-10-10 NOTE — Patient Instructions (Signed)
Coronavirus (COVID-19) Are you at risk?  Are you at risk for the Coronavirus (COVID-19)?  To be considered HIGH RISK for Coronavirus (COVID-19), you have to meet the following criteria:  . Traveled to [place], [place], [place], [place] or [place]; or in the [place] to [place], [place], [place], or [place]; and have fever, cough, and shortness of breath within the last 2 weeks of travel OR . Been in close contact with a person diagnosed with COVID-19 within the last 2 weeks and have fever, cough, and shortness of breath . IF YOU DO NOT MEET THESE CRITERIA, YOU ARE CONSIDERED LOW RISK FOR COVID-19.  What to do if you are HIGH RISK for COVID-19?  . If you are having a medical emergency, call 911. . Seek medical care right away. Before you go to a doctor's office, urgent care or emergency department, call ahead and tell them about your recent travel, contact with someone diagnosed with COVID-19, and your symptoms. You should receive instructions from your physician's office regarding next steps of care.  . When you arrive at healthcare provider, tell the healthcare staff immediately you have returned from visiting [place], [place], [place], [place] or [place]; or traveled in the [place] to [place], [place], [place], or [place]; in the last two weeks or you have been in close contact with a person diagnosed with COVID-19 in the last 2 weeks.   . Tell the health care staff about your symptoms: fever, cough and shortness of breath. . After you have been seen by a medical provider, you will be either: o Tested for (COVID-19) and discharged home on quarantine except to seek medical care if symptoms worsen, and asked to  - Stay home and avoid contact with others until you get your results (4-5 days)  - Avoid travel on public transportation if possible (such as bus, train, or airplane) or o Sent to the Emergency Department by EMS for evaluation, COVID-19 testing, and possible  admission depending on your condition and test results.  What to do if you are LOW RISK for COVID-19?  Reduce your risk of any infection by using the same precautions used for avoiding the common cold or flu:  . Wash your hands often with soap and warm water for at least 20 seconds.  If soap and water are not readily available, use an alcohol-based hand sanitizer with at least 60% alcohol.  . If coughing or sneezing, cover your mouth and nose by coughing or sneezing into the elbow areas of your shirt or coat, into a tissue or into your sleeve (not your hands). . Avoid shaking hands with others and consider head nods or verbal greetings only. . Avoid touching your eyes, nose, or mouth with unwashed hands.  . Avoid close contact with people who are sick. . Avoid places or events with large numbers of people in one location, like concerts or sporting events. . Carefully consider travel plans you have or are making. . If you are planning any travel outside or inside the [place], visit the CDC's Travelers' Health webpage for the latest health notices. . If you have some symptoms but not all symptoms, continue to monitor at home and seek medical attention if your symptoms worsen. . If you are having a medical emergency, call 911.   ADDITIONAL HEALTHCARE OPTIONS FOR PATIENTS  [facility] / e-Visit: [email or web address]         [facility]: [phone]  [place]   [facility]: [phone]                   [facility]: [phone]   

## 2019-10-12 ENCOUNTER — Telehealth: Payer: Self-pay

## 2019-10-12 NOTE — Telephone Encounter (Signed)
Spoke with patient they will be available to come tomorrow for the consult at 11am

## 2019-10-13 ENCOUNTER — Ambulatory Visit
Admission: RE | Admit: 2019-10-13 | Discharge: 2019-10-13 | Disposition: A | Discharge status: Home / Self Care | Payer: Medicare Other | Source: Ambulatory Visit | Attending: Radiation Oncology | Admitting: Radiation Oncology

## 2019-10-13 ENCOUNTER — Other Ambulatory Visit: Payer: Self-pay

## 2019-10-13 ENCOUNTER — Other Ambulatory Visit: Payer: Self-pay | Admitting: Oncology

## 2019-10-13 VITALS — BP 132/67 | HR 61 | Temp 98.5°F | Wt 131.6 lb

## 2019-10-13 DIAGNOSIS — C679 Malignant neoplasm of bladder, unspecified: Secondary | ICD-10-CM

## 2019-10-13 DIAGNOSIS — I517 Cardiomegaly: Secondary | ICD-10-CM | POA: Diagnosis not present

## 2019-10-13 DIAGNOSIS — C670 Malignant neoplasm of trigone of bladder: Secondary | ICD-10-CM

## 2019-10-13 DIAGNOSIS — K449 Diaphragmatic hernia without obstruction or gangrene: Secondary | ICD-10-CM | POA: Insufficient documentation

## 2019-10-13 DIAGNOSIS — Z7982 Long term (current) use of aspirin: Secondary | ICD-10-CM | POA: Diagnosis not present

## 2019-10-13 DIAGNOSIS — C7801 Secondary malignant neoplasm of right lung: Secondary | ICD-10-CM | POA: Insufficient documentation

## 2019-10-13 DIAGNOSIS — C7800 Secondary malignant neoplasm of unspecified lung: Secondary | ICD-10-CM | POA: Insufficient documentation

## 2019-10-13 DIAGNOSIS — R918 Other nonspecific abnormal finding of lung field: Secondary | ICD-10-CM

## 2019-10-13 DIAGNOSIS — K769 Liver disease, unspecified: Secondary | ICD-10-CM | POA: Diagnosis not present

## 2019-10-13 DIAGNOSIS — Z87891 Personal history of nicotine dependence: Secondary | ICD-10-CM | POA: Diagnosis not present

## 2019-10-13 DIAGNOSIS — Z8719 Personal history of other diseases of the digestive system: Secondary | ICD-10-CM | POA: Insufficient documentation

## 2019-10-13 NOTE — Patient Instructions (Signed)
Coronavirus (COVID-19) Are you at risk?  Are you at risk for the Coronavirus (COVID-19)?  To be considered HIGH RISK for Coronavirus (COVID-19), you have to meet the following criteria:  . Traveled to [place], [place], [place], [place] or [place]; or in the [place] to [place], [place], [place], or [place]; and have fever, cough, and shortness of breath within the last 2 weeks of travel OR . Been in close contact with a person diagnosed with COVID-19 within the last 2 weeks and have fever, cough, and shortness of breath . IF YOU DO NOT MEET THESE CRITERIA, YOU ARE CONSIDERED LOW RISK FOR COVID-19.  What to do if you are HIGH RISK for COVID-19?  . If you are having a medical emergency, call 911. . Seek medical care right away. Before you go to a doctor's office, urgent care or emergency department, call ahead and tell them about your recent travel, contact with someone diagnosed with COVID-19, and your symptoms. You should receive instructions from your physician's office regarding next steps of care.  . When you arrive at healthcare provider, tell the healthcare staff immediately you have returned from visiting [place], [place], [place], [place] or [place]; or traveled in the [place] to [place], [place], [place], or [place]; in the last two weeks or you have been in close contact with a person diagnosed with COVID-19 in the last 2 weeks.   . Tell the health care staff about your symptoms: fever, cough and shortness of breath. . After you have been seen by a medical provider, you will be either: o Tested for (COVID-19) and discharged home on quarantine except to seek medical care if symptoms worsen, and asked to  - Stay home and avoid contact with others until you get your results (4-5 days)  - Avoid travel on public transportation if possible (such as bus, train, or airplane) or o Sent to the Emergency Department by EMS for evaluation, COVID-19 testing, and possible  admission depending on your condition and test results.  What to do if you are LOW RISK for COVID-19?  Reduce your risk of any infection by using the same precautions used for avoiding the common cold or flu:  . Wash your hands often with soap and warm water for at least 20 seconds.  If soap and water are not readily available, use an alcohol-based hand sanitizer with at least 60% alcohol.  . If coughing or sneezing, cover your mouth and nose by coughing or sneezing into the elbow areas of your shirt or coat, into a tissue or into your sleeve (not your hands). . Avoid shaking hands with others and consider head nods or verbal greetings only. . Avoid touching your eyes, nose, or mouth with unwashed hands.  . Avoid close contact with people who are sick. . Avoid places or events with large numbers of people in one location, like concerts or sporting events. . Carefully consider travel plans you have or are making. . If you are planning any travel outside or inside the [place], visit the CDC's Travelers' Health webpage for the latest health notices. . If you have some symptoms but not all symptoms, continue to monitor at home and seek medical attention if your symptoms worsen. . If you are having a medical emergency, call 911.   ADDITIONAL HEALTHCARE OPTIONS FOR PATIENTS  [facility] / e-Visit: [email or web address]         [facility]: [phone]  [facility]: [phone]                   [facility]: [phone]   

## 2019-10-13 NOTE — Progress Notes (Signed)
Patient here for consult interpreter is present.

## 2019-10-13 NOTE — Progress Notes (Signed)
Radiation Oncology         [phone] ________________________________  Initial outpatient Consultation  Name: Robert Lowery MRN: [id number]  Date: 10/13/2019  DOB: 18-Jan-1956  CC:Robert Panda, MD  Robert Portela, MD   REFERRING PHYSICIAN: Wyatt Portela, MD  DIAGNOSIS: 63 y.o. 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    ICD-10-CM   1. Malignant neoplasm of urinary bladder, unspecified site ([place])  C67.9   2. Malignant neoplasm metastatic to right lung ([facility])  C78.01     HISTORY OF PRESENT ILLNESS: Robert Lowery is a 63 y.o. male with a diagnosis of metastatic urothelial carcinoma with disease to the right upper lung.  In summary, he initially presented with abdominal pain and hematuria and underwent CT A/P in 04/2018, which revealed a bladder mass. He proceeded to TURBT on 05/10/2018 with Dr. Jeffie Pollock. 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 He underwent radical cystectomy in 06/2018 in [place].  More recently, he developed a cough in 03/2019 and underwent chest x-ray that showed a 2.3 cm nodule in the RUL. He met with Dr. Cyndia Bent who recommended further evaluation with a PET scan which was performed on 05/09/2019 and showed an intensely hypermetabolic right upper lobe pulmonary nodule with bilateral hilar lymph node involvement and no other metastasis. He subsequently underwent CT guided core needle biopsy of the RUL lesion on 05/16/2019 with Dr. Reesa Chew, and final pathology confirmed metastatic carcinoma consistent with urothelial primary.  He was referred to Dr. Alen Blew on 05/25/2019, who recommended treatment with systemic chemotherapy given the concern for micrometastatic disease in the hilar lymph nodes. He completed 6 cycles of Gemzar/Cisplatin on 09/21/2019. His most recent restaging PET scan performed on 10/05/2019 showed a partial response to  treatment with mildly reduced size/solidity of the RUL pulmonary nodule with continued roughly stable, right greater than left hypermetabolic activity in hilar lymph nodes and widespread bony activity with no focal bony hypermetabolic lesions.  The patient reviewed the most recent restaging PET scan results with his oncologist and he has kindly been referred today for discussion of potential radiation treatment options of the persistent oligometastatic disease in the RUL lung. He is also being considered for possible surgical resection of the RUL nodule which will be discussed further with Dr. Cyndia Bent.  PREVIOUS RADIATION THERAPY: No  PAST MEDICAL HISTORY:  Past Medical History:  Diagnosis Date   Bladder cancer ([place]) 05/10/2018   TUR of the BLADDER...DR. Jeffie Pollock   Chest pain    at rest   Hearing loss    right ear   Hepatitis    history of Hep. B   History of hiatal hernia 05/30/2016   Small retrocardiac, noted on CT   Internal hemorrhoids    Liver lesion    Mild cardiomegaly    Scrotal nodule    Bilateral      PAST SURGICAL HISTORY: Past Surgical History:  Procedure Laterality Date   BIOPSY TESTIS     bilateral testicle mass   COLONOSCOPY     CYSTOSCOPY/RETROGRADE/URETEROSCOPY N/A 05/10/2018   Procedure: BILATERAL RETROGRADE;  Surgeon: [name], MD;  Location: [facility];  Service: Urology;  Laterality: N/A;   IR IMAGING GUIDED PORT INSERTION  05/31/2019   TRANSURETHRAL RESECTION OF BLADDER TUMOR WITH MITOMYCIN-C N/A 05/10/2018   Procedure: CYSTOSCOPY TRANSURETHRAL RESECTION OF BLADDER TUMOR WITH MITOMYCIN-C;  Surgeon: [name], MD;  Location: [facility];  Service: Urology;  Laterality: N/A;   UPPER GI ENDOSCOPY      FAMILY HISTORY: No family history on file.  SOCIAL HISTORY:  Social History   Socioeconomic History   Marital status: Married    Spouse name: Not on file   Number of children: Not on file   Years of education: Not on file   Highest education  level: Not on file  Occupational History   Not on file  Social Needs   Financial resource strain: Not on file   Food insecurity    Worry: Not on file    Inability: Not on file   Transportation needs    Medical: Not on file    Non-medical: Not on file  Tobacco Use   Smoking status: Former Smoker   Smokeless tobacco: Never Used  Substance and Sexual Activity   Alcohol use: Never    Frequency: Never   Drug use: Never   Sexual activity: Not on file  Lifestyle   Physical activity    Days per week: Not on file    Minutes per session: Not on file   Stress: Not on file  Relationships   Social connections    Talks on phone: Not on file    Gets together: Not on file    Attends religious service: Not on file    Active member of club or organization: Not on file    Attends meetings of clubs or organizations: Not on file    Relationship status: Not on file   Intimate partner violence    Fear of current or ex partner: Not on file    Emotionally abused: Not on file    Physically abused: Not on file    Forced sexual activity: Not on file  Other Topics Concern   Not on file  Social History Narrative   Not on file    ALLERGIES: Patient has no known allergies.  MEDICATIONS:  Current Outpatient Medications  Medication Sig Dispense Refill   aspirin 81 MG chewable tablet Chew 1 tablet (81 mg total) by mouth daily.     lidocaine-prilocaine (EMLA) cream APPLY ONE APPLICATION TOPICALLY AS NEEDED 30 g 0   prochlorperazine (COMPAZINE) 10 MG tablet TAKE ONE TABLET BY MOUTH EVERY SIX HOURS AS NEEDED FOR NAUSEA OR VOMITING 30 tablet 0   promethazine (PHENERGAN) 25 MG tablet Take 1 tablet (25 mg total) by mouth every 6 (six) hours as needed for nausea or vomiting. 50 tablet 0   No current facility-administered medications for this encounter.     REVIEW OF SYSTEMS:  On review of systems, the patient reports that he is doing well overall. He denies any chest pain, shortness  of breath, cough, fevers, chills, night sweats, unintended weight changes. He reports that the cough that he was experiencing initially, resolved once he started chemotherapy and he denies any hemoptysis.  He denies any bowel disturbances, abdominal pain, nausea or vomiting. He denies any new musculoskeletal or joint aches or pains.  A complete review of systems is obtained and is otherwise negative.    PHYSICAL EXAM:  Wt Readings from Last 3 Encounters:  10/13/19 131 lb 9.6 oz (59.7 kg)  10/09/19 133 lb 1.6 oz (60.4 kg)  09/13/19 138 lb 1.6 oz (62.6 kg)   Temp Readings from Last 3 Encounters:  10/13/19 98.5 F (36.9 C)  10/09/19 98.5 F (36.9 C) (Oral)  09/22/19 98.3 F (36.8 C) (Tympanic)   BP Readings from Last 3 Encounters:  10/13/19 132/67  10/09/19 Marland Kitchen)  128/57  09/22/19 (!) 123/41   Pulse Readings from Last 3 Encounters:  10/13/19 61  10/09/19 (!) 59  09/22/19 (!) 58    0/10  In general this is a well appearing [place] male in no acute distress. He's alert and oriented x4 and appropriate throughout the examination. Cardiopulmonary assessment is negative for acute distress and he exhibits normal effort.   KPS = 100  100 - Normal; no complaints; no evidence of disease. 90   - Able to carry on normal activity; minor signs or symptoms of disease. 80   - Normal activity with effort; some signs or symptoms of disease. 65   - Cares for self; unable to carry on normal activity or to do active work. 60   - Requires occasional assistance, but is able to care for most of his personal needs. 50   - Requires considerable assistance and frequent medical care. 57   - Disabled; requires special care and assistance. 8   - Severely disabled; hospital admission is indicated although death not imminent. 74   - Very sick; hospital admission necessary; active supportive treatment necessary. 10   - Moribund; fatal processes progressing rapidly. 0     - Dead  Karnofsky DA, Abelmann [place],  Craver LS and Burchenal [facility] [phone]) The use of the nitrogen mustards in the palliative treatment of carcinoma: with particular reference to bronchogenic carcinoma Cancer 1 634-56  LABORATORY DATA:  Lab Results  Component Value Date   WBC 8.1 10/06/2019   HGB 8.2 (L) 10/06/2019   HCT 25.5 (L) 10/06/2019   MCV 100.8 (H) 10/06/2019   PLT 238 10/06/2019   Lab Results  Component Value Date   NA 139 10/06/2019   K 5.1 10/06/2019   CL 108 10/06/2019   CO2 24 10/06/2019   Lab Results  Component Value Date   ALT 7 10/06/2019   AST 11 (L) 10/06/2019   ALKPHOS 78 10/06/2019   BILITOT <0.2 (L) 10/06/2019     RADIOGRAPHY: Nm Pet Image Restag (ps) Skull Base To Thigh  Result Date: 10/05/2019 CLINICAL DATA:  Subsequent treatment strategy for bladder cancer. EXAM: NUCLEAR MEDICINE PET SKULL BASE TO THIGH TECHNIQUE: 6.9 mCi F-18 FDG was injected intravenously. Full-ring PET imaging was performed from the skull base to thigh after the radiotracer. CT data was obtained and used for attenuation correction and anatomic localization. Fasting blood glucose: 105 mg/dl COMPARISON:  08/01/2019 FINDINGS: Mediastinal blood pool activity: SUV max 2.1 Liver activity: SUV max 3.4 NECK: Symmetric palatine tonsillar activity, likely physiologic. Incidental CT findings: none CHEST: 1.9 by 0.8 cm sub solid right upper lobe pulmonary nodule on image 27/8, previously measuring 1.9 by 1.0 cm on 08/01/2019, maximum SUV 1.3, previously 3.2 Densely calcified right lung nodules are not hypermetabolic. A small hypermetabolic right hilar lymph node has a maximum SUV of 9.1, formerly 8.7. Small left hilar lymph nodes noted with maximum SUV 4.1, formerly 4.8. Incidental CT findings: None ABDOMEN/PELVIS: Photopenic hepatic cysts. Excreted FDG in the ureters and neobladder. Incidental CT findings: Aortoiliac atherosclerotic vascular disease. SKELETON:Low-grade diffuse uptake, slightly increased from prior, without focal lesion  identified. Relative photopenia in the sclerotic region of the L3-4 endplate sclerosis eccentric to the left. Incidental CT findings: none IMPRESSION: 1. Mildly reduced size/solidity of the right upper lobe pulmonary nodule, with reduction in maximum SUV from previous 3.2 to current 1.3, compatible with improvement. 2. Continued roughly stable right greater than left hypermetabolic activity in hilar lymph nodes. These could be from granulomatous  process or metastatic disease, continued surveillance suggested. 3. Widespread bony activity, query granulocyte stimulation. No focal bony hypermetabolic lesions identified. Electronically Signed   By: Van Clines M.D.   On: 10/05/2019 13:26      IMPRESSION/PLAN: This visit was conducted onsite via WebEx with use of medical interpreter to spare the patient unnecessary potential exposure in the healthcare setting during the current COVID-19 pandemic.  1. 63 y.o. 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 Today, we talked to the patient, via medical interpreter, about the findings and workup thus far. We discussed the natural history of oligometastatic urothelial carcinoma and general treatment, highlighting the role of focused stereotactic radiotherapy in the management. We discussed the available radiation techniques, and focused on the details of logistics and delivery as it pertains to stereotactic body radiotherapy (SBRT) and compared and contrasted this with surgical resection.  We discussed the potential option of surgical resection which will be determined through discussion with Dr. Cyndia Bent.  Should he not be deemed a good surgical candidate or should the patient opt to forego surgery, we would recommend a 3-5 fraction course of SBRT directed to the residual RUL nodule.  We reviewed the anticipated acute and late sequelae associated with radiation in this setting. The patient was encouraged to ask questions  that were answered to his satisfaction.  At the end of the conversation, the patient appears to have a good understanding of his disease and our recommendations for treatment of curative intent.   He is interested in learning more about the option of surgery prior to making a final decision.  We will share our discussion with Dr. Alen Blew and Dr. Cyndia Bent and will look forward to continuing to follow in his care.  Of course, we would be more than happy to continue to participate in his care, offering SBRT for treatment of his oligometastatic disease in the RUL, should he ultimately decide to pursue radiotherapy. We will plan to follow up with the patient next week to further discuss his treatment options and recommendations following further communication with Dr. Alen Blew.  Given current concerns for patient exposure during the COVID-19 pandemic, this encounter was conducted via video-enabled WebEx visit with the patient on site with a medical interpreter present. The patient has given verbal consent for this type of encounter. The time spent during this encounter was 60 minutes. The attendants for this meeting include Tyler Pita MD, Ashlyn Bruning PA-C, [place], patient, Daryn Hicks and a medical interpreter. During the encounter, Tyler Pita MD, Freeman Caldron PA-C,  scribe, Wilburn Mylar, patient, [name] and medical interpreter were all located at [facility] Radiation Oncology Department.     Nicholos Johns, PA-C    Tyler Pita, MD  [facility] Direct Dial: [phone]   Fax: [phone] [email or web address]   Skype   [email or web address]   This document serves as a record of services personally performed by Tyler Pita, MD and Freeman Caldron, PA-C. It was created on their behalf by Wilburn Mylar, a trained medical scribe. The creation of this record is based on the scribe's personal observations and the provider's statements  to them. This document has been checked and approved by the attending provider.

## 2019-10-18 ENCOUNTER — Encounter: Payer: Self-pay | Admitting: Surgery

## 2019-10-18 ENCOUNTER — Ambulatory Visit (INDEPENDENT_AMBULATORY_CARE_PROVIDER_SITE_OTHER): Payer: Medicare Other | Admitting: Surgery

## 2019-10-18 ENCOUNTER — Other Ambulatory Visit: Payer: Self-pay

## 2019-10-18 VITALS — BP 128/66 | HR 69 | Temp 97.7°F | Resp 16 | Ht 64.0 in | Wt 135.8 lb

## 2019-10-18 DIAGNOSIS — C7801 Secondary malignant neoplasm of right lung: Secondary | ICD-10-CM | POA: Diagnosis not present

## 2019-10-19 ENCOUNTER — Encounter: Payer: Self-pay | Admitting: *Deleted

## 2019-10-19 ENCOUNTER — Encounter: Payer: Self-pay | Admitting: Surgery

## 2019-10-19 ENCOUNTER — Other Ambulatory Visit: Payer: Self-pay | Admitting: *Deleted

## 2019-10-19 DIAGNOSIS — R911 Solitary pulmonary nodule: Secondary | ICD-10-CM

## 2019-10-19 NOTE — Progress Notes (Signed)
PCP is [name], MD Referring Provider is [name], MD  Chief Complaint  Patient presents with  . Follow-up    to discuss thoracic surgery after completing chemotherapy    HPI:  The patient is a 63 year old [place] gentleman who is here today with a [place] interpreter.  I saw him initially on 04/28/2019 for evaluation of a right upper lobe nodule.  He has a history of high-grade infiltrative bladder cancer that was resected by TURBT with instillation of mitomycin-C by Dr. Jeffie Pollock on 05/10/2018.  He subsequently had a recurrence and went to [place] where he underwent cystectomy with formation of a neobladder in 06/2018.  He had a chest x-ray on 04/10/2019 which showed a new 2.3 cm nodule in the right upper lobe which had not been present on his previous chest x-ray from 10/21/2018.  There are calcified granulomata in the right lung that were stable.  He subsequently underwent a CT scan of the chest on 04/20/2019.  This showed a 2.0 x 2.0 cm right upper lobe solid-appearing pulmonary nodule.  There was also a groundglass density measuring 1.2 x 1.2 cm in the right upper lobe which was stable from a previous CT scan dated 06/10/2018.  He underwent a PET scan on 05/09/2019 that showed bilateral hilar lymph nodes that had hypermetabolic activity with an SUV max of 11.69 on the right and 6.48 on the left which was suspicious for hilar lymph node metastasis.  The hypermetabolic hilar lymph nodes do not appear particularly enlarged.  He subsequently underwent CT-guided core needle biopsy of the right upper lobe nodule which showed metastatic urothelial carcinoma.  With this diagnosis and the hypermetabolic bilateral hilar lymphadenopathy I do not feel that he was a candidate for surgical resection at that time and should be evaluated by oncology.  He subsequently underwent 6 cycles of chemotherapy under the direction of Dr. Alen Blew which finished on September 21, 2019.  He underwent a repeat PET scan on 10/05/2019  which showed a significant reduction in the size and solidity of the right upper lobe pulmonary nodule with a reduction in the maximum SUV to 1.3.  The bilateral hilar hypermetabolic lymph node activity was stable and it was felt that this could be due to granulomatous disease or metastatic disease.  There was diffuse widespread bony activity but no focal bony hypermetabolic lesions identified.  He said that he feels fairly well overall.  His appetite has been good.  He denies any respiratory problems. Past Medical History:  Diagnosis Date  . Bladder cancer ([place]) 05/10/2018   TUR of the BLADDER...DR. Jeffie Pollock  . Chest pain    at rest  . Hearing loss    right ear  . Hepatitis    history of Hep. B  . History of hiatal hernia 05/30/2016   Small retrocardiac, noted on CT  . Internal hemorrhoids   . Liver lesion   . Mild cardiomegaly   . Scrotal nodule    Bilateral    Past Surgical History:  Procedure Laterality Date  . BIOPSY TESTIS     bilateral testicle mass  . COLONOSCOPY    . CYSTOSCOPY/RETROGRADE/URETEROSCOPY N/A 05/10/2018   Procedure: BILATERAL RETROGRADE;  Surgeon: [name], MD;  Location: [facility];  Service: Urology;  Laterality: N/A;  . IR IMAGING GUIDED PORT INSERTION  05/31/2019  . TRANSURETHRAL RESECTION OF BLADDER TUMOR WITH MITOMYCIN-C N/A 05/10/2018   Procedure: CYSTOSCOPY TRANSURETHRAL RESECTION OF BLADDER TUMOR WITH MITOMYCIN-C;  Surgeon: [name], MD;  Location: [facility];  Service: Urology;  Laterality: N/A;  . UPPER GI ENDOSCOPY      History reviewed. No pertinent family history.  Social History Social History   Tobacco Use  . Smoking status: Former Research scientist (life sciences)  . Smokeless tobacco: Never Used  Substance Use Topics  . Alcohol use: Never    Frequency: Never  . Drug use: Never    Current Outpatient Medications  Medication Sig Dispense Refill  . aspirin 81 MG chewable tablet Chew 1 tablet (81 mg total) by mouth daily.    Marland Kitchen lidocaine-prilocaine (EMLA) cream  APPLY ONE APPLICATION TOPICALLY AS NEEDED 30 g 0  . prochlorperazine (COMPAZINE) 10 MG tablet TAKE ONE TABLET BY MOUTH EVERY SIX HOURS AS NEEDED FOR NAUSEA OR VOMITING 30 tablet 0  . promethazine (PHENERGAN) 25 MG tablet Take 1 tablet (25 mg total) by mouth every 6 (six) hours as needed for nausea or vomiting. 50 tablet 0   No current facility-administered medications for this visit.     No Known Allergies  Review of Systems  Constitutional: Negative.   HENT: Negative.   Eyes: Negative.   Respiratory: Negative.   Cardiovascular: Negative.   Gastrointestinal: Negative.   Endocrine: Negative.   Genitourinary: Negative.   Musculoskeletal: Negative.   Allergic/Immunologic: Negative.   Neurological: Negative.   Hematological: Negative.   Psychiatric/Behavioral: Negative.     BP 128/66 (BP Location: Right Arm, Patient Position: Sitting, Cuff Size: Normal)   Pulse 69   Temp 97.7 F (36.5 C)   Resp 16   Ht 5\' 4"  (1.626 m)   Wt 135 lb 12.8 oz (61.6 kg)   SpO2 96% Comment: RA  BMI 23.31 kg/m  Physical Exam Constitutional:      Appearance: Normal appearance. He is normal weight.  HENT:     Head: Normocephalic and atraumatic.     Mouth/Throat:     Mouth: Mucous membranes are moist.     Pharynx: Oropharynx is clear.  Eyes:     Extraocular Movements: Extraocular movements intact.     Conjunctiva/sclera: Conjunctivae normal.     Pupils: Pupils are equal, round, and reactive to light.  Neck:     Musculoskeletal: Normal range of motion and neck supple. No neck rigidity.  Cardiovascular:     Rate and Rhythm: Normal rate and regular rhythm.     Pulses: Normal pulses.     Heart sounds: Normal heart sounds. No murmur.  Pulmonary:     Effort: Pulmonary effort is normal.     Breath sounds: Normal breath sounds.  Abdominal:     General: Abdomen is flat. Bowel sounds are normal.     Palpations: Abdomen is soft.  Musculoskeletal:        General: No swelling or tenderness.   Lymphadenopathy:     Cervical: No cervical adenopathy.  Skin:    General: Skin is warm and dry.  Neurological:     General: No focal deficit present.     Mental Status: He is alert and oriented to person, place, and time.  Psychiatric:        Mood and Affect: Mood normal.        Behavior: Behavior normal.      Diagnostic Tests:  CLINICAL DATA:  Subsequent treatment strategy for bladder cancer.  EXAM: NUCLEAR MEDICINE PET SKULL BASE TO THIGH  TECHNIQUE: 6.9 mCi F-18 FDG was injected intravenously. Full-ring PET imaging was performed from the skull base to thigh after the radiotracer. CT data was obtained and used for  attenuation correction and anatomic localization.  Fasting blood glucose: 105 mg/dl  COMPARISON:  08/01/2019  FINDINGS: Mediastinal blood pool activity: SUV max 2.1  Liver activity: SUV max 3.4  NECK: Symmetric palatine tonsillar activity, likely physiologic.  Incidental CT findings: none  CHEST: 1.9 by 0.8 cm sub solid right upper lobe pulmonary nodule on image 27/8, previously measuring 1.9 by 1.0 cm on 08/01/2019, maximum SUV 1.3, previously 3.2  Densely calcified right lung nodules are not hypermetabolic.  A small hypermetabolic right hilar lymph node has a maximum SUV of 9.1, formerly 8.7. Small left hilar lymph nodes noted with maximum SUV 4.1, formerly 4.8.  Incidental CT findings: None  ABDOMEN/PELVIS: Photopenic hepatic cysts. Excreted FDG in the ureters and neobladder.  Incidental CT findings: Aortoiliac atherosclerotic vascular disease.  SKELETON:Low-grade diffuse uptake, slightly increased from prior, without focal lesion identified. Relative photopenia in the sclerotic region of the L3-4 endplate sclerosis eccentric to the left.  Incidental CT findings: none  IMPRESSION: 1. Mildly reduced size/solidity of the right upper lobe pulmonary nodule, with reduction in maximum SUV from previous 3.2 to current 1.3,  compatible with improvement. 2. Continued roughly stable right greater than left hypermetabolic activity in hilar lymph nodes. These could be from granulomatous process or metastatic disease, continued surveillance suggested. 3. Widespread bony activity, query granulocyte stimulation. No focal bony hypermetabolic lesions identified.   Electronically Signed   By: Van Clines M.D.   On: 10/05/2019 13:26  Impression:  This 63 year old gentleman has stage IV bladder cancer with a single pulmonary metastasis and mild bilateral hilar hypermetabolic lymphadenopathy.  He completed 6 cycles of chemotherapy with a significant decrease in the size and hypermetabolic activity of the right upper lobe lung nodule.  The lymph node size and hypermetabolic activity in the hila are unchanged.  It is certainly possible that this could be metastatic hilar lymphadenopathy but given the significant improvement in the appearance of the right upper lobe lung nodule I would expect that if the hilar lymph nodes had metastatic cancer they would have also significantly improved.  It may be that this hilar hypermetabolic lymphadenopathy is inflammatory.  There are no other hypermetabolic lesions seen on the PET scan.  I think the options for treatment of the residual right upper lobe nodule are surgical resection versus radiation therapy.  I discussed those options with the patient.  I think surgical resection may decrease the chance of local recurrence but I do not think there is any evidence that it will improve his long-term survival compared to radiation therapy.  The patient is young and doing well clinically and I think he would tolerate surgical resection.  He would rather undergo surgical resection to remove the lesion and then radiation therapy.  I reviewed the PET scan images with him and answered all of his questions via the interpreter.  I discussed the operative procedure including alternatives, benefits,  and risk including but not limited to bleeding, blood transfusion, infection, prolonged air leak, a positive surgical margin possibly requiring further radiation therapy, and recurrence of the cancer at this or other sites.  He understands all this and agrees to proceed with surgery.  His last hemoglobin was 8.2 on 10/06/2019 and was improving from when it was 6.9 on 09/13/2019.  I would expect this to continue to improve over the next few weeks but there is still a chance that he may require transfusion.  Plan:  He will be scheduled for right thoracotomy and wedge resection of the right upper  lobe lung nodule on Monday, 10/30/2019.  I spent 45 minutes performing this consultation and > 50% of this time was spent face to face counseling and coordinating the care of this patient's right upper lobe lung nodule.   Gaye Pollack, MD [facility] [phone]

## 2019-10-23 ENCOUNTER — Inpatient Hospital Stay: Payer: Medicare Other | Attending: Oncology | Admitting: Oncology

## 2019-10-23 ENCOUNTER — Other Ambulatory Visit: Payer: Self-pay

## 2019-10-23 VITALS — BP 128/51 | HR 57 | Temp 98.2°F | Resp 18 | Ht 64.0 in | Wt 131.5 lb

## 2019-10-23 DIAGNOSIS — Z9221 Personal history of antineoplastic chemotherapy: Secondary | ICD-10-CM | POA: Insufficient documentation

## 2019-10-23 DIAGNOSIS — T451X5A Adverse effect of antineoplastic and immunosuppressive drugs, initial encounter: Secondary | ICD-10-CM | POA: Diagnosis not present

## 2019-10-23 DIAGNOSIS — C670 Malignant neoplasm of trigone of bladder: Secondary | ICD-10-CM | POA: Insufficient documentation

## 2019-10-23 DIAGNOSIS — D6481 Anemia due to antineoplastic chemotherapy: Secondary | ICD-10-CM | POA: Insufficient documentation

## 2019-10-23 DIAGNOSIS — Z7982 Long term (current) use of aspirin: Secondary | ICD-10-CM | POA: Diagnosis not present

## 2019-10-23 DIAGNOSIS — Z79899 Other long term (current) drug therapy: Secondary | ICD-10-CM | POA: Insufficient documentation

## 2019-10-23 DIAGNOSIS — R911 Solitary pulmonary nodule: Secondary | ICD-10-CM | POA: Insufficient documentation

## 2019-10-23 DIAGNOSIS — Z95828 Presence of other vascular implants and grafts: Secondary | ICD-10-CM | POA: Insufficient documentation

## 2019-10-23 NOTE — Progress Notes (Signed)
Hematology and Oncology Follow Up Visit  Robert Lowery [id number] [birth year] 63 y.o. 10/23/2019 9:34 AM Robert Lowery, MDMoreira, [name], MD   Principle Diagnosis: 63 year old with stage IV bladder cancer documented in June 2020.  He was initially diagnosed with localized disease in 2019.  Prior Therapy:   He is status post cystectomy done in July 2019.  Surgery performed in [place]. He is status post lung biopsy completed on June 2 of 2020. Gemcitabine and cisplatin chemotherapy cycle 1 started on 06/02/2019.  He completed 6 cycles of therapy on September 21, 2019.  He had a near complete response with a residual pulmonary nodule.  Current therapy: Under evaluation for surgical resection.    Interim History: Robert Lowery is here for a follow-up.  Since the last visit, he was evaluated by radiation oncology as well as thoracic surgery for his residual pulmonary nodule.  He reports no residual complications associated with cisplatin chemotherapy.  He denies any nausea vomiting or worsening neuropathy.  Performance status and quality of life remain excellent he denies any issues associated with previous chemotherapy regimens.   Patient denied headaches, blurry vision, syncope or seizures.  Denies any fevers, chills or sweats.  Denied chest pain, palpitation, orthopnea or leg edema.  Denied cough, wheezing or hemoptysis.  Denied nausea, vomiting or abdominal pain.  Denies any constipation or diarrhea.  Denies any frequency urgency or hesitancy.  Denies any arthralgias or myalgias.  Denies any skin rashes or lesions.  Denies any bleeding or clotting tendency.  Denies any easy bruising.  Denies any hair or nail changes.  Denies any anxiety or depression.  Remaining review of system is negative.                Medications: Without any changes on review. Current Outpatient Medications  Medication Sig Dispense Refill  . Ascorbic Acid (VITAMIN C) 1000 MG tablet Take 1,000 mg by mouth daily.    Marland Kitchen  aspirin 81 MG chewable tablet Chew 1 tablet (81 mg total) by mouth daily.    . cholecalciferol (VITAMIN D3) 25 MCG (1000 UT) tablet Take 1,000 Units by mouth daily.    Marland Kitchen CINNAMON PO Take 1,000 mg by mouth daily.    . lansoprazole (PREVACID) 15 MG capsule Take 15 mg by mouth daily before breakfast.    . lidocaine-prilocaine (EMLA) cream APPLY ONE APPLICATION TOPICALLY AS NEEDED (Patient not taking: Reported on 10/19/2019) 30 g 0  . Multiple Vitamin (MULTIVITAMIN WITH MINERALS) TABS tablet Take 1 tablet by mouth daily.    . prochlorperazine (COMPAZINE) 10 MG tablet TAKE ONE TABLET BY MOUTH EVERY SIX HOURS AS NEEDED FOR NAUSEA OR VOMITING 30 tablet 0  . promethazine (PHENERGAN) 25 MG tablet Take 1 tablet (25 mg total) by mouth every 6 (six) hours as needed for nausea or vomiting. (Patient not taking: Reported on 10/19/2019) 50 tablet 0   No current facility-administered medications for this visit.      Allergies: No Known Allergies  Past Medical History, Surgical history, Social history, and Family History updated without any changes.   Physical Exam:     Blood pressure (!) 128/51, pulse (!) 57, temperature 98.2 F (36.8 C), temperature source Temporal, resp. rate 18, height 5\' 4"  (1.626 m), weight 131 lb 8 oz (59.6 kg), SpO2 100 %.    ECOG: 0    General appearance: Comfortable appearing without any discomfort Head: Normocephalic without any trauma Oropharynx: Mucous membranes are moist and pink without any thrush or ulcers. Eyes: Pupils  are equal and round reactive to light. Lymph nodes: No cervical, supraclavicular, inguinal or axillary lymphadenopathy.   Heart:regular rate and rhythm.  S1 and S2 without leg edema. Lung: Clear without any rhonchi or wheezes.  No dullness to percussion. Abdomin: Soft, nontender, nondistended with good bowel sounds.  No hepatosplenomegaly. Musculoskeletal: No joint deformity or effusion.  Full range of motion noted. Neurological: No deficits noted  on motor, sensory and deep tendon reflex exam. Skin: No petechial rash or dryness.  Appeared moist.         Lab Results: Lab Results  Component Value Date   WBC 8.1 10/06/2019   HGB 8.2 (L) 10/06/2019   HCT 25.5 (L) 10/06/2019   MCV 100.8 (H) 10/06/2019   PLT 238 10/06/2019     Chemistry      Component Value Date/Time   NA 139 10/06/2019 0918   K 5.1 10/06/2019 0918   CL 108 10/06/2019 0918   CO2 24 10/06/2019 0918   BUN 20 10/06/2019 0918   CREATININE 0.98 10/06/2019 0918      Component Value Date/Time   CALCIUM 8.7 (L) 10/06/2019 0918   ALKPHOS 78 10/06/2019 0918   AST 11 (L) 10/06/2019 0918   ALT 7 10/06/2019 0918   BILITOT <0.2 (L) 10/06/2019 0918       Impression and Plan:   63 year old man with  1.  Bladder cancer diagnosed in 2019.  He subsequently was found to have stage IV high-grade urothelial carcinoma with pulmonary involvement.     After completing chemotherapy had an excellent response although he continues to have residual pulmonary nodule.  The natural course of this disease and treatment options were reiterated.  These would include continuing systemic therapy with switch maintenance immune agents, surgical resection versus radiation therapy.  At this time he opted to proceed with surgical resection and we will repeat imaging studies to ensure no residual tumor is detected.  He understands he might require further systemic therapy if any residual disease is detected.  2. IV access:Port-A-Cath will continue to be flushed periodically.   3.  Anemia: Related to chemotherapy with hemoglobin improving at this time.    4. Follow-up: He will return in the next 2 months for repeat imaging and follow-up.   25  minutes was spent with the patient face-to-face today.  More than 50% of time was dedicated to reviewing his disease status, discussing treatment options as well as future plan of care.      Zola Button, MD 11/9/20209:34 AM

## 2019-10-24 ENCOUNTER — Telehealth: Payer: Self-pay | Admitting: Oncology

## 2019-10-24 NOTE — Telephone Encounter (Signed)
Scheduled appt per 11/9 los. ° °Spoke with pt and they are aware of the appt date and time. °

## 2019-10-26 ENCOUNTER — Other Ambulatory Visit (HOSPITAL_COMMUNITY)
Admission: RE | Admit: 2019-10-26 | Discharge: 2019-10-26 | Disposition: A | Discharge status: Home / Self Care | Payer: Medicare Other | Source: Ambulatory Visit

## 2019-10-26 ENCOUNTER — Encounter (HOSPITAL_COMMUNITY)
Admission: RE | Admit: 2019-10-26 | Discharge: 2019-10-26 | Disposition: A | Discharge status: Home / Self Care | Payer: Medicare Other | Source: Ambulatory Visit | Attending: Surgery | Admitting: Surgery

## 2019-10-26 ENCOUNTER — Other Ambulatory Visit: Payer: Self-pay

## 2019-10-26 ENCOUNTER — Encounter (HOSPITAL_COMMUNITY): Payer: Self-pay

## 2019-10-26 DIAGNOSIS — I517 Cardiomegaly: Secondary | ICD-10-CM | POA: Diagnosis not present

## 2019-10-26 DIAGNOSIS — C679 Malignant neoplasm of bladder, unspecified: Secondary | ICD-10-CM | POA: Insufficient documentation

## 2019-10-26 DIAGNOSIS — Z01818 Encounter for other preprocedural examination: Secondary | ICD-10-CM | POA: Insufficient documentation

## 2019-10-26 DIAGNOSIS — D6481 Anemia due to antineoplastic chemotherapy: Secondary | ICD-10-CM | POA: Insufficient documentation

## 2019-10-26 DIAGNOSIS — Z20828 Contact with and (suspected) exposure to other viral communicable diseases: Secondary | ICD-10-CM | POA: Insufficient documentation

## 2019-10-26 DIAGNOSIS — R911 Solitary pulmonary nodule: Secondary | ICD-10-CM | POA: Insufficient documentation

## 2019-10-26 DIAGNOSIS — R001 Bradycardia, unspecified: Secondary | ICD-10-CM | POA: Diagnosis not present

## 2019-10-26 DIAGNOSIS — I440 Atrioventricular block, first degree: Secondary | ICD-10-CM | POA: Insufficient documentation

## 2019-10-26 HISTORY — DX: Prediabetes: R73.03

## 2019-10-26 HISTORY — DX: Malignant neoplasm of unspecified part of unspecified bronchus or lung: C34.90

## 2019-10-26 LAB — HEMOGLOBIN A1C
Hgb A1c MFr Bld: 4.9 % (ref 4.8–5.6)
Mean Plasma Glucose: 93.93 mg/dL

## 2019-10-26 LAB — COMPREHENSIVE METABOLIC PANEL
ALT: 15 U/L (ref 0–44)
AST: 17 U/L (ref 15–41)
Albumin: 4 g/dL (ref 3.5–5.0)
Alkaline Phosphatase: 40 U/L (ref 38–126)
Anion gap: 10 (ref 5–15)
BUN: 21 mg/dL (ref 8–23)
CO2: 22 mmol/L (ref 22–32)
Calcium: 9 mg/dL (ref 8.9–10.3)
Chloride: 106 mmol/L (ref 98–111)
Creatinine, Ser: 1 mg/dL (ref 0.61–1.24)
GFR calc Af Amer: >60 mL/min (ref >60)
GFR calc non Af Amer: >60 mL/min (ref >60)
Glucose, Bld: 103 mg/dL — ABNORMAL HIGH (ref 70–99)
Potassium: 4.7 mmol/L (ref 3.5–5.1)
Sodium: 138 mmol/L (ref 135–145)
Total Bilirubin: 0.7 mg/dL (ref 0.3–1.2)
Total Protein: 6.6 g/dL (ref 6.5–8.1)

## 2019-10-26 LAB — SURGICAL PCR SCREEN
MRSA, PCR: NEGATIVE
Staphylococcus aureus: NEGATIVE

## 2019-10-26 LAB — URINALYSIS, ROUTINE W REFLEX MICROSCOPIC
Bilirubin Urine: NEGATIVE
Glucose, UA: NEGATIVE mg/dL
Ketones, ur: NEGATIVE mg/dL
Leukocytes,Ua: NEGATIVE
Nitrite: NEGATIVE
Protein, ur: NEGATIVE mg/dL
Specific Gravity, Urine: 1.015 (ref 1.005–1.030)
pH: 7 (ref 5.0–8.0)

## 2019-10-26 LAB — PROTIME-INR
INR: 1.1 (ref 0.8–1.2)
Prothrombin Time: 13.9 seconds (ref 11.4–15.2)

## 2019-10-26 LAB — CBC
HCT: 29.9 % — ABNORMAL LOW (ref 39.0–52.0)
Hemoglobin: 9.4 g/dL — ABNORMAL LOW (ref 13.0–17.0)
MCH: 32.6 pg (ref 26.0–34.0)
MCHC: 31.4 g/dL (ref 30.0–36.0)
MCV: 103.8 fL — ABNORMAL HIGH (ref 80.0–100.0)
Platelets: 218 10*3/uL (ref 150–400)
RBC: 2.88 MIL/uL — ABNORMAL LOW (ref 4.22–5.81)
RDW: 13.4 % (ref 11.5–15.5)
WBC: 2.8 10*3/uL — ABNORMAL LOW (ref 4.0–10.5)
nRBC: 0 % (ref 0.0–0.2)

## 2019-10-26 LAB — GLUCOSE, CAPILLARY: Glucose-Capillary: 92 mg/dL (ref 70–99)

## 2019-10-26 LAB — BLOOD GAS, ARTERIAL
Acid-Base Excess: 0.3 mmol/L (ref 0.0–2.0)
Bicarbonate: 24.7 mmol/L (ref 20.0–28.0)
FIO2: 21
O2 Saturation: 96.6 %
Patient temperature: 37
pCO2 arterial: 41.2 mmHg (ref 32.0–48.0)
pH, Arterial: 7.394 (ref 7.350–7.450)
pO2, Arterial: 97 mmHg (ref 83.0–108.0)

## 2019-10-26 LAB — TYPE AND SCREEN
ABO/RH(D): A POS
Antibody Screen: NEGATIVE

## 2019-10-26 LAB — URINALYSIS, MICROSCOPIC (REFLEX)

## 2019-10-26 LAB — APTT: aPTT: 28 seconds (ref 24–36)

## 2019-10-26 LAB — ABO/RH: ABO/RH(D): A POS

## 2019-10-26 NOTE — Progress Notes (Addendum)
Patient stated that he did not need an interpreter for the appointment today, patient and I went over his instructions twice and performed teach back instructions.  Patient understands instructions  1:47 PM Blood bank called stating that the patient refused blood about a month ago, patient verbalized that he was ok with receiving blood in an emergency and signed a consent for blood products, consent faxed to blood bank.  PCP - [name] Cardiologist - denies   Chest x-ray - DOS EKG - 10/26/19 Stress Test - denies ECHO - denies Cardiac Cath - denies  Fasting Blood Sugar - 110/120s - patients stated that his blood sugar has been elevated since chemotherapy treatment and he has been monitoring his blood sugar at home  Checks Blood Sugar __4-5___ times a day   Aspirin Instructions: continue but not to take it day of    COVID TEST- 10/26/19   Anesthesia review: No  Patient denies shortness of breath, fever, cough and chest pain at PAT appointment   All instructions explained to the patient, with a verbal understanding of the material. Patient agrees to go over the instructions while at home for a better understanding. Patient also instructed to self quarantine after being tested for COVID-19. The opportunity to ask questions was provided.

## 2019-10-26 NOTE — Progress Notes (Signed)
[facility] # [address], [address] [place] [address] Phone: [phone] Fax: [phone]      Your procedure is scheduled on November 16  Report to [facility] Main Entrance "A" at 0530 A.M., and check in at the Admitting office.  Call this number if you have problems the morning of surgery:  [phone]  Call [phone] if you have any questions prior to your surgery date Monday-Friday 8am-4pm    Remember:  Do not eat or drink after midnight the night before your surgery    Take these medicines the morning of surgery with A SIP OF WATER  lansoprazole (PREVACID)  Continue Aspirin but do not take it the day of surgery  As of today, STOP taking any Aleve, Naproxen, Ibuprofen, Motrin, Advil, Goody's, BC's, all herbal medications, fish oil, and all vitamins.    The Morning of Surgery  Do not wear jewelry  Do not wear lotions, powders, or colognes, or deodorant   Men may shave face and neck.  Do not bring valuables to the hospital.  [facility] is not responsible for any belongings or valuables.  If you are a smoker, DO NOT Smoke 24 hours prior to surgery IF you wear a CPAP at night please bring your mask, tubing, and machine the morning of surgery   Remember that you must have someone to transport you home after your surgery, and remain with you for 24 hours if you are discharged the same day.   Contacts, glasses, hearing aids, dentures or bridgework may not be worn into surgery.    Leave your suitcase in the car.  After surgery it may be brought to your room.  For patients admitted to the hospital, discharge time will be determined by your treatment team.  Patients discharged the day of surgery will not be allowed to drive home.    Special instructions:   [facility]- Preparing For Surgery  Before surgery, you can play an important role. Because skin is not sterile, your skin needs to be as free of germs as  possible. You can reduce the number of germs on your skin by washing with CHG (chlorahexidine gluconate) Soap before surgery.  CHG is an antiseptic cleaner which kills germs and bonds with the skin to continue killing germs even after washing.    Oral Hygiene is also important to reduce your risk of infection.  Remember - BRUSH YOUR TEETH THE MORNING OF SURGERY WITH YOUR REGULAR TOOTHPASTE  Please do not use if you have an allergy to CHG or antibacterial soaps. If your skin becomes reddened/irritated stop using the CHG.  Do not shave (including legs and underarms) for at least 48 hours prior to first CHG shower. It is OK to shave your face.  Please follow these instructions carefully.   1. Shower the NIGHT BEFORE SURGERY and the MORNING OF SURGERY with CHG Soap.   2. If you chose to wash your hair, wash your hair first as usual with your normal shampoo.  3. After you shampoo, rinse your hair and body thoroughly to remove the shampoo.  4. Use CHG as you would any other liquid soap. You can apply CHG directly to the skin and wash gently with a scrungie or a clean washcloth.   5. Apply the CHG Soap to your body ONLY FROM THE NECK DOWN.  Do not use on open wounds or open sores. Avoid contact with your eyes, ears, mouth and  genitals (private parts). Wash Face and genitals (private parts)  with your normal soap.   6. Wash thoroughly, paying special attention to the area where your surgery will be performed.  7. Thoroughly rinse your body with warm water from the neck down.  8. DO NOT shower/wash with your normal soap after using and rinsing off the CHG Soap.  9. Pat yourself dry with a CLEAN TOWEL.  10. Wear CLEAN PAJAMAS to bed the night before surgery, wear comfortable clothes the morning of surgery  11. Place CLEAN SHEETS on your bed the night of your first shower and DO NOT SLEEP WITH PETS.    Day of Surgery:  Do not apply any deodorants/lotions. Please shower the morning of surgery  with the CHG soap  Please wear clean clothes to the hospital/surgery center.   Remember to brush your teeth WITH YOUR REGULAR TOOTHPASTE.   Please read over the following fact sheets that you were given.

## 2019-10-27 LAB — NOVEL CORONAVIRUS, NAA (HOSP ORDER, SEND-OUT TO REF LAB; TAT 18-24 HRS): SARS-CoV-2, NAA: NOT DETECTED

## 2019-10-27 NOTE — Progress Notes (Signed)
Anesthesia Chart Review: Per oncology notes by Dr. Alen Blew, "Stage IV bladder cancer documented in June 2020.  He was initially diagnosed with localized disease in 2019. He is status post cystectomy done in July 2019.  Surgery performed in [place]. He is status post lung biopsy completed on June 2 of 2020. Gemcitabine and cisplatin chemotherapy cycle 1 started on 06/02/2019.  He completed 6 cycles of therapy on September 21, 2019.  He had a near complete response with a residual pulmonary nodule." He has now opted for surgical resection of RUL nodule. 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 Cyndia Bent is aware of. Hgb 9.4 on preop labs 10/26/19, up from 8.2 on 10/23. Remainder of preop labs WNL.   EKG 10/26/19: Sinus bradycardia. Rate 51. Left ventricular hypertrophy. First degree heart block. No significant change since last tracing 10/10/08   [name] [facility] Short Stay Center/Anesthesiology Phone [phone] 10/27/2019 9:15 AM

## 2019-10-27 NOTE — Anesthesia Preprocedure Evaluation (Addendum)
Anesthesia Evaluation  Patient identified by MRN, date of birth, ID band Patient awake    Reviewed: Allergy & Precautions, NPO status , Patient's Chart, lab work & pertinent test results  Airway Mallampati: II  TM Distance: >3 FB     Dental   Pulmonary former smoker,    breath sounds clear to auscultation       Cardiovascular  Rhythm:Regular Rate:Normal  History noted CG   Neuro/Psych    GI/Hepatic hiatal hernia, (+) Hepatitis -  Endo/Other  negative endocrine ROS  Renal/GU negative Renal ROS     Musculoskeletal   Abdominal   Peds  Hematology   Anesthesia Other Findings   Reproductive/Obstetrics                           Anesthesia Physical Anesthesia Plan  ASA: III  Anesthesia Plan: General   Post-op Pain Management:    Induction: Intravenous  PONV Risk Score and Plan: 2 and Ondansetron and Midazolam  Airway Management Planned: Double Lumen EBT  Additional Equipment: Arterial line  Intra-op Plan:   Post-operative Plan: Possible Post-op intubation/ventilation  Informed Consent: I have reviewed the patients History and Physical, chart, labs and discussed the procedure including the risks, benefits and alternatives for the proposed anesthesia with the patient or authorized representative who has indicated his/her understanding and acceptance.     Dental advisory given  Plan Discussed with: Anesthesiologist, CRNA and Surgeon  Anesthesia Plan Comments: (Per oncology notes by Dr. Alen Blew, "Stage IV bladder cancer documented in June 2020.  He was initially diagnosed with localized disease in 2019. He is status post cystectomy done in July 2019.  Surgery performed in [place]. He is status post lung biopsy completed on June 2 of 2020. Gemcitabine and cisplatin chemotherapy cycle 1 started on 06/02/2019.  He completed 6 cycles of therapy on September 21, 2019.  He had a near complete  response with a residual pulmonary nodule." He has now opted for surgical resection of RUL nodule. 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 Cyndia Bent is aware of. Hgb 9.4 on preop labs 10/26/19, up from 8.2 on 10/23. Remainder of preop labs WNL.   EKG 10/26/19: Sinus bradycardia. Rate 51. Left ventricular hypertrophy. First degree heart block. No significant change since last tracing 10/10/08 )     Anesthesia Quick Evaluation

## 2019-10-29 NOTE — H&P (Signed)
[address]       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  I saw him initially on 04/28/2019 for evaluation of a right upper lobe nodule.  He has a history of high-grade infiltrative bladder cancer that was resected by TURBT with instillation of mitomycin-C by Dr. Jeffie Pollock on 05/10/2018.  He subsequently had a recurrence and went to [place] where he underwent cystectomy with formation of a neobladder in 06/2018.  He had a chest x-ray on 04/10/2019 which showed a new 2.3 cm nodule in the right upper lobe which had not been present on his previous chest x-ray from 10/21/2018. There are calcified granulomata in the right lung that were stable. He subsequently underwent a CT scan of the chest on 04/20/2019. This showed a 2.0 x 2.0 cm right upper lobe solid-appearing pulmonary nodule. There was also a groundglass density measuring 1.2 x 1.2 cm in the right upper lobe which was stable from a previous CT scan dated 06/10/2018.  He underwent a PET scan on 05/09/2019 that showed bilateral hilar lymph nodes that had hypermetabolic activity with an SUV max of 11.69 on the right and 6.48 on the left which was suspicious for hilar lymph node metastasis.  The hypermetabolic hilar lymph nodes do not appear particularly enlarged.  He subsequently underwent CT-guided core needle biopsy of the right upper lobe nodule which showed metastatic urothelial carcinoma.  With this diagnosis and the hypermetabolic bilateral hilar lymphadenopathy I do not feel that he was a candidate for surgical resection at that time and should be evaluated by oncology.  He  subsequently underwent 6 cycles of chemotherapy under the direction of Dr. Alen Blew which finished on September 21, 2019.  He underwent a repeat PET scan on 10/05/2019 which showed a significant reduction in the size and solidity of the right upper lobe pulmonary nodule with a reduction in the maximum SUV to 1.3.  The bilateral hilar hypermetabolic lymph node activity was stable and it was felt that this could be due to granulomatous disease or metastatic disease.  There was diffuse widespread bony activity but no focal bony hypermetabolic lesions identified.  He said that he feels fairly well overall.  His appetite has been good.  He denies any respiratory problems.     Past Medical History:  Diagnosis Date   Bladder cancer ([place]) 05/10/2018   TUR of the BLADDER...DR. Jeffie Pollock   Chest pain    at rest   Hearing loss    right ear   Hepatitis    history of Hep. B   History of hiatal hernia 05/30/2016   Small retrocardiac, noted on CT   Internal hemorrhoids    Liver lesion    Mild cardiomegaly    Scrotal nodule    Bilateral    Past Surgical History:  Procedure Laterality Date   BIOPSY TESTIS     bilateral testicle mass   COLONOSCOPY     CYSTOSCOPY/RETROGRADE/URETEROSCOPY N/A 05/10/2018   Procedure: BILATERAL RETROGRADE;  Surgeon: [name],  MD;  Location: [facility];  Service: Urology;  Laterality: N/A;   IR IMAGING GUIDED PORT INSERTION  05/31/2019   TRANSURETHRAL RESECTION OF BLADDER TUMOR WITH MITOMYCIN-C N/A 05/10/2018   Procedure: CYSTOSCOPY TRANSURETHRAL RESECTION OF BLADDER TUMOR WITH MITOMYCIN-C;  Surgeon: [name], MD;  Location: [facility];  Service: Urology;  Laterality: N/A;   UPPER GI ENDOSCOPY      History reviewed. No pertinent family history.  Social History Social History        Tobacco Use   Smoking status: Former Smoker   Smokeless tobacco: Never Used  Substance Use Topics   Alcohol use: Never    Frequency: Never    Drug use: Never          Current Outpatient Medications  Medication Sig Dispense Refill   aspirin 81 MG chewable tablet Chew 1 tablet (81 mg total) by mouth daily.     lidocaine-prilocaine (EMLA) cream APPLY ONE APPLICATION TOPICALLY AS NEEDED 30 g 0   prochlorperazine (COMPAZINE) 10 MG tablet TAKE ONE TABLET BY MOUTH EVERY SIX HOURS AS NEEDED FOR NAUSEA OR VOMITING 30 tablet 0   promethazine (PHENERGAN) 25 MG tablet Take 1 tablet (25 mg total) by mouth every 6 (six) hours as needed for nausea or vomiting. 50 tablet 0   No current facility-administered medications for this visit.     No Known Allergies  Review of Systems  Constitutional: Negative.   HENT: Negative.   Eyes: Negative.   Respiratory: Negative.   Cardiovascular: Negative.   Gastrointestinal: Negative.   Endocrine: Negative.   Genitourinary: Negative.   Musculoskeletal: Negative.   Allergic/Immunologic: Negative.   Neurological: Negative.   Hematological: Negative.   Psychiatric/Behavioral: Negative.     BP 128/66 (BP Location: Right Arm, Patient Position: Sitting, Cuff Size: Normal)    Pulse 69    Temp 97.7 F (36.5 C)    Resp 16    Ht 5\' 4"  (1.626 m)    Wt 135 lb 12.8 oz (61.6 kg)    SpO2 96% Comment: RA   BMI 23.31 kg/m  Physical Exam Constitutional:      Appearance: Normal appearance. He is normal weight.  HENT:     Head: Normocephalic and atraumatic.     Mouth/Throat:     Mouth: Mucous membranes are moist.     Pharynx: Oropharynx is clear.  Eyes:     Extraocular Movements: Extraocular movements intact.     Conjunctiva/sclera: Conjunctivae normal.     Pupils: Pupils are equal, round, and reactive to light.  Neck:     Musculoskeletal: Normal range of motion and neck supple. No neck rigidity.  Cardiovascular:     Rate and Rhythm: Normal rate and regular rhythm.     Pulses: Normal pulses.     Heart sounds: Normal heart sounds. No murmur.  Pulmonary:     Effort: Pulmonary effort is  normal.     Breath sounds: Normal breath sounds.  Abdominal:     General: Abdomen is flat. Bowel sounds are normal.     Palpations: Abdomen is soft.  Musculoskeletal:        General: No swelling or tenderness.  Lymphadenopathy:     Cervical: No cervical adenopathy.  Skin:    General: Skin is warm and dry.  Neurological:     General: No focal deficit present.     Mental Status: He is alert and oriented to person, place, and time.  Psychiatric:        Mood and Affect:  Mood normal.        Behavior: Behavior normal.      Diagnostic Tests:  CLINICAL DATA: Subsequent treatment strategy for bladder cancer.  EXAM: NUCLEAR MEDICINE PET SKULL BASE TO THIGH  TECHNIQUE: 6.9 mCi F-18 FDG was injected intravenously. Full-ring PET imaging was performed from the skull base to thigh after the radiotracer. CT data was obtained and used for attenuation correction and anatomic localization.  Fasting blood glucose: 105 mg/dl  COMPARISON: 08/01/2019  FINDINGS: Mediastinal blood pool activity: SUV max 2.1  Liver activity: SUV max 3.4  NECK: Symmetric palatine tonsillar activity, likely physiologic.  Incidental CT findings: none  CHEST: 1.9 by 0.8 cm sub solid right upper lobe pulmonary nodule on image 27/8, previously measuring 1.9 by 1.0 cm on 08/01/2019, maximum SUV 1.3, previously 3.2  Densely calcified right lung nodules are not hypermetabolic.  A small hypermetabolic right hilar lymph node has a maximum SUV of 9.1, formerly 8.7. Small left hilar lymph nodes noted with maximum SUV 4.1, formerly 4.8.  Incidental CT findings: None  ABDOMEN/PELVIS: Photopenic hepatic cysts. Excreted FDG in the ureters and neobladder.  Incidental CT findings: Aortoiliac atherosclerotic vascular disease.  SKELETON:Low-grade diffuse uptake, slightly increased from prior, without focal lesion identified. Relative photopenia in the sclerotic region of the L3-4 endplate  sclerosis eccentric to the left.  Incidental CT findings: none  IMPRESSION: 1. Mildly reduced size/solidity of the right upper lobe pulmonary nodule, with reduction in maximum SUV from previous 3.2 to current 1.3, compatible with improvement. 2. Continued roughly stable right greater than left hypermetabolic activity in hilar lymph nodes. These could be from granulomatous process or metastatic disease, continued surveillance suggested. 3. Widespread bony activity, query granulocyte stimulation. No focal bony hypermetabolic lesions identified.   Electronically Signed By: Van Clines M.D. On: 10/05/2019 13:26  Impression:  This 63 year old gentleman has stage IV bladder cancer with a single pulmonary metastasis and mild bilateral hilar hypermetabolic lymphadenopathy.  He completed 6 cycles of chemotherapy with a significant decrease in the size and hypermetabolic activity of the right upper lobe lung nodule.  The lymph node size and hypermetabolic activity in the hila are unchanged.  It is certainly possible that this could be metastatic hilar lymphadenopathy but given the significant improvement in the appearance of the right upper lobe lung nodule I would expect that if the hilar lymph nodes had metastatic cancer they would have also significantly improved.  It may be that this hilar hypermetabolic lymphadenopathy is inflammatory.  There are no other hypermetabolic lesions seen on the PET scan.  I think the options for treatment of the residual right upper lobe nodule are surgical resection versus radiation therapy.  I discussed those options with the patient.  I think surgical resection may decrease the chance of local recurrence but I do not think there is any evidence that it will improve his long-term survival compared to radiation therapy.  The patient is young and doing well clinically and I think he would tolerate surgical resection.  He would rather undergo surgical  resection to remove the lesion and then radiation therapy.  I reviewed the PET scan images with him and answered all of his questions via the interpreter.  I discussed the operative procedure including alternatives, benefits, and risk including but not limited to bleeding, blood transfusion, infection, prolonged air leak, a positive surgical margin possibly requiring further radiation therapy, and recurrence of the cancer at this or other sites.  He understands all this and agrees to proceed with  surgery.  His last hemoglobin was 8.2 on 10/06/2019 and was improving from when it was 6.9 on 09/13/2019.  I would expect this to continue to improve over the next few weeks but there is still a chance that he may require transfusion.  Plan:  Right thoracotomy and wedge resection of the right upper lobe lung nodule    Gaye Pollack, MD [facility]

## 2019-10-30 ENCOUNTER — Inpatient Hospital Stay (HOSPITAL_COMMUNITY): Payer: Medicare Other | Admitting: Certified Registered"

## 2019-10-30 ENCOUNTER — Inpatient Hospital Stay (HOSPITAL_COMMUNITY): Payer: Medicare Other

## 2019-10-30 ENCOUNTER — Other Ambulatory Visit: Payer: Self-pay

## 2019-10-30 ENCOUNTER — Inpatient Hospital Stay (HOSPITAL_COMMUNITY)
Admission: RE | Admit: 2019-10-30 | Discharge: 2019-11-02 | DRG: 164 | Disposition: A | Discharge status: Home / Self Care | Payer: Medicare Other | Attending: Surgery | Admitting: Surgery

## 2019-10-30 ENCOUNTER — Encounter (HOSPITAL_COMMUNITY)
Admission: RE | Disposition: A | Discharge status: Home / Self Care | Payer: Self-pay | Source: Home / Self Care | Attending: Surgery

## 2019-10-30 ENCOUNTER — Encounter (HOSPITAL_COMMUNITY): Payer: Self-pay

## 2019-10-30 DIAGNOSIS — Z9889 Other specified postprocedural states: Secondary | ICD-10-CM

## 2019-10-30 DIAGNOSIS — Z9221 Personal history of antineoplastic chemotherapy: Secondary | ICD-10-CM | POA: Diagnosis not present

## 2019-10-30 DIAGNOSIS — Z87891 Personal history of nicotine dependence: Secondary | ICD-10-CM

## 2019-10-30 DIAGNOSIS — D62 Acute posthemorrhagic anemia: Secondary | ICD-10-CM | POA: Diagnosis not present

## 2019-10-30 DIAGNOSIS — J939 Pneumothorax, unspecified: Secondary | ICD-10-CM | POA: Diagnosis present

## 2019-10-30 DIAGNOSIS — Z8551 Personal history of malignant neoplasm of bladder: Secondary | ICD-10-CM | POA: Diagnosis not present

## 2019-10-30 DIAGNOSIS — C7801 Secondary malignant neoplasm of right lung: Principal | ICD-10-CM | POA: Diagnosis present

## 2019-10-30 DIAGNOSIS — R739 Hyperglycemia, unspecified: Secondary | ICD-10-CM | POA: Diagnosis present

## 2019-10-30 DIAGNOSIS — K7689 Other specified diseases of liver: Secondary | ICD-10-CM | POA: Diagnosis present

## 2019-10-30 DIAGNOSIS — E162 Hypoglycemia, unspecified: Secondary | ICD-10-CM | POA: Diagnosis not present

## 2019-10-30 DIAGNOSIS — R911 Solitary pulmonary nodule: Secondary | ICD-10-CM

## 2019-10-30 DIAGNOSIS — Z09 Encounter for follow-up examination after completed treatment for conditions other than malignant neoplasm: Secondary | ICD-10-CM

## 2019-10-30 HISTORY — PX: THORACOTOMY: SHX5074

## 2019-10-30 HISTORY — PX: WEDGE RESECTION: SHX5070

## 2019-10-30 LAB — CBC
HCT: 29.4 % — ABNORMAL LOW (ref 39.0–52.0)
Hemoglobin: 9.6 g/dL — ABNORMAL LOW (ref 13.0–17.0)
MCH: 32.2 pg (ref 26.0–34.0)
MCHC: 32.7 g/dL (ref 30.0–36.0)
MCV: 98.7 fL (ref 80.0–100.0)
Platelets: 170 10*3/uL (ref 150–400)
RBC: 2.98 MIL/uL — ABNORMAL LOW (ref 4.22–5.81)
RDW: 12.8 % (ref 11.5–15.5)
WBC: 10.6 10*3/uL — ABNORMAL HIGH (ref 4.0–10.5)
nRBC: 0 % (ref 0.0–0.2)

## 2019-10-30 LAB — GLUCOSE, CAPILLARY
Glucose-Capillary: 131 mg/dL — ABNORMAL HIGH (ref 70–99)
Glucose-Capillary: 204 mg/dL — ABNORMAL HIGH (ref 70–99)
Glucose-Capillary: 96 mg/dL (ref 70–99)

## 2019-10-30 SURGERY — THORACOTOMY, MAJOR
Anesthesia: General | Site: Chest | Laterality: Right

## 2019-10-30 MED ORDER — LACTATED RINGERS IV SOLN
INTRAVENOUS | Status: DC | PRN
  Administered 2019-10-30: 07:00:00 via INTRAVENOUS

## 2019-10-30 MED ORDER — PROPOFOL 10 MG/ML IV BOLUS
INTRAVENOUS | Status: AC
  Filled 2019-10-30: qty 20

## 2019-10-30 MED ORDER — MORPHINE SULFATE 2 MG/ML IV SOLN
INTRAVENOUS | Status: AC
  Filled 2019-10-30: qty 30

## 2019-10-30 MED ORDER — ROCURONIUM BROMIDE 10 MG/ML (PF) SYRINGE
PREFILLED_SYRINGE | INTRAVENOUS | Status: AC
  Filled 2019-10-30: qty 10

## 2019-10-30 MED ORDER — MORPHINE SULFATE 2 MG/ML IV SOLN
INTRAVENOUS | Status: DC
  Administered 2019-10-30: 11:00:00 via INTRAVENOUS
  Administered 2019-10-30: 1.5 mg via INTRAVENOUS
  Administered 2019-10-30 (×3): 0 mg via INTRAVENOUS
  Administered 2019-10-31 (×2): 1.5 mg via INTRAVENOUS
  Administered 2019-10-31 – 2019-11-01 (×3): 0 mg via INTRAVENOUS
  Administered 2019-11-01: 1.5 mg via INTRAVENOUS
  Administered 2019-11-01: 0 mg via INTRAVENOUS

## 2019-10-30 MED ORDER — PROPOFOL 10 MG/ML IV BOLUS
INTRAVENOUS | Status: DC | PRN
  Administered 2019-10-30: 30 mg via INTRAVENOUS
  Administered 2019-10-30: 160 mg via INTRAVENOUS

## 2019-10-30 MED ORDER — ACETAMINOPHEN 160 MG/5ML PO SOLN: 1000.0000 mg | Freq: Four times a day (QID) | ORAL | Status: DC

## 2019-10-30 MED ORDER — NALOXONE HCL 0.4 MG/ML IJ SOLN: 0.4000 mg | INTRAMUSCULAR | Status: DC | PRN

## 2019-10-30 MED ORDER — ONDANSETRON HCL 4 MG/2ML IJ SOLN
4.0000 mg | Freq: Four times a day (QID) | INTRAMUSCULAR | Status: DC | PRN
  Administered 2019-10-30 (×2): 4 mg via INTRAVENOUS
  Filled 2019-10-30 (×2): qty 2

## 2019-10-30 MED ORDER — DIPHENHYDRAMINE HCL 50 MG/ML IJ SOLN: 12.5000 mg | Freq: Four times a day (QID) | INTRAMUSCULAR | Status: DC | PRN

## 2019-10-30 MED ORDER — INSULIN ASPART 100 UNIT/ML ~~LOC~~ SOLN
0.0000 [IU] | SUBCUTANEOUS | Status: DC
  Administered 2019-10-30: 2 [IU] via SUBCUTANEOUS
  Administered 2019-10-30: 8 [IU] via SUBCUTANEOUS
  Administered 2019-10-31 (×2): 2 [IU] via SUBCUTANEOUS
  Administered 2019-11-01: 4 [IU] via SUBCUTANEOUS

## 2019-10-30 MED ORDER — FENTANYL CITRATE (PF) 250 MCG/5ML IJ SOLN
INTRAMUSCULAR | Status: AC
  Filled 2019-10-30: qty 5

## 2019-10-30 MED ORDER — ONDANSETRON HCL 4 MG/2ML IJ SOLN: 4.0000 mg | Freq: Four times a day (QID) | INTRAMUSCULAR | Status: DC | PRN

## 2019-10-30 MED ORDER — HEMOSTATIC AGENTS (NO CHARGE) OPTIME
TOPICAL | Status: DC | PRN
  Administered 2019-10-30: 1 via TOPICAL

## 2019-10-30 MED ORDER — PHENYLEPHRINE HCL-NACL 10-0.9 MG/250ML-% IV SOLN: INTRAVENOUS | Status: DC | PRN

## 2019-10-30 MED ORDER — DEXAMETHASONE SODIUM PHOSPHATE 10 MG/ML IJ SOLN
INTRAMUSCULAR | Status: AC
  Filled 2019-10-30: qty 1

## 2019-10-30 MED ORDER — FENTANYL CITRATE (PF) 100 MCG/2ML IJ SOLN
25.0000 ug | INTRAMUSCULAR | Status: DC | PRN
  Administered 2019-10-30: 10:00:00 50 ug via INTRAVENOUS

## 2019-10-30 MED ORDER — ONDANSETRON HCL 4 MG/2ML IJ SOLN
INTRAMUSCULAR | Status: AC
  Filled 2019-10-30: qty 2

## 2019-10-30 MED ORDER — MIDAZOLAM HCL 2 MG/2ML IJ SOLN
INTRAMUSCULAR | Status: AC
  Filled 2019-10-30: qty 2

## 2019-10-30 MED ORDER — LIDOCAINE 2% (20 MG/ML) 5 ML SYRINGE
INTRAMUSCULAR | Status: AC
  Filled 2019-10-30: qty 5

## 2019-10-30 MED ORDER — ROCURONIUM BROMIDE 10 MG/ML (PF) SYRINGE
PREFILLED_SYRINGE | INTRAVENOUS | Status: DC | PRN
  Administered 2019-10-30: 60 mg via INTRAVENOUS

## 2019-10-30 MED ORDER — SODIUM CHLORIDE 0.9 % IV SOLN: INTRAVENOUS | Status: DC | PRN

## 2019-10-30 MED ORDER — LIDOCAINE 2% (20 MG/ML) 5 ML SYRINGE
INTRAMUSCULAR | Status: DC | PRN
  Administered 2019-10-30: 60 mg via INTRAVENOUS

## 2019-10-30 MED ORDER — ONDANSETRON HCL 4 MG/2ML IJ SOLN
INTRAMUSCULAR | Status: DC | PRN
  Administered 2019-10-30: 4 mg via INTRAVENOUS

## 2019-10-30 MED ORDER — 0.9 % SODIUM CHLORIDE (POUR BTL) OPTIME
TOPICAL | Status: DC | PRN
  Administered 2019-10-30: 2000 mL

## 2019-10-30 MED ORDER — FENTANYL CITRATE (PF) 100 MCG/2ML IJ SOLN
INTRAMUSCULAR | Status: AC
  Filled 2019-10-30: qty 2

## 2019-10-30 MED ORDER — CEFAZOLIN SODIUM-DEXTROSE 2-4 GM/100ML-% IV SOLN
2.0000 g | Freq: Three times a day (TID) | INTRAVENOUS | Status: AC
  Administered 2019-10-30 (×2): 2 g via INTRAVENOUS
  Filled 2019-10-30 (×2): qty 100

## 2019-10-30 MED ORDER — DEXAMETHASONE SODIUM PHOSPHATE 10 MG/ML IJ SOLN
INTRAMUSCULAR | Status: DC | PRN
  Administered 2019-10-30: 10 mg via INTRAVENOUS

## 2019-10-30 MED ORDER — DEXTROSE-NACL 5-0.9 % IV SOLN
INTRAVENOUS | Status: DC
  Administered 2019-10-30 – 2019-11-01 (×3): via INTRAVENOUS

## 2019-10-30 MED ORDER — DIPHENHYDRAMINE HCL 12.5 MG/5ML PO ELIX
12.5000 mg | ORAL_SOLUTION | Freq: Four times a day (QID) | ORAL | Status: DC | PRN
  Filled 2019-10-30: qty 5

## 2019-10-30 MED ORDER — CEFAZOLIN SODIUM-DEXTROSE 2-4 GM/100ML-% IV SOLN
2.0000 g | INTRAVENOUS | Status: AC
  Administered 2019-10-30: 2 g via INTRAVENOUS
  Filled 2019-10-30: qty 100

## 2019-10-30 MED ORDER — BISACODYL 5 MG PO TBEC
10.0000 mg | DELAYED_RELEASE_TABLET | Freq: Every day | ORAL | Status: DC
  Administered 2019-10-30 – 2019-11-01 (×3): 10 mg via ORAL
  Filled 2019-10-30 (×3): qty 2

## 2019-10-30 MED ORDER — SODIUM CHLORIDE 0.9% FLUSH: 9.0000 mL | INTRAVENOUS | Status: DC | PRN

## 2019-10-30 MED ORDER — OXYCODONE HCL 5 MG PO TABS: 5.0000 mg | ORAL_TABLET | ORAL | Status: DC | PRN

## 2019-10-30 MED ORDER — SUGAMMADEX SODIUM 200 MG/2ML IV SOLN
INTRAVENOUS | Status: DC | PRN
  Administered 2019-10-30: 120 mg via INTRAVENOUS

## 2019-10-30 MED ORDER — SENNOSIDES-DOCUSATE SODIUM 8.6-50 MG PO TABS
1.0000 | ORAL_TABLET | Freq: Every day | ORAL | Status: DC
  Administered 2019-10-30 – 2019-10-31 (×2): 1 via ORAL
  Filled 2019-10-30 (×3): qty 1

## 2019-10-30 MED ORDER — FENTANYL CITRATE (PF) 100 MCG/2ML IJ SOLN
INTRAMUSCULAR | Status: DC | PRN
  Administered 2019-10-30 (×2): 25 ug via INTRAVENOUS
  Administered 2019-10-30: 50 ug via INTRAVENOUS
  Administered 2019-10-30 (×2): 25 ug via INTRAVENOUS
  Administered 2019-10-30: 100 ug via INTRAVENOUS
  Administered 2019-10-30: 75 ug via INTRAVENOUS

## 2019-10-30 MED ORDER — ACETAMINOPHEN 10 MG/ML IV SOLN
INTRAVENOUS | Status: DC | PRN
  Administered 2019-10-30: 1000 mg via INTRAVENOUS

## 2019-10-30 MED ORDER — MIDAZOLAM HCL 5 MG/5ML IJ SOLN
INTRAMUSCULAR | Status: DC | PRN
  Administered 2019-10-30: 2 mg via INTRAVENOUS

## 2019-10-30 MED ORDER — TRAMADOL HCL 50 MG PO TABS: 50.0000 mg | ORAL_TABLET | Freq: Four times a day (QID) | ORAL | Status: DC | PRN

## 2019-10-30 MED ORDER — BUPIVACAINE HCL (PF) 0.5 % IJ SOLN
INTRAMUSCULAR | Status: AC
  Filled 2019-10-30: qty 60

## 2019-10-30 MED ORDER — ACETAMINOPHEN 500 MG PO TABS
1000.0000 mg | ORAL_TABLET | Freq: Four times a day (QID) | ORAL | Status: DC
  Administered 2019-10-30 – 2019-10-31 (×5): 1000 mg via ORAL
  Filled 2019-10-30 (×7): qty 2

## 2019-10-30 SURGICAL SUPPLY — 81 items
BENZOIN TINCTURE PRP APPL 2/3 (GAUZE/BANDAGES/DRESSINGS) IMPLANT
BLADE CLIPPER SURG (BLADE) ×3 IMPLANT
CANISTER SUCT 3000ML PPV (MISCELLANEOUS) ×6 IMPLANT
CATH KIT ON-Q SILVERSOAK 5IN (CATHETERS) IMPLANT
CATH THORACIC 28FR (CATHETERS) ×3 IMPLANT
CATH THORACIC 36FR (CATHETERS) IMPLANT
CATH THORACIC 36FR RT ANG (CATHETERS) IMPLANT
CLIP VESOCCLUDE MED 6/CT (CLIP) ×3 IMPLANT
CONN ST 1/4X3/8  BEN (MISCELLANEOUS)
CONN ST 1/4X3/8 BEN (MISCELLANEOUS) IMPLANT
CONN Y 3/8X3/8X3/8  BEN (MISCELLANEOUS)
CONN Y 3/8X3/8X3/8 BEN (MISCELLANEOUS) IMPLANT
CONT SPEC 4OZ CLIKSEAL STRL BL (MISCELLANEOUS) ×6 IMPLANT
COVER SURGICAL LIGHT HANDLE (MISCELLANEOUS) ×6 IMPLANT
COVER WAND RF STERILE (DRAPES) ×3 IMPLANT
DERMABOND ADVANCED (GAUZE/BANDAGES/DRESSINGS) ×2
DERMABOND ADVANCED .7 DNX12 (GAUZE/BANDAGES/DRESSINGS) ×1 IMPLANT
DRAIN CHANNEL 28F RND 3/8 FF (WOUND CARE) IMPLANT
DRAIN CHANNEL 32F RND 10.7 FF (WOUND CARE) IMPLANT
DRAPE LAPAROSCOPIC ABDOMINAL (DRAPES) ×3 IMPLANT
DRAPE WARM FLUID 44X44 (DRAPES) ×3 IMPLANT
ELECT BLADE 6.5 EXT (BLADE) ×3 IMPLANT
ELECT REM PT RETURN 9FT ADLT (ELECTROSURGICAL) ×3
ELECTRODE REM PT RTRN 9FT ADLT (ELECTROSURGICAL) ×1 IMPLANT
GAUZE SPONGE 4X4 12PLY STRL (GAUZE/BANDAGES/DRESSINGS) ×3 IMPLANT
GLOVE BIO SURGEON STRL SZ 6.5 (GLOVE) ×4 IMPLANT
GLOVE BIO SURGEON STRL SZ7 (GLOVE) ×3 IMPLANT
GLOVE BIO SURGEONS STRL SZ 6.5 (GLOVE) ×2
GLOVE BIOGEL PI IND STRL 6.5 (GLOVE) ×1 IMPLANT
GLOVE BIOGEL PI IND STRL 7.0 (GLOVE) ×1 IMPLANT
GLOVE BIOGEL PI INDICATOR 6.5 (GLOVE) ×2
GLOVE BIOGEL PI INDICATOR 7.0 (GLOVE) ×2
GLOVE EUDERMIC 7 POWDERFREE (GLOVE) ×6 IMPLANT
GLOVE SURG SS PI 7.5 STRL IVOR (GLOVE) ×3 IMPLANT
GOWN STRL REUS W/ TWL LRG LVL3 (GOWN DISPOSABLE) ×4 IMPLANT
GOWN STRL REUS W/ TWL XL LVL3 (GOWN DISPOSABLE) ×1 IMPLANT
GOWN STRL REUS W/TWL LRG LVL3 (GOWN DISPOSABLE) ×8
GOWN STRL REUS W/TWL XL LVL3 (GOWN DISPOSABLE) ×2
HANDLE STAPLE ENDO GIA SHORT (STAPLE) ×2
KIT BASIN OR (CUSTOM PROCEDURE TRAY) ×3 IMPLANT
KIT TURNOVER KIT B (KITS) ×3 IMPLANT
NS IRRIG 1000ML POUR BTL (IV SOLUTION) ×12 IMPLANT
PACK CHEST (CUSTOM PROCEDURE TRAY) ×3 IMPLANT
PAD ARMBOARD 7.5X6 YLW CONV (MISCELLANEOUS) ×6 IMPLANT
PASSER SUT SWANSON 36MM LOOP (INSTRUMENTS) ×6 IMPLANT
RELOAD EGIA 45 MED/THCK PURPLE (STAPLE) ×3 IMPLANT
RELOAD EGIA 60 MED/THCK PURPLE (STAPLE) ×6 IMPLANT
RELOAD TRI 2.0 60 XTHK VAS SUL (STAPLE) ×3 IMPLANT
SEALANT PROGEL (MISCELLANEOUS) ×3 IMPLANT
SEALANT SURG COSEAL 4ML (VASCULAR PRODUCTS) IMPLANT
SEALANT SURG COSEAL 8ML (VASCULAR PRODUCTS) IMPLANT
SOL ANTI FOG 6CC (MISCELLANEOUS) ×1 IMPLANT
SOLUTION ANTI FOG 6CC (MISCELLANEOUS) ×2
STAPLER ENDO GIA 12MM SHORT (STAPLE) ×1 IMPLANT
SUT PROLENE 3 0 SH DA (SUTURE) IMPLANT
SUT SILK  1 MH (SUTURE) ×4
SUT SILK 1 MH (SUTURE) ×2 IMPLANT
SUT SILK 1 TIES 10X30 (SUTURE) IMPLANT
SUT SILK 2 0 SH (SUTURE) ×6 IMPLANT
SUT SILK 2 0SH CR/8 30 (SUTURE) IMPLANT
SUT SILK 3 0SH CR/8 30 (SUTURE) IMPLANT
SUT VIC AB 1 CTX 36 (SUTURE) ×2
SUT VIC AB 1 CTX36XBRD ANBCTR (SUTURE) ×1 IMPLANT
SUT VIC AB 2-0 CT1 27 (SUTURE)
SUT VIC AB 2-0 CT1 TAPERPNT 27 (SUTURE) IMPLANT
SUT VIC AB 2-0 CTX 36 (SUTURE) ×3 IMPLANT
SUT VIC AB 2-0 UR6 27 (SUTURE) IMPLANT
SUT VIC AB 3-0 MH 27 (SUTURE) IMPLANT
SUT VIC AB 3-0 SH 27 (SUTURE) ×2
SUT VIC AB 3-0 SH 27X BRD (SUTURE) ×1 IMPLANT
SUT VIC AB 3-0 X1 27 (SUTURE) ×3 IMPLANT
SUT VICRYL 2 TP 1 (SUTURE) ×3 IMPLANT
SYSTEM SAHARA CHEST DRAIN ATS (WOUND CARE) ×3 IMPLANT
TAPE CLOTH SURG 4X10 WHT LF (GAUZE/BANDAGES/DRESSINGS) ×3 IMPLANT
TIP APPLICATOR SPRAY EXTEND 16 (VASCULAR PRODUCTS) IMPLANT
TOWEL GREEN STERILE (TOWEL DISPOSABLE) ×3 IMPLANT
TOWEL GREEN STERILE FF (TOWEL DISPOSABLE) ×3 IMPLANT
TRAP SPECIMEN MUCOUS 40CC (MISCELLANEOUS) IMPLANT
TRAY FOLEY MTR SLVR 14FR STAT (SET/KITS/TRAYS/PACK) ×3 IMPLANT
TUNNELER SHEATH ON-Q 11GX8 DSP (PAIN MANAGEMENT) IMPLANT
WATER STERILE IRR 1000ML POUR (IV SOLUTION) ×6 IMPLANT

## 2019-10-30 NOTE — Progress Notes (Signed)
Pt admitted from PACU, with R chest tube in place ~30cc output. Place CT -20 to suction. Pt c/p of 5/10 pain. Encourage PCA pump use. VS stable, afebrile. Will continue to monitor.

## 2019-10-30 NOTE — Anesthesia Procedure Notes (Signed)
Arterial Line Insertion Start/End11/16/2020 6:40 AM, 10/30/2019 6:55 AM Performed by: Belinda Block, MD, Cleda Daub, CRNA, CRNA  Patient location: Pre-op. Preanesthetic checklist: patient identified, IV checked, site marked, risks and benefits discussed, surgical consent, monitors and equipment checked, pre-op evaluation, timeout performed and anesthesia consent Lidocaine 1% used for infiltration Left, radial was placed Catheter size: 20 Fr Hand hygiene performed  and maximum sterile barriers used   Attempts: 1 Procedure performed without using ultrasound guided technique. Following insertion, dressing applied and Biopatch. Post procedure assessment: normal and unchanged  Patient tolerated the procedure well with no immediate complications. Additional procedure comments: Arterial Line done by Orma Flaming, SRNA.

## 2019-10-30 NOTE — Interval H&P Note (Signed)
History and Physical Interval Note:  10/30/2019 7:35 AM  Robert Lowery  has presented today for surgery, with the diagnosis of RUL LUNG NODULE.  The various methods of treatment have been discussed with the patient and family. After consideration of risks, benefits and other options for treatment, the patient has consented to  Procedure(s): THORACOTOMY MAJOR (Right) WEDGE RESECTION (Right) as a surgical intervention.  The patient's history has been reviewed, patient examined, no change in status, stable for surgery.  I have reviewed the patient's chart and labs.  Questions were answered to the patient's satisfaction.     Gaye Pollack

## 2019-10-30 NOTE — Plan of Care (Signed)

## 2019-10-30 NOTE — Anesthesia Procedure Notes (Signed)
Procedure Name: Intubation Date/Time: 10/30/2019 7:45 AM Performed by: Cleda Daub, CRNA Pre-anesthesia Checklist: Patient identified, Emergency Drugs available, Suction available and Patient being monitored Patient Re-evaluated:Patient Re-evaluated prior to induction Oxygen Delivery Method: Circle system utilized Preoxygenation: Pre-oxygenation with 100% oxygen Induction Type: IV induction Ventilation: Mask ventilation without difficulty Laryngoscope Size: Mac and 3 Grade View: Grade I Tube type: Oral Endobronchial tube: Left, Double lumen EBT, EBT position confirmed by fiberoptic bronchoscope and EBT position confirmed by auscultation and 37 Fr Number of attempts: 1 Airway Equipment and Method: Stylet and Fiberoptic brochoscope Placement Confirmation: ETT inserted through vocal cords under direct vision,  positive ETCO2 and breath sounds checked- equal and bilateral Tube secured with: Tape Dental Injury: Teeth and Oropharynx as per pre-operative assessment  Comments: Intubated by Orma Flaming, SRNA

## 2019-10-30 NOTE — Transfer of Care (Signed)
Immediate Anesthesia Transfer of Care Note  Patient: Robert Lowery  Procedure(s) Performed: THORACOTOMY MAJOR (Right Chest) WEDGE RESECTION (Right Chest)  Patient Location: PACU  Anesthesia Type:General  Level of Consciousness: awake, alert , oriented and patient cooperative  Airway & Oxygen Therapy: Patient Spontanous Breathing and Patient connected to face mask oxygen  Post-op Assessment: Report given to RN and Post -op Vital signs reviewed and stable  Post vital signs: Reviewed and stable  Last Vitals:  Vitals Value Taken Time  BP 133/75 10/30/19 0940  Temp    Pulse 68 10/30/19 0943  Resp 13 10/30/19 0943  SpO2 100 % 10/30/19 0943  Vitals shown include unvalidated device data.  Last Pain:  Vitals:   10/30/19 0604  TempSrc:   PainSc: 0-No pain         Complications: No apparent anesthesia complications

## 2019-10-30 NOTE — Discharge Summary (Signed)
Physician Discharge Summary  Patient ID: Robert Lowery MRN: [id number] DOB/AGE: Mar 31, 1956 63 y.o.  Admit date: 10/30/2019 Discharge date: 11/02/2019  Admission Diagnoses:  Right pulmonary nodule History of bladder cancer  Discharge Diagnoses:   Right pulmonary nodule History of bladder cancer S/P thoracotomy, Right upper lobe wedge resection of                      pulmonary nodule   Discharged Condition: stable  History of Present Illness: The patient is a 63 year old [place] gentleman who is here today with a [place] interpreter. I saw him initially on 04/28/2019 for evaluation of a right upper lobe nodule. He has a history of high-grade infiltrative bladder cancer that was resected by TURBT with instillation of mitomycin-C by Dr. Jeffie Lowery on 05/10/2018. He subsequently had a recurrence and went to [place] where he underwent cystectomy with formation of a neobladder in 06/2018.He had a chest x-ray on 04/10/2019 which showed a new 2.3 cm nodule in the right upper lobe which had not been present on his previous chest x-ray from 10/21/2018. There are calcified granulomata in the right lung that were stable. He subsequently underwent a CT scan of the chest on 04/20/2019. This showed a 2.0 x 2.0 cm right upper lobe solid-appearing pulmonary nodule. Therewasalso a groundglass density measuring 1.2 x 1.2 cm in the right upper lobe which was stable from a previous CT scan dated 06/10/2018.He underwent a PET scan on 05/09/2019 that showed bilateral hilar lymph nodes that had hypermetabolic activity with an SUV max of 11.69 on the right and 6.48 on the left which was suspicious for hilar lymph node metastasis. The hypermetabolic hilar lymph nodes do not appear particularly enlarged. He subsequentlyunderwent CT-guided core needle biopsy of the right upper lobe nodule which showed metastatic urothelial carcinoma. With this diagnosis and the hypermetabolic bilateral hilar lymphadenopathy I did not  feel that he was a candidate for surgical resection at that time and should be evaluated by oncology. He subsequently underwent 6 cycles of chemotherapy under the direction of Dr. Alen Lowery which finished on September 21, 2019. He underwent a repeat PET scan on 10/05/2019 which showed a significant reduction in the size and solidity of the right upper lobe pulmonary nodule with a reduction in the maximum SUV to 1.3. The bilateral hilar hypermetabolic lymph node activity was stable and it was felt that this could be due to granulomatous disease or metastatic disease. There was diffuse widespread bony activity but no focal bony hypermetabolic lesions identified.  He said that he feels fairly well overall. His appetite has been good. He denies any respiratory problems.   Hospital Course:   Robert Lowery was admitted to the hospital for same-day surgery and taken to the operating room where right muscle-sparing thoracotomy was performed with wedge resection of the right upper lobe lesion.  Following the procedure, he was recovered in the postanesthesia care unit.  Immediate postop chest x-ray was satisfactory.  There was a small lateral pneumothorax.  Patient was transferred to [facility], progressive care.  He remained stable from a respiratory standpoint. There was no significant air leak postoperatively so the chest tube was placed to water seal on the first post-op day. Follow-up chest x-ray was satisfactory so the chest tube was removed on the following day.  Repeat chest x-ray was obtained after the tube was removed that showed no pneumothorax.  At this point, patient's pain was being controlled with oral analgesics.  He was felt to  be ready for discharge home.  The pathology report showed matestatic carcinoma in the lung nodule c/w the patient's history of  urothelial carcinoma.  The resection margin had a microscopic focus of metestatic carcinoma. The findings were discussed with the patient by Dr.  Cyndia Lowery.  Consults: None  Significant Diagnostic Studies:   SURGICAL PATHOLOGY  CASE: [id number]  PATIENT: Robert Lowery  Surgical Pathology Report   Clinical History: RUL lung nodule (cm)   FINAL MICROSCOPIC DIAGNOSIS:   A. 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 See comment    COMMENT:   The focus of carcinoma at the margin is grossly away from the larger  tumor nodule and likely represents a separate microscopic focus of  metastatic carcinoma.      GROSS DESCRIPTION:   Specimen: Lung, right upper lobe wedge resection, received fresh  Specimen integrity: Intact  Size, weight: 10 g, 9.2 x 3.4 x 2 cm  Pleura: Pink-red to slightly anthracotic, with a 1 cm in diameter  slightly umbilicated area. 1 staple line on 1 aspect.  Lesion: On sectioning through the area of umbilicated pleura, there is a  1.2 x 1.2 x 1.1 cm indurated, ill-defined area of pale yellow-pink to  anthracotic tissue, which has a central 0.8 cm in length and 0.3 cm  diameter cavitary area containing clear gelatinous material.  Margin(s): The area of indurated, ill-defined tissue is 0.8 cm from the  nearest staple line.  Block Summary:  Blocks 1-3 = area of ill-defined, indurated tissue, entirely submitted  Block 4 = tissue adjacent to stapled margin nearest ill-defined,  indurated tissue  SW 10/30/2019      Final Diagnosis performed by Robert Folds, MD.  Electronically  signed 10/31/2019  Technical and / or Professional components performed at [facility]. [facility], [place] [address] [place], [place] [address].  Immunohistochemistry Technical component (if applicable) was performed  at [facility]. [address] [place],  [place], [place]  [address].  IMMUNOHISTOCHEMISTRY DISCLAIMER (if applicable):  Some of these immunohistochemical stains may have been developed and the  performance characteristics determine by [facility]. Some  may not have been cleared or approved by the U.S. Food and Drug  Administration. The FDA has determined that such clearance or approval  is not necessary. This test is used for clinical purposes. It should not  be regarded as investigational or for research. This laboratory is  certified under the [place]  (CLIA-88) as qualified to perform high complexity clinical laboratory  testing. The controls stained appropriately.     EXAM: NUCLEAR MEDICINE PET SKULL BASE TO THIGH  TECHNIQUE: 6.9 mCi F-18 FDG was injected intravenously. Full-ring PET imaging was performed from the skull base to thigh after the radiotracer. CT data was obtained and used for attenuation correction and anatomic localization.  Fasting blood glucose: 105 mg/dl  COMPARISON:  08/01/2019  FINDINGS: Mediastinal blood pool activity: SUV max 2.1  Liver activity: SUV max 3.4  NECK: Symmetric palatine tonsillar activity, likely physiologic.  Incidental CT findings: none  CHEST: 1.9 by 0.8 cm sub solid right upper lobe pulmonary nodule on image 27/8, previously measuring 1.9 by 1.0 cm on 08/01/2019, maximum SUV 1.3, previously 3.2  Densely calcified right lung nodules are not hypermetabolic.  A small hypermetabolic right hilar lymph node has a maximum SUV of 9.1, formerly 8.7. Small left hilar lymph nodes noted with maximum SUV 4.1, formerly 4.8.  Incidental CT findings: None  ABDOMEN/PELVIS: Photopenic hepatic cysts. Excreted FDG in the ureters and neobladder.  Incidental CT findings: Aortoiliac atherosclerotic vascular disease.  SKELETON:Low-grade diffuse uptake, slightly increased from prior, without focal lesion identified. Relative  photopenia in the sclerotic region of the L3-4 endplate sclerosis eccentric to the left.  Incidental CT findings: none  IMPRESSION: 1. Mildly reduced size/solidity of the right upper lobe pulmonary nodule, with reduction in maximum SUV from previous 3.2 to current 1.3, compatible with improvement. 2. Continued roughly stable right greater than left hypermetabolic activity in hilar lymph nodes. These could be from granulomatous process or metastatic disease, continued surveillance suggested. 3. Widespread bony activity, query granulocyte stimulation. No focal bony hypermetabolic lesions identified.   Electronically Signed   By: Van Clines M.D.   On: 10/05/2019 13:26   EXAM: CHEST - 2 VIEW   (post chest tube removal)  COMPARISON:  November 01, 2019.  FINDINGS: The heart size and mediastinal contours are within normal limits. Right-sided chest tube has been removed without pneumothorax. Left lung is clear. Right internal jugular Port-A-Cath is unchanged in position. Postsurgical changes are noted in the right upper lobe. Mild subcutaneous emphysema is seen over right lateral chest wall. No significant pleural effusion is noted. The visualized skeletal structures are unremarkable.  IMPRESSION: Right-sided chest tube has been removed without pneumothorax. Stable postsurgical changes seen in right upper lobe.   Electronically Signed   By: Marijo Conception M.D.   On: 11/02/2019 07:50    Treatments:   CARDIOTHORACIC SURGERY OPERATIVE NOTE  10/30/2019 [facility] [id number]  Surgeon:  Gaye Pollack, MD  First Assistant: Enid Cutter, PA-C   Preoperative Diagnosis:  Residual right upper lobe nodule s/p chemotherapy for stage IV urothelial carcinoma   Postoperative Diagnosis: same  Procedure:  1. Right muscle-sparing thoracotomy 2. Wedge resection of right upper lobe lung nodule  Anesthesia:  General  Endotracheal   Clinical History/Surgical Indication:  This 63 year old gentleman has stage IV bladder cancer with a single pulmonary metastasis and mild bilateral hilar hypermetabolic lymphadenopathy. He completed 6 cycles of chemotherapy with a significant decrease in the size and hypermetabolic activity of the right upper lobe lung nodule. The lymph node size and hypermetabolic activity in the hila are unchanged. It is certainly possible that this could be metastatic hilar lymphadenopathy but given the significant improvement in the appearance of the right upper lobe lung nodule I would expect that if the hilar lymph nodes had metastatic cancer they would have also significantly improved. It may be that this hilar hypermetabolic lymphadenopathy is inflammatory. There are no other hypermetabolic lesions seen on the PET scan. I think the options for treatment of the residual right upper lobe nodule are surgical resection versus radiation therapy. I discussed those options with the patient. I think surgical resection may decrease the chance of local recurrence but I do not think there is any evidence that it will improve his long-term survival compared to radiation therapy. The patient is young and doing well clinically and I think he would tolerate surgical resection. He would rather undergo surgical resection to remove the lesion and then radiation therapy. I reviewed the PET scan images with him and answered all of his questions via the interpreter. I discussed the operative procedure including alternatives, benefits, and risk including but not limited  to bleeding, blood transfusion, infection, prolonged air leak, a positive surgical margin possibly requiring further radiation therapy, and recurrence of the cancer at this or other sites. He understands all this and agrees to proceed with surgery.   Discharge Exam: Blood pressure 130/65, pulse (!) 50, temperature 98.1 F (36.7 C),  temperature source Oral, resp. rate 12, height 5\' 4"  (1.626 m), weight 59 kg, SpO2 96 %.  Physical Exam: General appearance:alert, cooperative and no distress Neurologic:intact Heart:SR~60/min. Lungs:clear to auscultation bilaterally. CXR reviewed and shows no PTX, minor right lateral subQ airis resolving.  Wound:The chest tube insertion siteis covered with a dry, intact dressing. The thoracotomy incision is open to air and is intact, clean, and dry.   Disposition:    Allergies as of 11/02/2019   No Known Allergies     Medication List    TAKE these medications   aspirin 81 MG chewable tablet Chew 1 tablet (81 mg total) by mouth daily.   cholecalciferol 25 MCG (1000 UT) tablet Commonly known as: VITAMIN D3 Take 1,000 Units by mouth daily.   CINNAMON PO Take 1,000 mg by mouth daily.   lansoprazole 15 MG capsule Commonly known as: PREVACID Take 15 mg by mouth daily before breakfast.   lidocaine-prilocaine cream Commonly known as: EMLA APPLY ONE APPLICATION TOPICALLY AS NEEDED   multivitamin with minerals Tabs tablet Take 1 tablet by mouth daily.   prochlorperazine 10 MG tablet Commonly known as: COMPAZINE TAKE ONE TABLET BY MOUTH EVERY SIX HOURS AS NEEDED FOR NAUSEA OR VOMITING   promethazine 25 MG tablet Commonly known as: PHENERGAN Take 1 tablet (25 mg total) by mouth every 6 (six) hours as needed for nausea or vomiting.   traMADol 50 MG tablet Commonly known as: ULTRAM Take 1 tablet (50 mg total) by mouth every 6 (six) hours as needed for up to 5 days (mild pain).   vitamin C 1000 MG tablet Take 1,000 mg by mouth daily.      Follow-up Information    Gaye Pollack, MD. Go on 11/15/2019.   Specialty: Cardiothoracic Surgery Why: You have a follow up appointment with Dr. Cyndia Lowery on Wednesday 11/15/19 at 11:30am. Please arrive 30 minutes early for a chest x-ray to be performed by [facility] located on the first floor of the same building.   Contact information: [address] [place] [address] [phone]        Wyatt Portela, MD. Go on 01/02/2020.   Specialty: Oncology Why: You have a follow up appointment with Dr. Alen Lowery on Tuesday, 01/02/20 at 9:30am.  Contact information: [place] [address] [phone]           Signed: Antony Odea, PA-C 11/02/2019, 8:15 AM

## 2019-10-30 NOTE — Brief Op Note (Signed)
10/30/2019  9:20 AM  PATIENT:  Robert Lowery  63 y.o. male  PRE-OPERATIVE DIAGNOSIS:  RUL LUNG NODULE  POST-OPERATIVE DIAGNOSIS:  Right Upper Lobe Lung Nodule.  PROCEDURE:  Procedure(s): THORACOTOMY MAJOR (Right) WEDGE RESECTION (Right)  SURGEON:  Surgeon(s) and Role:    * Baylin Cabal, Fernande Boyden, MD - Primary  PHYSICIAN ASSISTANT: Enid Cutter, PA-C   ANESTHESIA:   general  EBL:  20 mL   BLOOD ADMINISTERED:none  DRAINS: one 37 F chest tube in the right pleural space   LOCAL MEDICATIONS USED:  NONE  SPECIMEN:  Source of Specimen:  wedge resection of nodule RUL lung  DISPOSITION OF SPECIMEN:  PATHOLOGY  COUNTS:  YES  TOURNIQUET:  * No tourniquets in log *  DICTATION: .Note written in EPIC  PLAN OF CARE: Admit to inpatient   PATIENT DISPOSITION:  PACU - hemodynamically stable.   Delay start of Pharmacological VTE agent (>24hrs) due to surgical blood loss or risk of bleeding: yes

## 2019-10-30 NOTE — Discharge Instructions (Signed)

## 2019-10-30 NOTE — Op Note (Signed)
CARDIOTHORACIC SURGERY OPERATIVE NOTE  10/30/2019 [facility] [id number]  Surgeon:  Gaye Pollack, MD  First Assistant: Enid Cutter, PA-C   Preoperative Diagnosis:  Residual right upper lobe nodule s/p chemotherapy for stage IV urothelial carcinoma   Postoperative Diagnosis: same  Procedure:  1. Right muscle-sparing thoracotomy 2. Wedge resection of right upper lobe lung nodule  Anesthesia:  General Endotracheal   Clinical History/Surgical Indication:  This 63 year old gentleman has stage IV bladder cancer with a single pulmonary metastasis and mild bilateral hilar hypermetabolic lymphadenopathy. He completed 6 cycles of chemotherapy with a significant decrease in the size and hypermetabolic activity of the right upper lobe lung nodule. The lymph node size and hypermetabolic activity in the hila are unchanged. It is certainly possible that this could be metastatic hilar lymphadenopathy but given the significant improvement in the appearance of the right upper lobe lung nodule I would expect that if the hilar lymph nodes had metastatic cancer they would have also significantly improved. It may be that this hilar hypermetabolic lymphadenopathy is inflammatory. There are no other hypermetabolic lesions seen on the PET scan. I think the options for treatment of the residual right upper lobe nodule are surgical resection versus radiation therapy. I discussed those options with the patient. I think surgical resection may decrease the chance of local recurrence but I do not think there is any evidence that it will improve his long-term survival compared to radiation therapy. The patient is young and doing well clinically and I think he would tolerate surgical resection. He would rather undergo surgical resection to remove the lesion and then radiation therapy. I reviewed the PET scan images with him and answered all of his questions via the interpreter. I discussed the operative  procedure including alternatives, benefits, and risk including but not limited to bleeding, blood transfusion, infection, prolonged air leak, a positive surgical margin possibly requiring further radiation therapy, and recurrence of the cancer at this or other sites. He understands all this and agrees to proceed with surgery.  Preparation:  The patient was seen in the preoperative holding area and the correct patient, correct operation, correct operative sidewere confirmed with the patient after reviewing the medical record and CT scan. The consent was signed by me. Preoperative antibiotics were given.  The patient was taken back to the operating room and positioned supine on the operating room table. After being placed under general endotracheal anesthesia by the anesthesia team using a double lumen tube a foley catheter was placed. The patient was turned into the left lateral decubitus position. The right chest was prepped with betadine soap and solution.  A surgical time-out was taken and the correct patient,operative side, and operative procedure were confirmed with the nursing and anesthesia staff.   Operative Procedure:   A short lateral muscle-sparing thoracotomy was performed. The pleural space was entered through the 4th intercostal space. The right upper lobe was examined and the nodule seen with umbilication of the visceral pleural surface over it. There were no other visible abnormalities of the lung or pleural space. This lesion was removed by wedge resection using surgical staplers taking as wide a margin as possible. It was sent to pathology for permanent examination of the lesion and the margin. I did not do a frozen section because I could not have taken any wider margin without performing lobectomy which I would not have done in this gentleman with stage IV cancer. The staple line was coated with Progel to seal any air leaks.  A 28 F chest tube was placed through a small  incision and  advanced posteriorly to the apex. The ribs were re-approximated using #2 vicryl peri-costal sutures that were placed through the middle of the 5th rib after drilling small holes and then around the 4th rib. The muscles were returned to their normal position.  The subcutaneous tissue was closed with 2-0 vicryl continuous suture. The skin was closed with 3-0 vicryl subcuticular suture. All sponge, needle, and instrument counts were reported correct at the end of the case. Dry sterile dressings were placed over the incisions and around the chest tubes which were connected to pleurevac suction. The patient was turned supine, extubated,then transported to the PACU in satisfactory and stable condition.

## 2019-10-30 NOTE — Progress Notes (Signed)
Patient ID: Robert Lowery, male   DOB: [birth year], 63 y.o.   MRN: [id number] TCTS:  Hemodynamically stable after surgery today.  Pain under good control.  CT output low, no air leak.  Wants foley out.  CBC    Component Value Date/Time   WBC 10.6 (H) 10/30/2019 1302   RBC 2.98 (L) 10/30/2019 1302   HGB 9.6 (L) 10/30/2019 1302   HGB 8.2 (L) 10/06/2019 0918   HCT 29.4 (L) 10/30/2019 1302   PLT 170 10/30/2019 1302   PLT 238 10/06/2019 0918   MCV 98.7 10/30/2019 1302   MCH 32.2 10/30/2019 1302   MCHC 32.7 10/30/2019 1302   RDW 12.8 10/30/2019 1302   LYMPHSABS 1.5 10/06/2019 0918   MONOABS 0.5 10/06/2019 0918   EOSABS 0.1 10/06/2019 0918   BASOSABS 0.0 10/06/2019 8676

## 2019-10-30 NOTE — Anesthesia Postprocedure Evaluation (Signed)
Anesthesia Post Note  Patient: Robert Lowery  Procedure(s) Performed: THORACOTOMY MAJOR (Right Chest) WEDGE RESECTION (Right Chest)     Anesthesia Post Evaluation  Last Vitals:  Vitals:   10/30/19 1000 10/30/19 1010  BP:  136/72  Pulse: 64 64  Resp: 20 17  Temp:    SpO2: 99% 96%    Last Pain:  Vitals:   10/30/19 0945  TempSrc:   PainSc: 8                  Kayren Holck

## 2019-10-31 ENCOUNTER — Inpatient Hospital Stay (HOSPITAL_COMMUNITY): Payer: Medicare Other

## 2019-10-31 ENCOUNTER — Encounter (HOSPITAL_COMMUNITY): Payer: Self-pay | Admitting: Surgery

## 2019-10-31 LAB — POCT I-STAT 7, (LYTES, BLD GAS, ICA,H+H)
Acid-base deficit: 1 mmol/L (ref 0.0–2.0)
Bicarbonate: 23.9 mmol/L (ref 20.0–28.0)
Calcium, Ion: 1.14 mmol/L — ABNORMAL LOW (ref 1.15–1.40)
HCT: 21 % — ABNORMAL LOW (ref 39.0–52.0)
Hemoglobin: 7.1 g/dL — ABNORMAL LOW (ref 13.0–17.0)
O2 Saturation: 100 %
Potassium: 3.7 mmol/L (ref 3.5–5.1)
Sodium: 142 mmol/L (ref 135–145)
TCO2: 25 mmol/L (ref 22–32)
pCO2 arterial: 41.7 mmHg (ref 32.0–48.0)
pH, Arterial: 7.365 (ref 7.350–7.450)
pO2, Arterial: 461 mmHg — ABNORMAL HIGH (ref 83.0–108.0)

## 2019-10-31 LAB — BLOOD GAS, ARTERIAL
Acid-Base Excess: 1.2 mmol/L (ref 0.0–2.0)
Bicarbonate: 25.4 mmol/L (ref 20.0–28.0)
FIO2: 21
O2 Saturation: 95.6 %
Patient temperature: 37
pCO2 arterial: 41.6 mmHg (ref 32.0–48.0)
pH, Arterial: 7.403 (ref 7.350–7.450)
pO2, Arterial: 82.7 mmHg — ABNORMAL LOW (ref 83.0–108.0)

## 2019-10-31 LAB — BASIC METABOLIC PANEL
Anion gap: 9 (ref 5–15)
BUN: 21 mg/dL (ref 8–23)
CO2: 25 mmol/L (ref 22–32)
Calcium: 8.7 mg/dL — ABNORMAL LOW (ref 8.9–10.3)
Chloride: 104 mmol/L (ref 98–111)
Creatinine, Ser: 1.02 mg/dL (ref 0.61–1.24)
GFR calc Af Amer: >60 mL/min (ref >60)
GFR calc non Af Amer: >60 mL/min (ref >60)
Glucose, Bld: 104 mg/dL — ABNORMAL HIGH (ref 70–99)
Potassium: 4.3 mmol/L (ref 3.5–5.1)
Sodium: 138 mmol/L (ref 135–145)

## 2019-10-31 LAB — GLUCOSE, CAPILLARY
Glucose-Capillary: 119 mg/dL — ABNORMAL HIGH (ref 70–99)
Glucose-Capillary: 124 mg/dL — ABNORMAL HIGH (ref 70–99)
Glucose-Capillary: 138 mg/dL — ABNORMAL HIGH (ref 70–99)
Glucose-Capillary: 85 mg/dL (ref 70–99)
Glucose-Capillary: 88 mg/dL (ref 70–99)
Glucose-Capillary: 96 mg/dL (ref 70–99)

## 2019-10-31 LAB — CBC
HCT: 28.2 % — ABNORMAL LOW (ref 39.0–52.0)
Hemoglobin: 9.3 g/dL — ABNORMAL LOW (ref 13.0–17.0)
MCH: 32.1 pg (ref 26.0–34.0)
MCHC: 33 g/dL (ref 30.0–36.0)
MCV: 97.2 fL (ref 80.0–100.0)
Platelets: 168 10*3/uL (ref 150–400)
RBC: 2.9 MIL/uL — ABNORMAL LOW (ref 4.22–5.81)
RDW: 13 % (ref 11.5–15.5)
WBC: 9.1 10*3/uL (ref 4.0–10.5)
nRBC: 0 % (ref 0.0–0.2)

## 2019-10-31 LAB — SURGICAL PATHOLOGY

## 2019-10-31 NOTE — Progress Notes (Addendum)
1 Day Post-Op Procedure(s) (LRB): THORACOTOMY MAJOR (Right) WEDGE RESECTION (Right) Subjective: Awake and alert, sitting up working on incentive spirometer with good effort. When asked about pain he responded "not much". Said he has used the PCA only once.   Objective: Vital signs in last 24 hours: Temp:  [97.4 F (36.3 C)-98.6 F (37 C)] 98 F (36.7 C) (11/17 0403) Pulse Rate:  [52-69] 57 (11/17 0717) Cardiac Rhythm: Sinus bradycardia (11/17 0700) Resp:  [14-24] 18 (11/17 0717) BP: (113-138)/(68-75) 128/68 (11/17 0403) SpO2:  [96 %-100 %] 98 % (11/17 0427) Arterial Line BP: (134-158)/(53-73) 138/53 (11/16 1126)  Hemodynamic parameters for last 24 hours:    Intake/Output from previous day: 11/16 0701 - 11/17 0700 In: 1534.5 [I.V.:1434.5; IV Piggyback:100] Out: 1101 [Urine:985; Blood:20; Chest Tube:96] Intake/Output this shift: No intake/output data recorded.  General appearance: alert, cooperative and no distress Neurologic: intact Heart: SR 57-60/min.  Lungs: clear to auscultation bilaterally. Minimal chest tube drainage and no air leak. CXR reviewed and shows CT in good position, no PTX, minor right lateral subQ air unchanged from yesterday.  Wound: The chest tube insertion site and thoracotomy incision are covered with dry dressings.   Lab Results: Recent Labs    10/30/19 1302 10/31/19 0220  WBC 10.6* 9.1  HGB 9.6* 9.3*  HCT 29.4* 28.2*  PLT 170 168   BMET:  Recent Labs    10/31/19 0220  NA 138  K 4.3  CL 104  CO2 25  GLUCOSE 104*  BUN 21  CREATININE 1.02  CALCIUM 8.7*    PT/INR: No results for input(s): LABPROT, INR in the last 72 hours. ABG    Component Value Date/Time   PHART 7.403 10/31/2019 0305   HCO3 25.4 10/31/2019 0305   O2SAT 95.6 10/31/2019 0305   CBG (last 3)  Recent Labs    10/30/19 1952 10/31/19 0011 10/31/19 0420  GLUCAP 96 138* 119*    Assessment/Plan: S/P Procedure(s) (LRB): THORACOTOMY MAJOR (Right) WEDGE RESECTION  (Right)  -POD1 right mini thoracotomy with wedge resection of RUL nodule. Stable respiratory status. No air leak and minimal drainage. Will place CT to water seal and repeat CXR in AM. Path is pending.   LOS: 1 day    Antony Odea, PA-C 10/31/2019   Chart reviewed, patient examined, agree with above. He feels well and has been ambulating.  No air leak. Will remove CT in am if no leak and CXR ok in am.

## 2019-10-31 NOTE — Plan of Care (Signed)
  Problem: Education: Goal: Knowledge of General Education information will improve Description: Including pain rating scale, medication(s)/side effects and non-pharmacologic comfort measures Outcome: Progressing   Problem: Clinical Measurements: Goal: Ability to maintain clinical measurements within normal limits will improve Outcome: Progressing Goal: Will remain free from infection Outcome: Progressing Goal: Diagnostic test results will improve Outcome: Progressing Goal: Respiratory complications will improve Outcome: Progressing Goal: Cardiovascular complication will be avoided Outcome: Progressing   Problem: Nutrition: Goal: Adequate nutrition will be maintained Outcome: Progressing   Problem: Activity: Goal: Risk for activity intolerance will decrease Outcome: Progressing   Problem: Coping: Goal: Level of anxiety will decrease Outcome: Progressing   Problem: Elimination: Goal: Will not experience complications related to bowel motility Outcome: Progressing Goal: Will not experience complications related to urinary retention Outcome: Progressing   Problem: Pain Managment: Goal: General experience of comfort will improve Outcome: Progressing   Problem: Safety: Goal: Ability to remain free from injury will improve Outcome: Progressing   Problem: Skin Integrity: Goal: Risk for impaired skin integrity will decrease Outcome: Progressing

## 2019-11-01 ENCOUNTER — Inpatient Hospital Stay (HOSPITAL_COMMUNITY): Payer: Medicare Other

## 2019-11-01 LAB — COMPREHENSIVE METABOLIC PANEL
ALT: 11 U/L (ref 0–44)
AST: 18 U/L (ref 15–41)
Albumin: 3.2 g/dL — ABNORMAL LOW (ref 3.5–5.0)
Alkaline Phosphatase: 36 U/L — ABNORMAL LOW (ref 38–126)
Anion gap: 7 (ref 5–15)
BUN: 17 mg/dL (ref 8–23)
CO2: 26 mmol/L (ref 22–32)
Calcium: 8.4 mg/dL — ABNORMAL LOW (ref 8.9–10.3)
Chloride: 107 mmol/L (ref 98–111)
Creatinine, Ser: 0.9 mg/dL (ref 0.61–1.24)
GFR calc Af Amer: >60 mL/min (ref >60)
GFR calc non Af Amer: >60 mL/min (ref >60)
Glucose, Bld: 108 mg/dL — ABNORMAL HIGH (ref 70–99)
Potassium: 4.3 mmol/L (ref 3.5–5.1)
Sodium: 140 mmol/L (ref 135–145)
Total Bilirubin: 0.9 mg/dL (ref 0.3–1.2)
Total Protein: 5.7 g/dL — ABNORMAL LOW (ref 6.5–8.1)

## 2019-11-01 LAB — CBC
HCT: 26 % — ABNORMAL LOW (ref 39.0–52.0)
Hemoglobin: 8.7 g/dL — ABNORMAL LOW (ref 13.0–17.0)
MCH: 32.7 pg (ref 26.0–34.0)
MCHC: 33.5 g/dL (ref 30.0–36.0)
MCV: 97.7 fL (ref 80.0–100.0)
Platelets: 139 10*3/uL — ABNORMAL LOW (ref 150–400)
RBC: 2.66 MIL/uL — ABNORMAL LOW (ref 4.22–5.81)
RDW: 13 % (ref 11.5–15.5)
WBC: 5.7 10*3/uL (ref 4.0–10.5)
nRBC: 0 % (ref 0.0–0.2)

## 2019-11-01 LAB — GLUCOSE, CAPILLARY
Glucose-Capillary: 104 mg/dL — ABNORMAL HIGH (ref 70–99)
Glucose-Capillary: 122 mg/dL — ABNORMAL HIGH (ref 70–99)
Glucose-Capillary: 171 mg/dL — ABNORMAL HIGH (ref 70–99)
Glucose-Capillary: 47 mg/dL — ABNORMAL LOW (ref 70–99)
Glucose-Capillary: 71 mg/dL (ref 70–99)
Glucose-Capillary: 86 mg/dL (ref 70–99)

## 2019-11-01 MED ORDER — ENOXAPARIN SODIUM 40 MG/0.4ML ~~LOC~~ SOLN
40.0000 mg | SUBCUTANEOUS | Status: DC
  Administered 2019-11-01: 40 mg via SUBCUTANEOUS
  Filled 2019-11-01: qty 0.4

## 2019-11-01 NOTE — Progress Notes (Addendum)
Hypoglycemic after receiving coverage insulin morning dose. Pt drank apple juice x 2 and recheck was 122. PA Tessa notified this matter and d/c CBG checking with insulin coverage. Patient has language barrier, he was born in [place]. He understood little bit when Dr. Cyndia Bent explained him about lung cancer cell, but not fully understanding. Patient wanted to talk to doctor with interpretor. Paging PA Myron regarding this matter and this nurse translated English to [place]. PA Parks Neptune came to explain what the result of biopsy. Now Patient fully understood it.  HS Truman Hayward RN Wasted Morphine 13 ml and witness by [employer]. HS [employer]

## 2019-11-01 NOTE — Progress Notes (Addendum)
2 Days Post-Op Procedure(s) (LRB): THORACOTOMY MAJOR (Right) WEDGE RESECTION (Right) Subjective: Says he feels good. Used the PCA only once again last night. Sats stable 95-99% on RA.  Objective: Vital signs in last 24 hours: Temp:  [97.5 F (36.4 C)-98.4 F (36.9 C)] 98 F (36.7 C) (11/18 0422) Pulse Rate:  [49-55] 49 (11/18 0422) Cardiac Rhythm: Normal sinus rhythm (11/17 1900) Resp:  [12-20] 16 (11/18 0422) BP: (109-121)/(60-67) 119/67 (11/18 0422) SpO2:  [96 %-100 %] 99 % (11/18 0422)   Intake/Output from previous day: 11/17 0701 - 11/18 0700 In: 1814.3 [P.O.:120; I.V.:1694.3] Out: 140 [Chest Tube:140] Intake/Output this shift: No intake/output data recorded.  Physical Exam: General appearance: alert, cooperative and no distress Neurologic: intact Heart: SR~60/min.  Lungs: clear to auscultation bilaterally. Minimal chest tube drainage and no air leak. CXR reviewed and shows CT in good position, no PTX, minor right lateral subQ air has migrated superiorly toward axilla on right.  Wound: The chest tube insertion site is dry and thoracotomy incision is intact, clean, and dry.   Lab Results: Recent Labs    10/31/19 0220 11/01/19 0244  WBC 9.1 5.7  HGB 9.3* 8.7*  HCT 28.2* 26.0*  PLT 168 139*   BMET:  Recent Labs    10/31/19 0220 11/01/19 0244  NA 138 140  K 4.3 4.3  CL 104 107  CO2 25 26  GLUCOSE 104* 108*  BUN 21 17  CREATININE 1.02 0.90  CALCIUM 8.7* 8.4*    PT/INR: No results for input(s): LABPROT, INR in the last 72 hours. ABG    Component Value Date/Time   PHART 7.403 10/31/2019 0305   HCO3 25.4 10/31/2019 0305   TCO2 25 10/30/2019 0815   ACIDBASEDEF 1.0 10/30/2019 0815   O2SAT 95.6 10/31/2019 0305   CBG (last 3)  Recent Labs    10/31/19 2020 11/01/19 0014 11/01/19 0426  GLUCAP 85 86 104*    Assessment/Plan: S/P Procedure(s) (LRB): THORACOTOMY MAJOR (Right) WEDGE RESECTION (Right)  -POD2 right mini thoracotomy with wedge resection  of RUL nodule. Stable respiratory status. No air leak and minimal drainage. Will remove the CT today and repeat CXR in AM. D/C PCA and IVF.  Anticipate discharge in AM.  -Expected acute blood loss anemia-mild, no indication for transfusion. Monitor.   -Hyperglycemia-mild, continue SSI  -DVT PPX- Lovenox daily.   LOS: 2 days    Antony Odea, Hershal Coria [phone] 11/01/2019  SURGICAL PATHOLOGY  CASE: [id number]  PATIENT: Robert Lowery  Surgical Pathology Report      Clinical History: RUL lung nodule (cm)      FINAL MICROSCOPIC DIAGNOSIS:   A. 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 See comment    COMMENT:   The focus of carcinoma at the margin is grossly away from the larger  tumor nodule and likely represents a separate microscopic focus of  metastatic carcinoma.      GROSS DESCRIPTION:   Specimen: Lung, right upper lobe wedge resection, received fresh  Specimen integrity: Intact  Size, weight: 10 g, 9.2 x 3.4 x 2 cm  Pleura: Pink-red to slightly anthracotic, with a 1 cm in diameter  slightly umbilicated area. 1 staple line on 1 aspect.  Lesion: On sectioning through the area of umbilicated pleura, there is a  1.2 x 1.2 x  1.1 cm indurated, ill-defined area of pale yellow-pink to  anthracotic tissue, which has a central 0.8 cm in length and 0.3 cm  diameter cavitary area containing clear gelatinous material.  Margin(s): The area of indurated, ill-defined tissue is 0.8 cm from the  nearest staple line.  Block Summary:  Blocks 1-3 = area of ill-defined, indurated tissue, entirely submitted  Block 4 = tissue adjacent to stapled margin nearest ill-defined,  indurated tissue  SW 10/30/2019    Final Diagnosis performed by Jaquita Folds,  MD.  Electronically  signed 10/31/2019      Chart reviewed, patient examined, agree with above. CXR ok. No air leak. Will remove chest tube. Pathology showed a second focus of microscopic carcinoma at the staple line that appeared to be grossly away from the larger tumor nodule. Unfortunately I did the largest wedge resection around the larger nodule possible without doing a lobectomy, which I would not do in this patient with stage IV cancer. He will likely need XRT to the area of the staple line. He also had hypermetabolic activity in bilateral hilar lymph nodes that was minimally decreased after chemo and could also be persistent metastatic disease. He will need to follow up with Dr. Alen Blew and Dr. Tammi Klippel. I discussed these results with him but I don't think he understands all of it and will need an interpreter to discuss it with him in detail.

## 2019-11-02 ENCOUNTER — Inpatient Hospital Stay (HOSPITAL_COMMUNITY): Payer: Medicare Other

## 2019-11-02 MED ORDER — TRAMADOL HCL 50 MG PO TABS: 50.0000 mg | ORAL_TABLET | Freq: Four times a day (QID) | ORAL | 0 refills | Status: AC | PRN

## 2019-11-02 NOTE — Plan of Care (Signed)

## 2019-11-02 NOTE — Progress Notes (Signed)
3 Days Post-Op Procedure(s) (LRB): THORACOTOMY MAJOR (Right) WEDGE RESECTION (Right) Subjective: Awake and alert, no new concerns. Says he has "no pain" since chest tube removed.   Objective: Vital signs in last 24 hours: Temp:  [98 F (36.7 C)-98.4 F (36.9 C)] 98.3 F (36.8 C) (11/19 0306) Pulse Rate:  [49-54] 54 (11/19 0306) Cardiac Rhythm: Sinus bradycardia (11/19 0306) Resp:  [13-20] 15 (11/19 0306) BP: (115-139)/(53-75) 123/53 (11/19 0306) SpO2:  [97 %-99 %] 98 % (11/18 2320)     Intake/Output from previous day: 11/18 0701 - 11/19 0700 In: 517.8 [P.O.:320; I.V.:197.8] Out: -  Intake/Output this shift: No intake/output data recorded.  Physical Exam: General appearance:alert, cooperative and no distress Neurologic:intact Heart:SR~60/min. Lungs:clear to auscultation bilaterally. CXR reviewed and shows no PTX, minor right lateral subQ air is resolving.  Wound:The chest tube insertion site is covered with a dry, intact dressing. The thoracotomy incision is open to air and is intact, clean, and dry.   Lab Results: Recent Labs    10/31/19 0220 11/01/19 0244  WBC 9.1 5.7  HGB 9.3* 8.7*  HCT 28.2* 26.0*  PLT 168 139*   BMET:  Recent Labs    10/31/19 0220 11/01/19 0244  NA 138 140  K 4.3 4.3  CL 104 107  CO2 25 26  GLUCOSE 104* 108*  BUN 21 17  CREATININE 1.02 0.90  CALCIUM 8.7* 8.4*    PT/INR: No results for input(s): LABPROT, INR in the last 72 hours. ABG    Component Value Date/Time   PHART 7.403 10/31/2019 0305   HCO3 25.4 10/31/2019 0305   TCO2 25 10/30/2019 0815   ACIDBASEDEF 1.0 10/30/2019 0815   O2SAT 95.6 10/31/2019 0305   CBG (last 3)  Recent Labs    11/01/19 1034 11/01/19 1126 11/01/19 1232  GLUCAP 71 47* 122*    Assessment/Plan: S/P Procedure(s) (LRB): THORACOTOMY MAJOR (Right) WEDGE RESECTION (Right)  -POD3 right mini thoracotomy with wedge resection of RUL nodule. Stable respiratory status.CXR is satisfactory after CT  removal.  Path discussed with Mr. Gammel yesterday by Dr. Cyndia Bent.  Plan discharge to home today. To follow up with Dr. Cyndia Bent in the office on 11/15/19 with a CXR.  Will arrange follow up with Dr. Alen Blew (oncology) and Dr. Ledon Snare (Radiation oncology).  -Hypoglycemia yesterday- treated and resolved.     LOS: 3 days    Antony Odea, PA-C [phone] 11/02/2019

## 2019-11-14 ENCOUNTER — Other Ambulatory Visit: Payer: Self-pay | Admitting: Surgery

## 2019-11-14 DIAGNOSIS — C3490 Malignant neoplasm of unspecified part of unspecified bronchus or lung: Secondary | ICD-10-CM

## 2019-11-14 NOTE — Progress Notes (Signed)
cxr 

## 2019-11-15 ENCOUNTER — Encounter: Payer: Self-pay | Admitting: Surgery

## 2019-11-15 ENCOUNTER — Other Ambulatory Visit: Payer: Self-pay

## 2019-11-15 ENCOUNTER — Ambulatory Visit
Admission: RE | Admit: 2019-11-15 | Discharge: 2019-11-15 | Disposition: A | Discharge status: Home / Self Care | Payer: Medicare Other | Source: Ambulatory Visit | Attending: Surgery | Admitting: Surgery

## 2019-11-15 ENCOUNTER — Ambulatory Visit (INDEPENDENT_AMBULATORY_CARE_PROVIDER_SITE_OTHER): Payer: Self-pay | Admitting: Surgery

## 2019-11-15 VITALS — BP 128/74 | HR 55 | Temp 97.7°F | Resp 16 | Ht 64.0 in | Wt 130.0 lb

## 2019-11-15 DIAGNOSIS — Z9889 Other specified postprocedural states: Secondary | ICD-10-CM

## 2019-11-15 DIAGNOSIS — C7801 Secondary malignant neoplasm of right lung: Secondary | ICD-10-CM

## 2019-11-15 DIAGNOSIS — C3490 Malignant neoplasm of unspecified part of unspecified bronchus or lung: Secondary | ICD-10-CM

## 2019-11-15 NOTE — Progress Notes (Signed)
HPI: Patient returns for routine postoperative follow-up having undergone right muscle-sparing thoracotomy with wedge resection of a right upper lobe lung nodule on 10/30/2019. The patient's early postoperative recovery while in the hospital was notable for an uncomplicated postoperative course.  The final pathology showed a 1.2 cm nodule with residual metastatic urothelial carcinoma.  There is also a separate focus of microscopic metastatic carcinoma at the staple line. Since hospital discharge the patient reports that he has been feeling well.  He has minimal chest wall soreness and has been walking without shortness of breath.   Current Outpatient Medications  Medication Sig Dispense Refill  . Ascorbic Acid (VITAMIN C) 1000 MG tablet Take 1,000 mg by mouth daily.    Marland Kitchen aspirin 81 MG chewable tablet Chew 1 tablet (81 mg total) by mouth daily.    . cholecalciferol (VITAMIN D3) 25 MCG (1000 UT) tablet Take 1,000 Units by mouth daily.    Marland Kitchen CINNAMON PO Take 1,000 mg by mouth daily.    . lansoprazole (PREVACID) 15 MG capsule Take 15 mg by mouth daily before breakfast.    . Multiple Vitamin (MULTIVITAMIN WITH MINERALS) TABS tablet Take 1 tablet by mouth daily.    . prochlorperazine (COMPAZINE) 10 MG tablet TAKE ONE TABLET BY MOUTH EVERY SIX HOURS AS NEEDED FOR NAUSEA OR VOMITING 30 tablet 0  . lidocaine-prilocaine (EMLA) cream APPLY ONE APPLICATION TOPICALLY AS NEEDED (Patient not taking: Reported on 10/19/2019) 30 g 0  . promethazine (PHENERGAN) 25 MG tablet Take 1 tablet (25 mg total) by mouth every 6 (six) hours as needed for nausea or vomiting. (Patient not taking: Reported on 10/19/2019) 50 tablet 0   No current facility-administered medications for this visit.     Physical Exam: BP 128/74 (BP Location: Right Arm, Patient Position: Sitting, Cuff Size: Normal)   Pulse (!) 55   Temp 97.7 F (36.5 C)   Resp 16   Ht 5\' 4"  (1.626 m)   Wt 130 lb (59 kg)   SpO2 97% Comment: ON RA  BMI  22.31 kg/m  He looks well. Cardiac exam shows a regular rate and rhythm with normal heart sounds. Lung exam is clear. The right thoracotomy incision is healing well.  Diagnostic Tests:  CLINICAL DATA:  63 year old male with history of lung cancer status post resection on 10/30/2019. Follow-up exam.  EXAM: CHEST - 2 VIEW  COMPARISON:  Chest radiograph dated 11/02/2019.  FINDINGS: Right-sided Port-A-Cath with tip in the region of the cavoatrial junction in similar position. Postsurgical changes of the right upper lobe as well as stable calcified nodules, likely granuloma. No focal consolidation, pleural effusion, or pneumothorax. Minimal thickening of the lateral right upper lobe pleural surface, likely postsurgical. The cardiac silhouette is within normal limits. No acute osseous pathology.  IMPRESSION: 1. No acute cardiopulmonary process. 2. Stable postsurgical changes of the right upper lobe. No pneumothorax.   Electronically Signed   By: Anner Crete M.D.   On: 11/15/2019 15:38   Impression:  Overall he is making a great recovery following his surgical procedure.  I took the largest wedge resection possible without performing a right upper lobectomy and unfortunately there was still a focus of microscopic carcinoma at the staple line that was separate from the other lung nodule.  I discussed this with the patient via the interpreter here today.  All of his questions were answered.  He has a follow-up appointment with Dr. Alen Blew in January and he will decide about the need for any  further treatment.  He does have persistent hypermetabolic activity in the hilar lymph nodes bilaterally which did not change significantly after chemotherapy and is of unclear significance.  Plan:  He will follow up with Dr. Alen Blew in January.  He will return to see me if he has any problems with his incision.  I will be happy to see him back in the future if the need arises.    Gaye Pollack, MD [facility] [phone]

## 2019-12-08 ENCOUNTER — Encounter: Payer: Self-pay | Admitting: Oncology

## 2019-12-08 IMAGING — DX DG CHEST 1V PORT
1 series · 1 of 1 positions shown · non-contrast
Comparison: 10/31/2019

CLINICAL DATA: Follow-up chest tube placement and prior right upper
lobe resection

EXAM:
PORTABLE CHEST 1 VIEW

[chest ap]
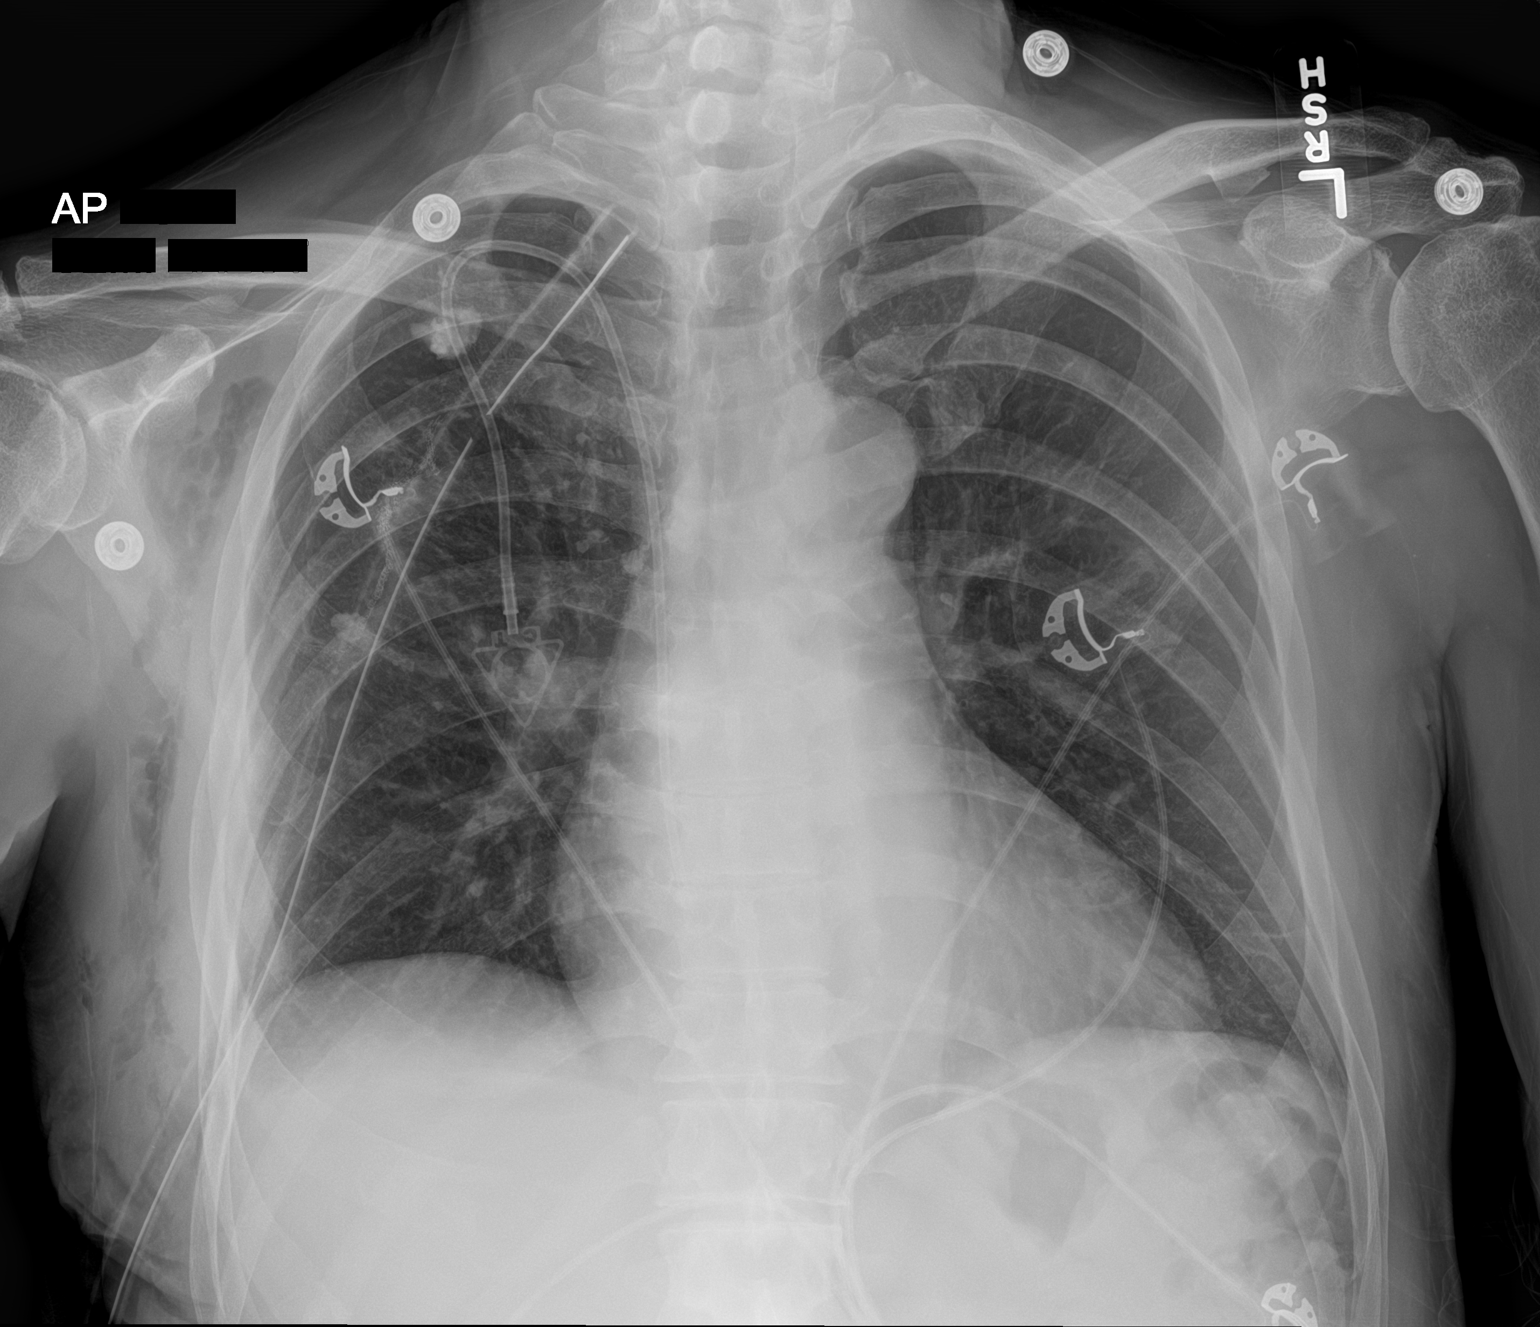

[1 of 1 positions shown; findings below may reference images not displayed]

FINDINGS: Cardiac shadow is stable. Right chest port is again seen and stable.
Right-sided thoracostomy tube is again noted. No definitive
pneumothorax is seen. Postsurgical changes are in the right upper
lobe. Changes of prior granulomatous disease are again seen and
stable. Subcutaneous emphysema is seen. Left lung remains clear.
IMPRESSION: Postsurgical changes on the right with chest tube in place. No
definitive pneumothorax is seen.

## 2019-12-11 ENCOUNTER — Telehealth: Payer: Self-pay

## 2019-12-11 NOTE — Telephone Encounter (Signed)
Called and spoke to son Shanon Brow about information below. Patient's son verbalized understanding and let patient know of Dr. Hazeline Junker response. Patient and patient's son instructed to call office with any other questions or concerns.

## 2019-12-11 NOTE — Telephone Encounter (Signed)
-----   Message from Wyatt Portela, MD sent at 12/11/2019 11:23 AM EST ----- Regarding: RE: Patient MyChart Question This is related to his surgery and nothing to be done at this point.  This is expected.  Thank you ----- Message ----- From: Teodoro Spray, RN Sent: 12/11/2019  11:01 AM EST To: Wyatt Portela, MD Subject: Patient MyChart Question                       Patient's son sent my chart message below.  Patient states he has sharp pain when touching the left side of his head along the hairline. Pain immediately subsides when area is not being touched.  Patient states this pain started "a few days ago". Patient denies headaches, dizziness, nausea, or vision changes.  Patient also reports tingling sensation in hands and feet that started prior to him completing chemo on 09/14/19/ Tingling in his feet is constant and the tingling in his hands "comes and goes"   Please advise.   My father has started experiencing sharp pain on the left side of his head when he touches the hair there. Just wanted to update the doctor.

## 2019-12-26 ENCOUNTER — Inpatient Hospital Stay: Payer: Medicare Other

## 2019-12-26 ENCOUNTER — Inpatient Hospital Stay: Payer: Medicare Other | Attending: Oncology

## 2019-12-26 ENCOUNTER — Other Ambulatory Visit: Payer: Self-pay

## 2019-12-26 DIAGNOSIS — Z79899 Other long term (current) drug therapy: Secondary | ICD-10-CM | POA: Diagnosis not present

## 2019-12-26 DIAGNOSIS — Z452 Encounter for adjustment and management of vascular access device: Secondary | ICD-10-CM | POA: Insufficient documentation

## 2019-12-26 DIAGNOSIS — Z7982 Long term (current) use of aspirin: Secondary | ICD-10-CM | POA: Diagnosis not present

## 2019-12-26 DIAGNOSIS — T451X5A Adverse effect of antineoplastic and immunosuppressive drugs, initial encounter: Secondary | ICD-10-CM | POA: Diagnosis not present

## 2019-12-26 DIAGNOSIS — D630 Anemia in neoplastic disease: Secondary | ICD-10-CM | POA: Insufficient documentation

## 2019-12-26 DIAGNOSIS — Z9221 Personal history of antineoplastic chemotherapy: Secondary | ICD-10-CM | POA: Insufficient documentation

## 2019-12-26 DIAGNOSIS — C7801 Secondary malignant neoplasm of right lung: Secondary | ICD-10-CM | POA: Diagnosis not present

## 2019-12-26 DIAGNOSIS — C670 Malignant neoplasm of trigone of bladder: Secondary | ICD-10-CM | POA: Insufficient documentation

## 2019-12-26 DIAGNOSIS — D6481 Anemia due to antineoplastic chemotherapy: Secondary | ICD-10-CM | POA: Insufficient documentation

## 2019-12-26 DIAGNOSIS — Z95828 Presence of other vascular implants and grafts: Secondary | ICD-10-CM

## 2019-12-26 DIAGNOSIS — G620 Drug-induced polyneuropathy: Secondary | ICD-10-CM | POA: Diagnosis not present

## 2019-12-26 DIAGNOSIS — D649 Anemia, unspecified: Secondary | ICD-10-CM

## 2019-12-26 DIAGNOSIS — C679 Malignant neoplasm of bladder, unspecified: Secondary | ICD-10-CM

## 2019-12-26 DIAGNOSIS — R110 Nausea: Secondary | ICD-10-CM

## 2019-12-26 LAB — CMP (CANCER CENTER ONLY)
ALT: 16 U/L (ref 0–44)
AST: 15 U/L (ref 15–41)
Albumin: 3.9 g/dL (ref 3.5–5.0)
Alkaline Phosphatase: 32 U/L — ABNORMAL LOW (ref 38–126)
Anion gap: 9 (ref 5–15)
BUN: 15 mg/dL (ref 8–23)
CO2: 27 mmol/L (ref 22–32)
Calcium: 8.4 mg/dL — ABNORMAL LOW (ref 8.9–10.3)
Chloride: 107 mmol/L (ref 98–111)
Creatinine: 0.88 mg/dL (ref 0.61–1.24)
GFR, Est AFR Am: >60 mL/min (ref >60)
GFR, Estimated: >60 mL/min (ref >60)
Glucose, Bld: 98 mg/dL (ref 70–99)
Potassium: 4.1 mmol/L (ref 3.5–5.1)
Sodium: 143 mmol/L (ref 135–145)
Total Bilirubin: 0.3 mg/dL (ref 0.3–1.2)
Total Protein: 6.4 g/dL — ABNORMAL LOW (ref 6.5–8.1)

## 2019-12-26 LAB — SAMPLE TO BLOOD BANK

## 2019-12-26 LAB — CBC WITH DIFFERENTIAL (CANCER CENTER ONLY)
Abs Immature Granulocytes: 0 10*3/uL (ref 0.00–0.07)
Basophils Absolute: 0 10*3/uL (ref 0.0–0.1)
Basophils Relative: 0 %
Eosinophils Absolute: 0.1 10*3/uL (ref 0.0–0.5)
Eosinophils Relative: 3 %
HCT: 32.2 % — ABNORMAL LOW (ref 39.0–52.0)
Hemoglobin: 10.6 g/dL — ABNORMAL LOW (ref 13.0–17.0)
Immature Granulocytes: 0 %
Lymphocytes Relative: 52 %
Lymphs Abs: 1.9 10*3/uL (ref 0.7–4.0)
MCH: 30.2 pg (ref 26.0–34.0)
MCHC: 32.9 g/dL (ref 30.0–36.0)
MCV: 91.7 fL (ref 80.0–100.0)
Monocytes Absolute: 0.2 10*3/uL (ref 0.1–1.0)
Monocytes Relative: 7 %
Neutro Abs: 1.4 10*3/uL — ABNORMAL LOW (ref 1.7–7.7)
Neutrophils Relative %: 38 %
Platelet Count: 201 10*3/uL (ref 150–400)
RBC: 3.51 MIL/uL — ABNORMAL LOW (ref 4.22–5.81)
RDW: 13.2 % (ref 11.5–15.5)
WBC Count: 3.7 10*3/uL — ABNORMAL LOW (ref 4.0–10.5)
nRBC: 0 % (ref 0.0–0.2)

## 2019-12-26 LAB — MAGNESIUM: Magnesium: 2 mg/dL (ref 1.7–2.4)

## 2019-12-26 MED ORDER — HEPARIN SOD (PORK) LOCK FLUSH 100 UNIT/ML IV SOLN
500.0000 [IU] | Freq: Once | INTRAVENOUS | Status: AC
  Administered 2019-12-26: 500 [IU]
  Filled 2019-12-26: qty 5

## 2019-12-26 MED ORDER — SODIUM CHLORIDE 0.9% FLUSH
10.0000 mL | Freq: Once | INTRAVENOUS | Status: AC
  Administered 2019-12-26: 08:00:00 10 mL
  Filled 2019-12-26: qty 10

## 2020-01-02 ENCOUNTER — Inpatient Hospital Stay (HOSPITAL_BASED_OUTPATIENT_CLINIC_OR_DEPARTMENT_OTHER): Payer: Medicare Other | Admitting: Oncology

## 2020-01-02 ENCOUNTER — Other Ambulatory Visit: Payer: Self-pay

## 2020-01-02 VITALS — BP 119/65 | HR 50 | Temp 98.5°F | Resp 16 | Ht 64.0 in | Wt 133.1 lb

## 2020-01-02 DIAGNOSIS — C670 Malignant neoplasm of trigone of bladder: Secondary | ICD-10-CM | POA: Diagnosis not present

## 2020-01-02 DIAGNOSIS — Z95828 Presence of other vascular implants and grafts: Secondary | ICD-10-CM | POA: Diagnosis not present

## 2020-01-02 NOTE — Progress Notes (Signed)
Hematology and Oncology Follow Up Visit  Robert Lowery [id number] [birth year] 63 y.o. 01/02/2020 9:20 AM [name], MDMoreira, [name], MD   Principle Diagnosis: 64 year old with stage IV bladder cancer documented in June 2020.  He was initially diagnosed with localized disease in 2019.  Prior Therapy:   He is status post cystectomy done in July 2019.  Surgery performed in [place]. He is status post lung biopsy completed on June 2 of 2020. Gemcitabine and cisplatin chemotherapy cycle 1 started on 06/02/2019.  He completed 6 cycles of therapy on September 21, 2019.  He had a near complete response with a residual pulmonary nodule. He is status post wedge resection of right upper lobe residual tumor using a right thoracotomy completed on October 30, 2019.  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  Current therapy: Active surveillance.    Interim History: Robert Lowery returns today for repeat evaluation.  Since the last visit, he underwent right thoracotomy in November 4431 without complications.  Since his surgery, he reports no major complaints.  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  Still able to drive and attends activities of daily living.  Neuropathy is not interfering with function is not causing any pain.                Medications: Updated without any changes. Current Outpatient Medications  Medication Sig Dispense Refill  . Ascorbic Acid (VITAMIN C) 1000 MG tablet Take 1,000 mg by mouth daily.    Marland Kitchen aspirin 81 MG chewable tablet Chew 1 tablet (81 mg total) by mouth daily.    . cholecalciferol (VITAMIN D3) 25 MCG (1000 UT) tablet Take 1,000 Units by mouth daily.    Marland Kitchen CINNAMON PO Take 1,000 mg by mouth daily.    . lansoprazole (PREVACID) 15 MG capsule Take 15 mg by mouth daily before breakfast.    . lidocaine-prilocaine (EMLA) cream APPLY ONE APPLICATION TOPICALLY AS NEEDED  (Patient not taking: Reported on 10/19/2019) 30 g 0  . Multiple Vitamin (MULTIVITAMIN WITH MINERALS) TABS tablet Take 1 tablet by mouth daily.    . prochlorperazine (COMPAZINE) 10 MG tablet TAKE ONE TABLET BY MOUTH EVERY SIX HOURS AS NEEDED FOR NAUSEA OR VOMITING 30 tablet 0  . promethazine (PHENERGAN) 25 MG tablet Take 1 tablet (25 mg total) by mouth every 6 (six) hours as needed for nausea or vomiting. (Patient not taking: Reported on 10/19/2019) 50 tablet 0   No current facility-administered medications for this visit.     Allergies: No Known Allergies     Physical Exam:     Blood pressure 119/65, pulse (!) 50, temperature 98.5 F (36.9 C), temperature source Temporal, resp. rate 16, height 5\' 4"  (1.626 m), weight 133 lb 1.6 oz (60.4 kg), SpO2 100 %.    ECOG: 0    General appearance: Alert, awake without any distress. Head: Atraumatic without abnormalities Oropharynx: Without any thrush or ulcers. Eyes: No scleral icterus. Lymph nodes: No lymphadenopathy noted in the cervical, supraclavicular, or axillary nodes Heart:regular rate and rhythm, without any murmurs or gallops.   Lung: Clear to auscultation without any rhonchi, wheezes or dullness to percussion. Abdomin: Soft, nontender without any shifting dullness or ascites. Musculoskeletal: No clubbing or cyanosis. Neurological: No motor or sensory deficits. Skin: No rashes or lesions.         Lab Results: Lab Results  Component Value Date   WBC 3.7 (L) 12/26/2019  HGB 10.6 (L) 12/26/2019   HCT 32.2 (L) 12/26/2019   MCV 91.7 12/26/2019   PLT 201 12/26/2019     Chemistry      Component Value Date/Time   NA 143 12/26/2019 0826   K 4.1 12/26/2019 0826   CL 107 12/26/2019 0826   CO2 27 12/26/2019 0826   BUN 15 12/26/2019 0826   CREATININE 0.88 12/26/2019 0826      Component Value Date/Time   CALCIUM 8.4 (L) 12/26/2019 0826   ALKPHOS 32 (L) 12/26/2019 0826   AST 15 12/26/2019 0826   ALT 16 12/26/2019  0826   BILITOT 0.3 12/26/2019 0826       Impression and Plan:   64 year old man with  1.  Stage IV bladder cancer diagnosed in 2019.  He was found to have high-grade urothelial carcinoma with pulmonary involvement.  He status post chemotherapy with residual lung nodule that was resected successfully in November 2020.   The natural course of this disease was reviewed at this time.  Treatment options moving forward were discussed.  The next step is to update his staging status at this time with a repeat imaging studies in the immediate future.  If he continues to be disease-free and the options would be to proceed with active surveillance versus maintenance immunotherapy.  He continues to have residual malignancy, I would recommend switching him to immunotherapy maintenance at that time.  The role for radiation therapy was also discussed if he has very small localized nodule.  2. IV access:Port-A-Cath remains in place without any issues and will be flushed periodically.   3.  Anemia: hemoglobin is recovering at this time with close to normal.  His anemia is related to malignancy and chemotherapy.   4. Follow-up: Will be the immediate future after his PET scan to discuss these results.   30  minutes was spent on this encounter.  The time was dedicated to reviewing laboratory data, pathology reports, discussing treatment options and future plan of care.      Zola Button, MD 1/19/20219:20 AM

## 2020-01-03 ENCOUNTER — Telehealth: Payer: Self-pay | Admitting: Oncology

## 2020-01-03 NOTE — Telephone Encounter (Signed)
No los per 1/19.

## 2020-01-10 ENCOUNTER — Ambulatory Visit (HOSPITAL_COMMUNITY)
Admission: RE | Admit: 2020-01-10 | Discharge: 2020-01-10 | Disposition: A | Discharge status: Home / Self Care | Payer: Medicare Other | Source: Ambulatory Visit | Attending: Oncology | Admitting: Oncology

## 2020-01-10 ENCOUNTER — Other Ambulatory Visit: Payer: Self-pay

## 2020-01-10 DIAGNOSIS — C670 Malignant neoplasm of trigone of bladder: Secondary | ICD-10-CM | POA: Diagnosis present

## 2020-01-10 LAB — GLUCOSE, CAPILLARY: Glucose-Capillary: 83 mg/dL (ref 70–99)

## 2020-01-10 MED ORDER — FLUDEOXYGLUCOSE F - 18 (FDG) INJECTION
6.6000 | Freq: Once | INTRAVENOUS | Status: AC
  Administered 2020-01-10: 6.6 via INTRAVENOUS

## 2020-01-15 ENCOUNTER — Ambulatory Visit: Payer: Medicare Other

## 2020-01-16 ENCOUNTER — Inpatient Hospital Stay: Payer: Medicare Other | Attending: Oncology | Admitting: Oncology

## 2020-01-16 ENCOUNTER — Other Ambulatory Visit: Payer: Self-pay

## 2020-01-16 VITALS — BP 134/67 | HR 53 | Temp 98.3°F | Resp 20 | Ht 64.0 in | Wt 133.0 lb

## 2020-01-16 DIAGNOSIS — Z7982 Long term (current) use of aspirin: Secondary | ICD-10-CM | POA: Insufficient documentation

## 2020-01-16 DIAGNOSIS — Z9221 Personal history of antineoplastic chemotherapy: Secondary | ICD-10-CM | POA: Diagnosis not present

## 2020-01-16 DIAGNOSIS — R918 Other nonspecific abnormal finding of lung field: Secondary | ICD-10-CM | POA: Insufficient documentation

## 2020-01-16 DIAGNOSIS — C7800 Secondary malignant neoplasm of unspecified lung: Secondary | ICD-10-CM | POA: Insufficient documentation

## 2020-01-16 DIAGNOSIS — Z902 Acquired absence of lung [part of]: Secondary | ICD-10-CM | POA: Diagnosis not present

## 2020-01-16 DIAGNOSIS — C670 Malignant neoplasm of trigone of bladder: Secondary | ICD-10-CM

## 2020-01-16 DIAGNOSIS — Z79899 Other long term (current) drug therapy: Secondary | ICD-10-CM | POA: Insufficient documentation

## 2020-01-16 NOTE — Progress Notes (Signed)
Hematology and Oncology Follow Up Visit  Marek Nghiem [id number] 04/04/1956 64 y.o. 01/16/2020 11:38 AM [name], MDMoreira, [name], MD   Principle Diagnosis: 64 year old man with bladder cancer diagnosed in 2019.  He developed stage IV disease without pulmonary involvement.   Prior Therapy:   He is status post cystectomy done in July 2019.  Surgery performed in [place]. He is status post lung biopsy completed on June 2 of 2020. Gemcitabine and cisplatin chemotherapy cycle 1 started on 06/02/2019.  He completed 6 cycles of therapy on September 21, 2019.  He had a near complete response with a residual pulmonary nodule. He is status post wedge resection of right upper lobe residual tumor using a right thoracotomy completed on October 30, 2019.  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  Current therapy: Active surveillance.    Interim History: Mr. Hutchinson is here for a follow-up visit.  Since the last visit, he reports no major changes at this time.  He denies any abdominal pain, nausea or shortness of breath.  His performance status quality of life remain excellent.               Medications: Unchanged on review. Current Outpatient Medications  Medication Sig Dispense Refill  . Ascorbic Acid (VITAMIN C) 1000 MG tablet Take 1,000 mg by mouth daily.    Marland Kitchen aspirin 81 MG chewable tablet Chew 1 tablet (81 mg total) by mouth daily.    . cholecalciferol (VITAMIN D3) 25 MCG (1000 UT) tablet Take 1,000 Units by mouth daily.    Marland Kitchen CINNAMON PO Take 1,000 mg by mouth daily.    . lansoprazole (PREVACID) 15 MG capsule Take 15 mg by mouth daily before breakfast.    . lidocaine-prilocaine (EMLA) cream APPLY ONE APPLICATION TOPICALLY AS NEEDED (Patient not taking: Reported on 10/19/2019) 30 g 0  . Multiple Vitamin (MULTIVITAMIN WITH MINERALS) TABS tablet Take 1 tablet by mouth daily.    . prochlorperazine (COMPAZINE) 10 MG tablet TAKE ONE TABLET BY MOUTH EVERY SIX HOURS  AS NEEDED FOR NAUSEA OR VOMITING 30 tablet 0  . promethazine (PHENERGAN) 25 MG tablet Take 1 tablet (25 mg total) by mouth every 6 (six) hours as needed for nausea or vomiting. (Patient not taking: Reported on 10/19/2019) 50 tablet 0   No current facility-administered medications for this visit.     Allergies: No Known Allergies            Lab Results: Lab Results  Component Value Date   WBC 3.7 (L) 12/26/2019   HGB 10.6 (L) 12/26/2019   HCT 32.2 (L) 12/26/2019   MCV 91.7 12/26/2019   PLT 201 12/26/2019     Chemistry      Component Value Date/Time   NA 143 12/26/2019 0826   K 4.1 12/26/2019 0826   CL 107 12/26/2019 0826   CO2 27 12/26/2019 0826   BUN 15 12/26/2019 0826   CREATININE 0.88 12/26/2019 0826      Component Value Date/Time   CALCIUM 8.4 (L) 12/26/2019 0826   ALKPHOS 32 (L) 12/26/2019 0826   AST 15 12/26/2019 0826   ALT 16 12/26/2019 0826   BILITOT 0.3 12/26/2019 0826     IMPRESSION: 1. Interval resection of RIGHT upper lobe pulmonary nodule. 2. Ill-defined perivascular thickening in the RIGHT upper lobe with very low metabolic activity is favored benign. Recommend attention on follow-up. 3. Granulomatous disease with calcified pulmonary nodules and calcified mediastinal lymph nodes. Intense hilar nodal hypermetabolic activity is  favored benign granulomatous disease. 4. 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     Impression and Plan:   64 year old man with  1.  Bladder cancer diagnosed in 2019.  He subsequently developed stage IV disease with pulmonary involvement.  He status post a systemic therapy followed by surgical resection. PET CT scan obtained on January 10, 2020 was reviewed and showed no evidence of disease at this time.   Treatment options at this time were reviewed.  Given the fact that he had a positive margins on his resection specimen and negative PET scan I gave him the option of continued active surveillance versus  adjuvant radiation therapy.  Repeat to surgical resection was also discussed and not preferred at this time.  After discussion he is agreeable to consider radiation therapy adjuvantly and will make the appropriate referrals.  I recommended repeating scan in 3 to 4 months.  2. IV access:Port-A-Cath will be flushed periodically.   3. Follow-up: In 3 to 4 months for repeat evaluation.   30  minutes was dedicated to this visit.  Time was spent on reviewing imaging studies, treatment options and coordinating plan of care.      Zola Button, MD 2/2/202111:38 AM

## 2020-01-17 ENCOUNTER — Telehealth: Payer: Self-pay | Admitting: Oncology

## 2020-01-17 NOTE — Telephone Encounter (Signed)
Scheduled appt per 2/2 los.  Sent a message to HIM pool to get a calendar mailed out.

## 2020-01-21 ENCOUNTER — Ambulatory Visit: Payer: Medicare Other | Attending: Internal Medicine

## 2020-01-21 DIAGNOSIS — Z23 Encounter for immunization: Secondary | ICD-10-CM | POA: Insufficient documentation

## 2020-01-21 NOTE — Progress Notes (Signed)
   Covid-19 Vaccination Clinic  Name:  Robert Lowery    MRN: [id number] DOB: 1955-12-31  01/21/2020  [name] was observed post Covid-19 immunization for 15 minutes without incidence. He was provided with Vaccine Information Sheet and instruction to access the V-Safe system.   [name] was instructed to call 911 with any severe reactions post vaccine: Marland Kitchen Difficulty breathing  . Swelling of your face and throat  . A fast heartbeat  . A bad rash all over your body  . Dizziness and weakness    Immunizations Administered    Name Date Dose VIS Date Route   Pfizer COVID-19 Vaccine 01/21/2020 11:10 AM 0.3 mL 11/24/2019 Intramuscular   Manufacturer: [place]   Lot: [id number]   [place]: [address]-9

## 2020-01-23 ENCOUNTER — Ambulatory Visit
Admission: RE | Admit: 2020-01-23 | Discharge: 2020-01-23 | Disposition: A | Discharge status: Home / Self Care | Payer: Medicare Other | Source: Ambulatory Visit | Attending: Urology | Admitting: Urology

## 2020-01-23 ENCOUNTER — Encounter: Payer: Self-pay | Admitting: Urology

## 2020-01-23 ENCOUNTER — Inpatient Hospital Stay
Admission: RE | Admit: 2020-01-23 | Discharge: 2020-01-23 | Disposition: A | Discharge status: Home / Self Care | Payer: Medicare Other | Source: Ambulatory Visit | Attending: Urology | Admitting: Urology

## 2020-01-23 ENCOUNTER — Ambulatory Visit: Admission: RE | Admit: 2020-01-23 | Payer: Medicare Other | Source: Ambulatory Visit

## 2020-01-23 ENCOUNTER — Other Ambulatory Visit: Payer: Self-pay

## 2020-01-26 ENCOUNTER — Encounter: Payer: Self-pay | Admitting: Urology

## 2020-01-26 ENCOUNTER — Ambulatory Visit
Admission: RE | Admit: 2020-01-26 | Discharge: 2020-01-26 | Disposition: A | Discharge status: Home / Self Care | Payer: Medicare Other | Source: Ambulatory Visit | Attending: Radiation Oncology | Admitting: Radiation Oncology

## 2020-01-26 ENCOUNTER — Ambulatory Visit
Admission: RE | Admit: 2020-01-26 | Discharge: 2020-01-26 | Disposition: A | Discharge status: Home / Self Care | Payer: Medicare Other | Source: Ambulatory Visit | Attending: Urology | Admitting: Urology

## 2020-01-26 ENCOUNTER — Other Ambulatory Visit: Payer: Self-pay

## 2020-01-26 ENCOUNTER — Ambulatory Visit: Payer: Self-pay | Admitting: Urology

## 2020-01-26 VITALS — BP 131/73 | HR 47 | Temp 97.1°F | Resp 20 | Wt 133.6 lb

## 2020-01-26 DIAGNOSIS — Z87891 Personal history of nicotine dependence: Secondary | ICD-10-CM | POA: Insufficient documentation

## 2020-01-26 DIAGNOSIS — C679 Malignant neoplasm of bladder, unspecified: Secondary | ICD-10-CM | POA: Diagnosis not present

## 2020-01-26 DIAGNOSIS — Z79899 Other long term (current) drug therapy: Secondary | ICD-10-CM | POA: Insufficient documentation

## 2020-01-26 DIAGNOSIS — C7801 Secondary malignant neoplasm of right lung: Secondary | ICD-10-CM | POA: Diagnosis not present

## 2020-01-26 DIAGNOSIS — Z923 Personal history of irradiation: Secondary | ICD-10-CM | POA: Insufficient documentation

## 2020-01-26 NOTE — Progress Notes (Signed)
Radiation Oncology         [phone] ________________________________  Outpatient Re-Consultation - Conducted via in-office WebEx  Name: Robert Lowery MRN: [id number]  Date: 01/26/2020  DOB: 03-Feb-1956  CC:[name], MD  Wyatt Portela, MD   REFERRING PHYSICIAN: Wyatt Portela, MD  DIAGNOSIS: 64 y.o. 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    ICD-10-CM   1. Malignant neoplasm of urinary bladder, unspecified site ([facility])  C67.9   2. Malignant neoplasm metastatic to right lung ([facility])  C78.01     HISTORY OF PRESENT ILLNESS: Hadyn Damonie Lowery is a 64 y.o. male with a diagnosis of metastatic urothelial carcinoma with disease to the right upper lung. He is accompanied by a medical interpreter and presents today to discuss possible postoperative adjuvant radiotherapy to the right lung.  In summary, he initially presented with abdominal pain and hematuria and underwent CT A/P in 04/2018, which revealed a bladder mass. He proceeded to TURBT on 05/10/2018 with Dr. Jeffie Pollock. 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 He underwent radical cystectomy in 06/2018 in [place].  More recently, he developed a cough in 03/2019 and underwent chest x-ray that showed a 2.3 cm nodule in the RUL. He met with Dr. Cyndia Bent who recommended further evaluation with a PET scan which was performed on 05/09/2019 and showed an intensely hypermetabolic right upper lobe pulmonary nodule with bilateral hilar lymph node involvement and no other metastasis. He subsequently underwent CT guided core needle biopsy of the RUL lesion on 05/16/2019 with Dr. Reesa Chew, and final pathology confirmed metastatic carcinoma consistent with urothelial primary.  He was referred to Dr. Alen Blew on 05/25/2019, who recommended treatment with systemic chemotherapy given the concern for micrometastatic disease in the  hilar lymph nodes. He completed 6 cycles of Gemzar/Cisplatin on 09/21/2019. Restaging PET scan performed on 10/05/2019 showed a partial response to treatment with mildly reduced size/solidity of the RUL pulmonary nodule with continued roughly stable, right greater than left hypermetabolic activity in hilar lymph nodes and widespread bony activity with no focal bony hypermetabolic lesions.  We saw the patient in consultation on 10/13/2019. At that time, the patient was gathering information about treatment options and continued on to discuss surgery with Dr. Cyndia Bent on 10/18/2019.  Following that discussion, he opted to proceed with right thoracotomy and RUL wedge resection which was performed on 10/30/2019.  Final surgical pathology  revealed metastatic carcinoma to lung, consistent with patient's history of primary urothelial carcinoma.  There was a positive resection margin with a microscopic focus of carcinoma that was grossly away from the main nodule and likely represented a separate microscopic focus of metastatic carcinoma.   He had a repeat cystoscopy with Dr, Jeffie Pollock on 12/18/19 which was without evidence of disease recurrence or progression in the neobladder. He underwent restaging PET scan on 01/10/2020 which revealed benign appearing ill-defined perivascular thickening in right upper lobe with very low metabolic activity, granulomatous disease with calcified pulmonary nodules and calcified mediastinal lymph nodes and stable, intense hypermetabolic hilar nodes- felt likely benign granulomatous disease. Neobladder with no clear evidence of local recurrence.   The patient reviewed the most recent restaging PET scan results and pathology results with his medical oncologist and he has kindly been referred today for discussion of potential radiation treatment options of the positive margin in the RUL lung.   PREVIOUS RADIATION THERAPY: No  PAST MEDICAL HISTORY:  Past  Medical History:  Diagnosis Date  .  Bladder cancer ([place]) 05/10/2018   TUR of the BLADDER...DR. Jeffie Pollock  . Chest pain    at rest  . Hearing loss    right ear  . Hepatitis    history of Hep. B  . History of hiatal hernia 05/30/2016   Small retrocardiac, noted on CT  . Internal hemorrhoids   . Liver lesion   . Lung cancer ([facility])   . Mild cardiomegaly   . Pre-diabetes   . Scrotal nodule    Bilateral      PAST SURGICAL HISTORY: Past Surgical History:  Procedure Laterality Date  . BIOPSY TESTIS     bilateral testicle mass  . COLONOSCOPY    . CYSTOSCOPY/RETROGRADE/URETEROSCOPY N/A 05/10/2018   Procedure: BILATERAL RETROGRADE;  Surgeon: [name], MD;  Location: [facility];  Service: Urology;  Laterality: N/A;  . IR IMAGING GUIDED PORT INSERTION  05/31/2019  . THORACOTOMY Right 10/30/2019   Procedure: THORACOTOMY MAJOR;  Surgeon: Gaye Pollack, MD;  Location[facility] OR;  Service: Thoracic;  Laterality: Right;  . TRANSURETHRAL RESECTION OF BLADDER TUMOR WITH MITOMYCIN-C N/A 05/10/2018   Procedure: CYSTOSCOPY TRANSURETHRAL RESECTION OF BLADDER TUMOR WITH MITOMYCIN-C;  Surgeon: [name], MD;  Location: [facility];  Service: Urology;  Laterality: N/A;  . UPPER GI ENDOSCOPY    . WEDGE RESECTION Right 10/30/2019   Procedure: WEDGE RESECTION;  Surgeon: Gaye Pollack, MD;  Location: [facility] OR;  Service: Thoracic;  Laterality: Right;    FAMILY HISTORY: No family history on file.  SOCIAL HISTORY:  Social History   Socioeconomic History  . Marital status: Married    Spouse name: Not on file  . Number of children: Not on file  . Years of education: Not on file  . Highest education level: Not on file  Occupational History  . Not on file  Tobacco Use  . Smoking status: Former Research scientist (life sciences)  . Smokeless tobacco: Never Used  Substance and Sexual Activity  . Alcohol use: Never  . Drug use: Never  . Sexual activity: Not on file  Other Topics Concern  . Not on file  Social History Narrative  . Not on file   Social Determinants of Health    Financial Resource Strain:   . Difficulty of Paying Living Expenses: Not on file  Food Insecurity:   . Worried About Charity fundraiser in the Last Year: Not on file  . Ran Out of Food in the Last Year: Not on file  Transportation Needs:   . Lack of Transportation (Medical): Not on file  . Lack of Transportation (Non-Medical): Not on file  Physical Activity:   . Days of Exercise per Week: Not on file  . Minutes of Exercise per Session: Not on file  Stress:   . Feeling of Stress : Not on file  Social Connections:   . Frequency of Communication with Friends and Family: Not on file  . Frequency of Social Gatherings with Friends and Family: Not on file  . Attends Religious Services: Not on file  . Active Member of Clubs or Organizations: Not on file  . Attends Archivist Meetings: Not on file  . Marital Status: Not on file  Intimate Partner Violence:   . Fear of Current or Ex-Partner: Not on file  . Emotionally Abused: Not on file  . Physically Abused: Not on file  . Sexually Abused: Not on file    ALLERGIES: Patient has no known allergies.  MEDICATIONS:  Current Outpatient Medications  Medication Sig Dispense Refill  . Ascorbic Acid (VITAMIN C) 1000 MG tablet Take 1,000 mg by mouth daily.    Marland Kitchen aspirin 81 MG chewable tablet Chew 1 tablet (81 mg total) by mouth daily.    . cholecalciferol (VITAMIN D3) 25 MCG (1000 UT) tablet Take 1,000 Units by mouth daily.    Marland Kitchen CINNAMON PO Take 1,000 mg by mouth daily.    . lansoprazole (PREVACID) 15 MG capsule Take 15 mg by mouth daily before breakfast.    . lidocaine-prilocaine (EMLA) cream APPLY ONE APPLICATION TOPICALLY AS NEEDED 30 g 0  . Multiple Vitamin (MULTIVITAMIN WITH MINERALS) TABS tablet Take 1 tablet by mouth daily.    . prochlorperazine (COMPAZINE) 10 MG tablet TAKE ONE TABLET BY MOUTH EVERY SIX HOURS AS NEEDED FOR NAUSEA OR VOMITING 30 tablet 0  . promethazine (PHENERGAN) 25 MG tablet Take 1 tablet (25 mg total) by  mouth every 6 (six) hours as needed for nausea or vomiting. 50 tablet 0   No current facility-administered medications for this encounter.    REVIEW OF SYSTEMS:  On review of systems, the patient reports that he is doing well overall. He denies any chest pain, shortness of breath, cough, fevers, chills, night sweats, unintended weight changes. He denies any bowel disturbances, abdominal pain, nausea or vomiting. He denies any new musculoskeletal or joint aches or pains.  A complete review of systems is obtained and is otherwise negative.    PHYSICAL EXAM:  Wt Readings from Last 3 Encounters:  01/26/20 133 lb 9.6 oz (60.6 kg)  01/16/20 133 lb (60.3 kg)  01/02/20 133 lb 1.6 oz (60.4 kg)   Temp Readings from Last 3 Encounters:  01/26/20 (!) 97.1 F (36.2 C)  01/16/20 98.3 F (36.8 C) (Temporal)  01/02/20 98.5 F (36.9 C) (Temporal)   BP Readings from Last 3 Encounters:  01/26/20 131/73  01/16/20 134/67  01/02/20 119/65   Pulse Readings from Last 3 Encounters:  01/26/20 (!) 47  01/16/20 (!) 53  01/02/20 (!) 50    0/10  In general this is a well appearing [place] male in no acute distress. He's alert and oriented x4 and appropriate throughout the examination. Cardiopulmonary assessment is negative for acute distress and he exhibits normal effort.   KPS = 100  100 - Normal; no complaints; no evidence of disease. 90   - Able to carry on normal activity; minor signs or symptoms of disease. 80   - Normal activity with effort; some signs or symptoms of disease. 56   - Cares for self; unable to carry on normal activity or to do active work. 60   - Requires occasional assistance, but is able to care for most of his personal needs. 50   - Requires considerable assistance and frequent medical care. 43   - Disabled; requires special care and assistance. 20   - Severely disabled; hospital admission is indicated although death not imminent. 70   - Very sick; hospital admission necessary;  active supportive treatment necessary. 10   - Moribund; fatal processes progressing rapidly. 0     - Dead  Karnofsky DA, Abelmann [place], Craver LS and Burchenal [facility] [phone]) The use of the nitrogen mustards in the palliative treatment of carcinoma: with particular reference to bronchogenic carcinoma Cancer 1 634-56  LABORATORY DATA:  Lab Results  Component Value Date   WBC 3.7 (L) 12/26/2019   HGB 10.6 (L) 12/26/2019   HCT 32.2 (L) 12/26/2019  MCV 91.7 12/26/2019   PLT 201 12/26/2019   Lab Results  Component Value Date   NA 143 12/26/2019   K 4.1 12/26/2019   CL 107 12/26/2019   CO2 27 12/26/2019   Lab Results  Component Value Date   ALT 16 12/26/2019   AST 15 12/26/2019   ALKPHOS 32 (L) 12/26/2019   BILITOT 0.3 12/26/2019     RADIOGRAPHY: NM PET Image Restag (PS) Skull Base To Thigh  Result Date: 01/10/2020 CLINICAL DATA:  Subsequent treatment strategy for bladder carcinoma. EXAM: NUCLEAR MEDICINE PET SKULL BASE TO THIGH TECHNIQUE: 6.6 mCi F-18 FDG was injected intravenously. Full-ring PET imaging was performed from the skull base to thigh after the radiotracer. CT data was obtained and used for attenuation correction and anatomic localization. Fasting blood glucose: 83 mg/dl COMPARISON:  3 PET-CT 10/05/2019, 08/01/2019 FINDINGS: Mediastinal blood pool activity: SUV max 1.9 Liver activity: SUV max 3.3 NECK: No hypermetabolic lymph nodes in the neck.  1.9 Incidental CT findings: none CHEST: New surgical staple line in the RIGHT upper lobe consistent wedge resection of previous seen metastatic pulmonary nodule. Small focus of Perivascular thickening in the RIGHT upper lobe (image 23/8) is similar prior and with low activity: SUV max equal 0.8. Calcified body in the RIGHT upper lobe in several calcified nodules in the RIGHT lower lobe. Persistent intensely hypermetabolic small hilar lymph nodes not changed over multiple comparison exams and most favored represent granulomatous adenopathy.  Calcified mediastinal lymph nodes additionally. Incidental CT findings: Port in the anterior chest wall with tip in distal SVC. ABDOMEN/PELVIS: No abnormal activity in the liver. No hypermetabolic abdominopelvic lymph nodes. Irregular bladder contour consistent with neobladder. High activity within the neobladder related to urine excretion. No concerning lesions identified. There is in the anastomosis in the RIGHT lower quadrant. Low-density lesions in the liver consistent benign hepatic cysts. Incidental CT findings: none SKELETON: Several foci of uptake in the RIGHT lateral ribs associated healed fracture. Presumably these relate to the thoracotomy. Incidental CT findings: none IMPRESSION: 1. Interval resection of RIGHT upper lobe pulmonary nodule. 2. Ill-defined perivascular thickening in the RIGHT upper lobe with very low metabolic activity is favored benign. Recommend attention on follow-up. 3. Granulomatous disease with calcified pulmonary nodules and calcified mediastinal lymph nodes. Intense hilar nodal hypermetabolic activity is favored benign granulomatous disease. 4. 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 Electronically Signed   By: Suzy Bouchard M.D.   On: 01/10/2020 15:56      IMPRESSION/PLAN: This visit was conducted via in-office WebEx with use of medical interpreter to spare the patient unnecessary potential exposure in the healthcare setting during the current COVID-19 pandemic.  1. 65 y.o. 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 Today, we talked to the patient, via medical interpreter, about the findings and workup thus far. We discussed the natural history of oligometastatic urothelial carcinoma and general treatment, highlighting the role of focused stereotactic radiotherapy to the site of surgical resection to reduce the potential for disease recurrence. We discussed the available radiation techniques, and focused on the  details of logistics and delivery as it pertains to treating the positive margin. The recommendation is to proceed with a 5 fraction course of SBRT to the tumor bed at the staple line. We reviewed his PET images and discussed the recommendation to continue to monitor the hypermetabolic hilar lymph nodes which have remained stable as compared to prior study. We reviewed the anticipated acute and late sequelae associated  with radiation in this setting. The patient was encouraged to ask questions that were answered to his satisfaction.  At the end of the conversation, the patient appears to have a good understanding of his disease and our recommendations for treatment which are of curative intent. He is in agreement with moving forward with the recommended 5 fraction course of SBRT to the positive margin in the RUL. He has provided verbal consent to proceed and will be scheduled for CT simulation/treatment planning at 8 am on Friday 02/02/2020.  We will sign formal, written consent at the time of his CT simulation and a copy of this document will be placed in his medical record.  We will share our discussion with Dr. Alen Blew and move forward with treatment in the near future.  Given current concerns for patient exposure during the COVID-19 pandemic, this encounter was conducted via in-office video-enabled WebEx visit with the patient on site with a medical interpreter present. The patient has given verbal consent for this type of encounter. The time spent during this encounter was 45 minutes. The attendants for this meeting include Tyler Pita MD, Cayleen Benjamin PA-C, [place], patient, Cale Bethard and a medical interpreter. During the encounter, Tyler Pita MD, Jeaneen Cala PA-C, and scribe, Wilburn Mylar were all located in their offices at [place].  Patient, Ava Deguire and medical interpreter were located in the exam  room.    Nicholos Johns, PA-C    Tyler Pita, MD  [facility] Direct Dial: [phone]  Fax: [phone] [email or web address]  Skype  [email or web address]   This document serves as a record of services personally performed by Tyler Pita, MD and Freeman Caldron, PA-C. It was created on their behalf by Wilburn Mylar, a trained medical scribe. The creation of this record is based on the scribe's personal observations and the provider's statements to them. This document has been checked and approved by the attending provider.

## 2020-01-26 NOTE — Progress Notes (Addendum)
Patient here for follow up visit denies any pain or discomfort at this time. No difficulty with breathing or SOB noted. No hematuria, dysuria, nocturia 1-2 times a night. No urgency, frequency, or leakage noted. Heart rate is low which is normal for patient from history of virtal signs.     Vitals:   01/26/20 1303  BP: 131/73  Pulse: (!) 47  Resp: 20  Temp: (!) 97.1 F (36.2 C)  SpO2: 98%

## 2020-02-02 ENCOUNTER — Ambulatory Visit: Payer: Medicare Other | Admitting: Radiation Oncology

## 2020-02-02 DIAGNOSIS — C7801 Secondary malignant neoplasm of right lung: Secondary | ICD-10-CM | POA: Insufficient documentation

## 2020-02-02 DIAGNOSIS — C670 Malignant neoplasm of trigone of bladder: Secondary | ICD-10-CM | POA: Insufficient documentation

## 2020-02-07 ENCOUNTER — Other Ambulatory Visit: Payer: Self-pay

## 2020-02-07 ENCOUNTER — Encounter: Payer: Self-pay | Admitting: Oncology

## 2020-02-07 ENCOUNTER — Ambulatory Visit
Admission: RE | Admit: 2020-02-07 | Discharge: 2020-02-07 | Disposition: A | Discharge status: Home / Self Care | Payer: Medicare Other | Source: Ambulatory Visit | Attending: Radiation Oncology | Admitting: Radiation Oncology

## 2020-02-07 DIAGNOSIS — C7801 Secondary malignant neoplasm of right lung: Secondary | ICD-10-CM | POA: Diagnosis not present

## 2020-02-07 DIAGNOSIS — C679 Malignant neoplasm of bladder, unspecified: Secondary | ICD-10-CM

## 2020-02-07 DIAGNOSIS — C670 Malignant neoplasm of trigone of bladder: Secondary | ICD-10-CM | POA: Diagnosis not present

## 2020-02-09 DIAGNOSIS — C7801 Secondary malignant neoplasm of right lung: Secondary | ICD-10-CM | POA: Diagnosis not present

## 2020-02-11 NOTE — Progress Notes (Signed)
  Radiation Oncology         [phone]) [phone] ________________________________  Name: Robert Lowery MRN: [id number]  Date: 02/07/2020  DOB: [birth year]  STEREOTACTIC BODY RADIOTHERAPY SIMULATION AND TREATMENT PLANNING NOTE    ICD-10-CM   1. Malignant neoplasm metastatic to right lung ([place])  C78.01   2. Malignant neoplasm of urinary bladder, unspecified site [facility])  C67.9     DIAGNOSIS:   64 y.o. 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  Identity was confirmed.  All relevant records and images related to the planned course of therapy were reviewed.  The patient freely provided informed written consent to proceed with treatment after reviewing the details related to the planned course of therapy. The consent form was witnessed and verified by the simulation staff.  Then, the patient was set-up in a stable reproducible  supine position for radiation therapy.  A BodyFix immobilization pillow was fabricated for reproducible positioning.  Then I personally applied the abdominal compression paddle to limit respiratory excursion.  4D respiratoy motion management CT images were obtained.  Surface markings were placed.  The CT images were loaded into the planning software.  Then, using Cine, MIP, and standard views, the internal target volume (ITV) and planning target volumes (PTV) were delinieated, and avoidance structures were contoured.  Treatment planning then occurred.  The radiation prescription was entered and confirmed.  A total of two complex treatment devices were fabricated in the form of the BodyFix immobilization pillow and a neck accuform cushion.  I have requested : 3D Simulation  I have requested a DVH of the following structures: Heart, Lungs, Esophagus, Chest Wall, Brachial Plexus, Major Blood Vessels, and targets.  SPECIAL TREATMENT PROCEDURE:  The planned course of therapy  using radiation constitutes a special treatment procedure. Special care is required in the management of this patient for the following reasons. This treatment constitutes a Special Treatment Procedure for the following reason: [ High dose per fraction requiring special monitoring for increased toxicities of treatment including daily imaging..  The special nature of the planned course of radiotherapy will require increased physician supervision and oversight to ensure patient's safety with optimal treatment outcomes.  RESPIRATORY MOTION MANAGEMENT SIMULATION:  In order to account for effect of respiratory motion on target structures and other organs in the planning and delivery of radiotherapy, this patient underwent respiratory motion management simulation.  To accomplish this, when the patient was brought to the CT simulation planning suite, 4D respiratoy motion management CT images were obtained.  The CT images were loaded into the planning software.  Then, using a variety of tools including Cine, MIP, and standard views, the target volume and planning target volumes (PTV) were delineated.  Avoidance structures were contoured.  Treatment planning then occurred.  Dose volume histograms were generated and reviewed for each of the requested structure.  The resulting plan was carefully reviewed and approved today.  PLAN:  The patient will receive 50 Gy in 5 fractions to the staple line following wedge resection for positive margin.  ________________________________  Sheral Apley. Tammi Klippel, M.D.

## 2020-02-13 ENCOUNTER — Ambulatory Visit
Admission: RE | Admit: 2020-02-13 | Discharge: 2020-02-13 | Disposition: A | Discharge status: Home / Self Care | Payer: Medicare Other | Source: Ambulatory Visit | Attending: Radiation Oncology | Admitting: Radiation Oncology

## 2020-02-13 ENCOUNTER — Other Ambulatory Visit: Payer: Self-pay

## 2020-02-13 DIAGNOSIS — C670 Malignant neoplasm of trigone of bladder: Secondary | ICD-10-CM | POA: Diagnosis not present

## 2020-02-13 DIAGNOSIS — C7801 Secondary malignant neoplasm of right lung: Secondary | ICD-10-CM | POA: Diagnosis not present

## 2020-02-14 ENCOUNTER — Ambulatory Visit: Payer: Medicare Other | Attending: Internal Medicine

## 2020-02-14 DIAGNOSIS — Z23 Encounter for immunization: Secondary | ICD-10-CM | POA: Insufficient documentation

## 2020-02-14 NOTE — Progress Notes (Signed)
   Covid-19 Vaccination Clinic  Name:  Kadir Azucena    MRN: [id number] DOB: [birth year]  02/14/2020  Mr. Noboa was observed post Covid-19 immunization for 15 minutes without incident. He was provided with Vaccine Information Sheet and instruction to access the V-Safe system.   Mr. Dolata was instructed to call 911 with any severe reactions post vaccine: Marland Kitchen Difficulty breathing  . Swelling of face and throat  . A fast heartbeat  . A bad rash all over body  . Dizziness and weakness   Immunizations Administered    Name Date Dose VIS Date Route   Pfizer COVID-19 Vaccine 02/14/2020 11:15 AM 0.3 mL 11/24/2019 Intramuscular   Manufacturer: [place]   Lot: [id number]   [place]: [id number]

## 2020-02-15 ENCOUNTER — Other Ambulatory Visit: Payer: Self-pay

## 2020-02-15 ENCOUNTER — Ambulatory Visit
Admission: RE | Admit: 2020-02-15 | Discharge: 2020-02-15 | Disposition: A | Discharge status: Home / Self Care | Payer: Medicare Other | Source: Ambulatory Visit | Attending: Radiation Oncology | Admitting: Radiation Oncology

## 2020-02-15 DIAGNOSIS — C7801 Secondary malignant neoplasm of right lung: Secondary | ICD-10-CM | POA: Diagnosis not present

## 2020-02-16 ENCOUNTER — Ambulatory Visit: Payer: Medicare Other

## 2020-02-19 ENCOUNTER — Ambulatory Visit
Admission: RE | Admit: 2020-02-19 | Discharge: 2020-02-19 | Disposition: A | Discharge status: Home / Self Care | Payer: Medicare Other | Source: Ambulatory Visit | Attending: Radiation Oncology | Admitting: Radiation Oncology

## 2020-02-19 ENCOUNTER — Other Ambulatory Visit: Payer: Self-pay

## 2020-02-19 DIAGNOSIS — C7801 Secondary malignant neoplasm of right lung: Secondary | ICD-10-CM | POA: Diagnosis not present

## 2020-02-20 ENCOUNTER — Ambulatory Visit: Payer: Medicare Other

## 2020-02-21 ENCOUNTER — Ambulatory Visit
Admission: RE | Admit: 2020-02-21 | Discharge: 2020-02-21 | Disposition: A | Discharge status: Home / Self Care | Payer: Medicare Other | Source: Ambulatory Visit | Attending: Radiation Oncology | Admitting: Radiation Oncology

## 2020-02-21 ENCOUNTER — Other Ambulatory Visit: Payer: Self-pay

## 2020-02-21 DIAGNOSIS — C7801 Secondary malignant neoplasm of right lung: Secondary | ICD-10-CM | POA: Diagnosis not present

## 2020-02-23 ENCOUNTER — Encounter: Payer: Self-pay | Admitting: Radiation Oncology

## 2020-02-23 ENCOUNTER — Ambulatory Visit
Admission: RE | Admit: 2020-02-23 | Discharge: 2020-02-23 | Disposition: A | Discharge status: Home / Self Care | Payer: Medicare Other | Source: Ambulatory Visit | Attending: Radiation Oncology | Admitting: Radiation Oncology

## 2020-02-23 ENCOUNTER — Other Ambulatory Visit: Payer: Self-pay

## 2020-02-23 DIAGNOSIS — C7801 Secondary malignant neoplasm of right lung: Secondary | ICD-10-CM | POA: Diagnosis not present

## 2020-02-27 NOTE — Progress Notes (Signed)
  Radiation Oncology         [phone]) [phone] ________________________________  Name: Robert Lowery MRN: [id number]  Date: 02/23/2020  DOB: 13-Nov-1956  End of Treatment Note  Diagnosis:    64 y.o. 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  Volumetric arc fields were employed to deliver 6 MV X-rays.  Image guidance was performed with per fraction cone beam CT prior to treatment under personal MD supervision.  Immobilization was achieved using BodyFix Pillow.  Narrative: The patient tolerated radiation treatment relatively well.     Plan: The patient has completed radiation treatment. The patient will return to radiation oncology clinic for routine followup in one month. I advised them to call or return sooner if they have any questions or concerns related to their recovery or treatment. ________________________________  Sheral Apley. Tammi Klippel, M.D.

## 2020-03-15 ENCOUNTER — Other Ambulatory Visit: Payer: Self-pay

## 2020-03-15 ENCOUNTER — Inpatient Hospital Stay: Payer: Medicare Other | Attending: Oncology

## 2020-03-15 ENCOUNTER — Encounter: Payer: Self-pay | Admitting: Oncology

## 2020-03-15 DIAGNOSIS — Z452 Encounter for adjustment and management of vascular access device: Secondary | ICD-10-CM | POA: Insufficient documentation

## 2020-03-15 DIAGNOSIS — Z95828 Presence of other vascular implants and grafts: Secondary | ICD-10-CM

## 2020-03-15 DIAGNOSIS — C670 Malignant neoplasm of trigone of bladder: Secondary | ICD-10-CM | POA: Diagnosis not present

## 2020-03-15 MED ORDER — HEPARIN SOD (PORK) LOCK FLUSH 100 UNIT/ML IV SOLN
500.0000 [IU] | Freq: Once | INTRAVENOUS | Status: AC
  Administered 2020-03-15: 500 [IU]
  Filled 2020-03-15: qty 5

## 2020-03-15 MED ORDER — SODIUM CHLORIDE 0.9% FLUSH
10.0000 mL | Freq: Once | INTRAVENOUS | Status: AC
  Administered 2020-03-15: 10 mL
  Filled 2020-03-15: qty 10

## 2020-03-28 ENCOUNTER — Other Ambulatory Visit: Payer: Self-pay

## 2020-03-28 ENCOUNTER — Encounter: Payer: Self-pay | Admitting: Urology

## 2020-03-28 ENCOUNTER — Ambulatory Visit
Admission: RE | Admit: 2020-03-28 | Discharge: 2020-03-28 | Disposition: A | Discharge status: Home / Self Care | Payer: Medicare Other | Source: Ambulatory Visit | Attending: Urology | Admitting: Urology

## 2020-03-28 DIAGNOSIS — C7801 Secondary malignant neoplasm of right lung: Secondary | ICD-10-CM

## 2020-03-28 NOTE — Progress Notes (Signed)
Radiation Oncology         [phone] ________________________________  Name: Robert Lowery MRN: [id number]  Date: 03/28/2020  DOB: September 17, 1956  Post Treatment Note  CC: [name], MD  Wyatt Portela, MD  Diagnosis:   64 y.o. 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  The patient was notified in advance and gave permission to proceed with this visit format. He tolerated radiation treatment relatively well with no adverse side effects.                                On review of systems, the patient states that he is doing very well in general.  He specifically denies chest pain, increased shortness of breath, productive cough, hemoptysis, dysphagia or significant fatigue.  He reports a healthy appetite and is maintaining his weight.  Overall, he is quite pleased with his progress to date.  ALLERGIES:  has No Known Allergies.  Meds: Current Outpatient Medications  Medication Sig Dispense Refill  . Ascorbic Acid (VITAMIN C) 1000 MG tablet Take 1,000 mg by mouth daily.    Marland Kitchen aspirin 81 MG chewable tablet Chew 1 tablet (81 mg total) by mouth daily.    . cholecalciferol (VITAMIN D3) 25 MCG (1000 UT) tablet Take 1,000 Units by mouth daily.    Marland Kitchen CINNAMON PO Take 1,000 mg by mouth daily.    . lansoprazole (PREVACID) 15 MG capsule Take 15 mg by mouth daily before breakfast.    . lidocaine-prilocaine (EMLA) cream APPLY ONE APPLICATION TOPICALLY AS NEEDED 30 g 0  . Multiple Vitamin (MULTIVITAMIN WITH MINERALS) TABS tablet Take 1 tablet by mouth daily.    . prochlorperazine  (COMPAZINE) 10 MG tablet TAKE ONE TABLET BY MOUTH EVERY SIX HOURS AS NEEDED FOR NAUSEA OR VOMITING 30 tablet 0  . promethazine (PHENERGAN) 25 MG tablet Take 1 tablet (25 mg total) by mouth every 6 (six) hours as needed for nausea or vomiting. 50 tablet 0   No current facility-administered medications for this encounter.    Physical Findings:  vitals were not taken for this visit.   /Unable to assess due to telephone follow-up visit format.  Lab Findings: Lab Results  Component Value Date   WBC 3.7 (L) 12/26/2019   HGB 10.6 (L) 12/26/2019   HCT 32.2 (L) 12/26/2019   MCV 91.7 12/26/2019   PLT 201 12/26/2019     Radiographic Findings: No results found.  Impression/Plan: 1. 64 y.o. 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 He appears to have recovered well from the effects of his recent adjuvant postop SBRT treatment and is currently without complaint.    He has a scheduled follow-up visit with Dr. Alen Blew on May 22, 2020.  We discussed that while we are happy to continue to participate in his care if clinically indicated, at this point, we will plan to see him back on an as-needed basis.  He will continue in routine follow-up under the care and direction of Dr.  Shadad and Dr. Jeffie Pollock for continued monitoring and management of his systemic disease.  He states his understanding and is in agreement and comfortable with the stated plan.  He knows to call at anytime with any questions or concerns related to his previous radiotherapy.    Nicholos Johns, PA-C

## 2020-05-15 ENCOUNTER — Other Ambulatory Visit: Payer: Medicare Other

## 2020-05-22 ENCOUNTER — Inpatient Hospital Stay: Payer: Medicare Other

## 2020-05-22 ENCOUNTER — Inpatient Hospital Stay: Payer: Medicare Other | Attending: Oncology

## 2020-05-22 ENCOUNTER — Encounter (HOSPITAL_COMMUNITY)
Admission: RE | Admit: 2020-05-22 | Discharge: 2020-05-22 | Disposition: A | Discharge status: Home / Self Care | Payer: Medicare Other | Source: Ambulatory Visit | Attending: Oncology | Admitting: Oncology

## 2020-05-22 ENCOUNTER — Other Ambulatory Visit: Payer: Self-pay

## 2020-05-22 ENCOUNTER — Ambulatory Visit: Payer: Medicare Other | Admitting: Oncology

## 2020-05-22 DIAGNOSIS — C670 Malignant neoplasm of trigone of bladder: Secondary | ICD-10-CM | POA: Insufficient documentation

## 2020-05-22 DIAGNOSIS — D6481 Anemia due to antineoplastic chemotherapy: Secondary | ICD-10-CM | POA: Insufficient documentation

## 2020-05-22 DIAGNOSIS — Z7982 Long term (current) use of aspirin: Secondary | ICD-10-CM | POA: Diagnosis not present

## 2020-05-22 DIAGNOSIS — C7800 Secondary malignant neoplasm of unspecified lung: Secondary | ICD-10-CM | POA: Insufficient documentation

## 2020-05-22 DIAGNOSIS — Z79899 Other long term (current) drug therapy: Secondary | ICD-10-CM | POA: Insufficient documentation

## 2020-05-22 DIAGNOSIS — Z9221 Personal history of antineoplastic chemotherapy: Secondary | ICD-10-CM | POA: Insufficient documentation

## 2020-05-22 DIAGNOSIS — Z923 Personal history of irradiation: Secondary | ICD-10-CM | POA: Diagnosis not present

## 2020-05-22 DIAGNOSIS — T451X5A Adverse effect of antineoplastic and immunosuppressive drugs, initial encounter: Secondary | ICD-10-CM | POA: Insufficient documentation

## 2020-05-22 DIAGNOSIS — R110 Nausea: Secondary | ICD-10-CM

## 2020-05-22 DIAGNOSIS — D649 Anemia, unspecified: Secondary | ICD-10-CM

## 2020-05-22 DIAGNOSIS — C679 Malignant neoplasm of bladder, unspecified: Secondary | ICD-10-CM

## 2020-05-22 DIAGNOSIS — Z95828 Presence of other vascular implants and grafts: Secondary | ICD-10-CM

## 2020-05-22 LAB — CBC WITH DIFFERENTIAL (CANCER CENTER ONLY)
Abs Immature Granulocytes: 0 10*3/uL (ref 0.00–0.07)
Basophils Absolute: 0 10*3/uL (ref 0.0–0.1)
Basophils Relative: 0 %
Eosinophils Absolute: 0.1 10*3/uL (ref 0.0–0.5)
Eosinophils Relative: 2 %
HCT: 35.1 % — ABNORMAL LOW (ref 39.0–52.0)
Hemoglobin: 11.7 g/dL — ABNORMAL LOW (ref 13.0–17.0)
Immature Granulocytes: 0 %
Lymphocytes Relative: 47 %
Lymphs Abs: 1.4 10*3/uL (ref 0.7–4.0)
MCH: 31.1 pg (ref 26.0–34.0)
MCHC: 33.3 g/dL (ref 30.0–36.0)
MCV: 93.4 fL (ref 80.0–100.0)
Monocytes Absolute: 0.3 10*3/uL (ref 0.1–1.0)
Monocytes Relative: 8 %
Neutro Abs: 1.3 10*3/uL — ABNORMAL LOW (ref 1.7–7.7)
Neutrophils Relative %: 43 %
Platelet Count: 216 10*3/uL (ref 150–400)
RBC: 3.76 MIL/uL — ABNORMAL LOW (ref 4.22–5.81)
RDW: 13 % (ref 11.5–15.5)
WBC Count: 3.1 10*3/uL — ABNORMAL LOW (ref 4.0–10.5)
nRBC: 0 % (ref 0.0–0.2)

## 2020-05-22 LAB — CMP (CANCER CENTER ONLY)
ALT: 21 U/L (ref 0–44)
AST: 20 U/L (ref 15–41)
Albumin: 3.9 g/dL (ref 3.5–5.0)
Alkaline Phosphatase: 33 U/L — ABNORMAL LOW (ref 38–126)
Anion gap: 11 (ref 5–15)
BUN: 17 mg/dL (ref 8–23)
CO2: 26 mmol/L (ref 22–32)
Calcium: 9.1 mg/dL (ref 8.9–10.3)
Chloride: 107 mmol/L (ref 98–111)
Creatinine: 0.95 mg/dL (ref 0.61–1.24)
GFR, Est AFR Am: >60 mL/min (ref >60)
GFR, Estimated: >60 mL/min (ref >60)
Glucose, Bld: 90 mg/dL (ref 70–99)
Potassium: 4.2 mmol/L (ref 3.5–5.1)
Sodium: 144 mmol/L (ref 135–145)
Total Bilirubin: 0.4 mg/dL (ref 0.3–1.2)
Total Protein: 6.4 g/dL — ABNORMAL LOW (ref 6.5–8.1)

## 2020-05-22 LAB — SAMPLE TO BLOOD BANK

## 2020-05-22 LAB — MAGNESIUM: Magnesium: 2 mg/dL (ref 1.7–2.4)

## 2020-05-22 LAB — GLUCOSE, CAPILLARY: Glucose-Capillary: 94 mg/dL (ref 70–99)

## 2020-05-22 MED ORDER — HEPARIN SOD (PORK) LOCK FLUSH 100 UNIT/ML IV SOLN
500.0000 [IU] | Freq: Once | INTRAVENOUS | Status: AC
  Administered 2020-05-22: 500 [IU]
  Filled 2020-05-22: qty 5

## 2020-05-22 MED ORDER — SODIUM CHLORIDE 0.9% FLUSH
10.0000 mL | Freq: Once | INTRAVENOUS | Status: AC
  Administered 2020-05-22: 10 mL
  Filled 2020-05-22: qty 10

## 2020-05-22 MED ORDER — FLUDEOXYGLUCOSE F - 18 (FDG) INJECTION
6.6300 | Freq: Once | INTRAVENOUS | Status: AC
  Administered 2020-05-22: 6.63 via INTRAVENOUS

## 2020-05-27 ENCOUNTER — Inpatient Hospital Stay (HOSPITAL_BASED_OUTPATIENT_CLINIC_OR_DEPARTMENT_OTHER): Payer: Medicare Other | Admitting: Oncology

## 2020-05-27 ENCOUNTER — Other Ambulatory Visit: Payer: Self-pay

## 2020-05-27 VITALS — BP 131/66 | HR 46 | Temp 97.7°F | Resp 20 | Ht 64.0 in | Wt 130.0 lb

## 2020-05-27 DIAGNOSIS — C670 Malignant neoplasm of trigone of bladder: Secondary | ICD-10-CM | POA: Diagnosis not present

## 2020-05-27 NOTE — Progress Notes (Signed)
Hematology and Oncology Follow Up Visit  Robert Lowery [id number] 04-21-1956 64 y.o. 05/27/2020 8:31 AM Robert Lowery, MDMoreira, Robert Manner, MD   Principle Diagnosis: 64 year old man with stage IV bladder cancer with documented pulmonary metastasis diagnosed in 2019.    Prior Therapy:   He is status post cystectomy done in July 2019.  Surgery performed in [place]. He is status post lung biopsy completed on June 2 of 2020. Gemcitabine and cisplatin chemotherapy cycle 1 started on 06/02/2019.  He completed 6 cycles of therapy on September 21, 2019.  He had a near complete response with a residual pulmonary nodule. He is status post wedge resection of right upper lobe residual tumor using a right thoracotomy completed on October 30, 2019.  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 He is status post radiation therapy to the resected right upper lung.  He received 50 Gray in 5 fractions in March 2021.    Current therapy: Active surveillance.    Interim History: Mr. Salzwedel presents today for a repeat evaluation.  Since the last visit, he reports no major changes in his health.  He continues to be active and attends activities of daily living.  Continues to exercise regularly including playing golf.  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  He has been prescribed gabapentin although he has not taken it.               Medications: Reviewed without changes. Current Outpatient Medications  Medication Sig Dispense Refill  . Ascorbic Acid (VITAMIN C) 1000 MG tablet Take 1,000 mg by mouth daily.    Marland Kitchen aspirin 81 MG chewable tablet Chew 1 tablet (81 mg total) by mouth daily.    . cholecalciferol (VITAMIN D3) 25 MCG (1000 UT) tablet Take 1,000 Units by mouth daily.    Marland Kitchen CINNAMON PO Take 1,000 mg by mouth daily.    . lansoprazole (PREVACID) 15 MG capsule Take 15 mg by mouth daily before breakfast.    .  lidocaine-prilocaine (EMLA) cream APPLY ONE APPLICATION TOPICALLY AS NEEDED 30 g 0  . Multiple Vitamin (MULTIVITAMIN WITH MINERALS) TABS tablet Take 1 tablet by mouth daily.    . prochlorperazine (COMPAZINE) 10 MG tablet TAKE ONE TABLET BY MOUTH EVERY SIX HOURS AS NEEDED FOR NAUSEA OR VOMITING 30 tablet 0  . promethazine (PHENERGAN) 25 MG tablet Take 1 tablet (25 mg total) by mouth every 6 (six) hours as needed for nausea or vomiting. 50 tablet 0   No current facility-administered medications for this visit.     Allergies: No Known Allergies   Physical exam Blood pressure 131/66, pulse (!) 46, temperature 97.7 F (36.5 C), temperature source Temporal, resp. rate 20, height '5\' 4"'$  (1.626 m), weight 130 lb (59 kg), SpO2 100 %.   ECOG 0  General appearance: Comfortable appearing without any discomfort Head: Normocephalic without any trauma Oropharynx: Mucous membranes are moist and pink without any thrush or ulcers. Eyes: Pupils are equal and round reactive to light. Lymph nodes: No cervical, supraclavicular, inguinal or axillary lymphadenopathy.   Heart:regular rate and rhythm.  S1 and S2 without leg edema. Lung: Clear without any rhonchi or wheezes.  No dullness to percussion. Abdomin: Soft, nontender, nondistended with good bowel sounds.  No hepatosplenomegaly. Musculoskeletal: No joint deformity or effusion.  Full range of motion noted. Neurological: No deficits noted on motor, sensory and deep tendon reflex exam. Skin: No petechial rash or dryness.  Appeared moist.  .       Lab Results: Lab Results  Component Value Date   WBC 3.1 (L) 05/22/2020   HGB 11.7 (L) 05/22/2020   HCT 35.1 (L) 05/22/2020   MCV 93.4 05/22/2020   PLT 216 05/22/2020     Chemistry      Component Value Date/Time   NA 144 05/22/2020 1027   K 4.2 05/22/2020 1027   CL 107 05/22/2020 1027   CO2 26 05/22/2020 1027   BUN 17 05/22/2020 1027   CREATININE 0.95 05/22/2020 1027      Component Value  Date/Time   CALCIUM 9.1 05/22/2020 1027   ALKPHOS 33 (L) 05/22/2020 1027   AST 20 05/22/2020 1027   ALT 21 05/22/2020 1027   BILITOT 0.4 05/22/2020 1027       IMPRESSION: Status post cystoprostatectomy and suspected distal right ureteral section.  Status post right upper lobe wedge resection.  No findings suspicious for recurrent or metastatic disease.  Sequela of granulomatous disease, as above. Hypermetabolic lymph nodes in the mediastinum/bilateral hilar regions are favored to be reactive in this setting.    Impression and Plan:   64 year old man with  1.  Stage IV bladder cancer with documented lung metastasis diagnosed in 2019.    He is status post therapy outlined above and currently on active surveillance.  PET scan obtained on May 22, 2020 was personally reviewed and discussed with the patient today.  He has no evidence of metastatic disease noted at this time.  The natural course of this disease and risk of relapse was assessed.  Alternative treatment options would include switch to maintenance with immunotherapy versus continued active surveillance.  Risks and benefits of both approaches were reviewed at this time.  Given the fact that he has no measurable disease, I have opted to withhold any additional therapy despite the possible evidence of PD-L1 inhibition in the adjuvant setting for resected bladder cancer may provide additional benefit.  We have opted to use this agent for salvage approach in the future.   2.  Leukocytopenia: Likely a sequela from chemotherapy and radiation treatment.  Absolute neutrophil count remains adequate at this time.  I anticipate recovery over period of time.  3.  Anemia: Related to chemotherapy and radiation.  Hemoglobin is improving.  He does not require any growth factor support or intervention.  4. IV access:Port-A-Cath remains in place and I recommended continuing for the time being.   5. Follow-up: In 6 months for  repeat evaluation including imaging studies.   30  minutes spent on this encounter.  The time was dedicated to updating his disease status, reviewing imaging studies, laboratory data as well as future plan of care discussion.      Zola Button, MD 6/14/20218:31 AM

## 2020-05-28 ENCOUNTER — Telehealth: Payer: Self-pay | Admitting: Oncology

## 2020-05-28 NOTE — Telephone Encounter (Signed)
Scheduled appt per 6/14 los.  Left a vm of the appt date and time.

## 2020-06-28 IMAGING — PT NM PET TUM IMG RESTAG (PS) SKULL BASE T - THIGH
7 series · 25 of 25 positions shown · non-contrast
Comparison: 01/10/2020

CLINICAL DATA: Subsequent treatment strategy for bladder cancer,
diagnosed 1077, status post TURB in 1077. History of lung cancer,
status post resection in 5151, status post chemotherapy.

EXAM:
NUCLEAR MEDICINE PET SKULL BASE TO THIGH
TECHNIQUE: 6.6 mCi F-18 FDG was injected intravenously. Full-ring PET imaging
was performed from the skull base to thigh after the radiotracer. CT
data was obtained and used for attenuation correction and anatomic
localization.
Fasting blood glucose: 94 mg/dl

[Series 3: pet sk_thigh ac · axial · 5.0mm · 4.07mm/px · z∈[-839,+13]mm · 5 of 214 slices shown]
[im 1/214]
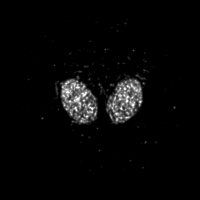
[im 54/214]
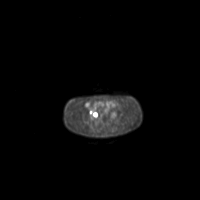
[im 107/214]
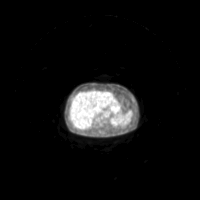
[im 160/214]
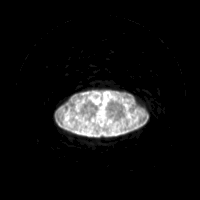
[im 214/214]
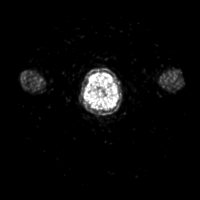

[Series 4: ct sk_thigh 5.0 b31f · axial · 5.0mm · 0.88mm/px · z∈[-839,+13]mm · 5 of 213 slices shown]
[im 1/213]
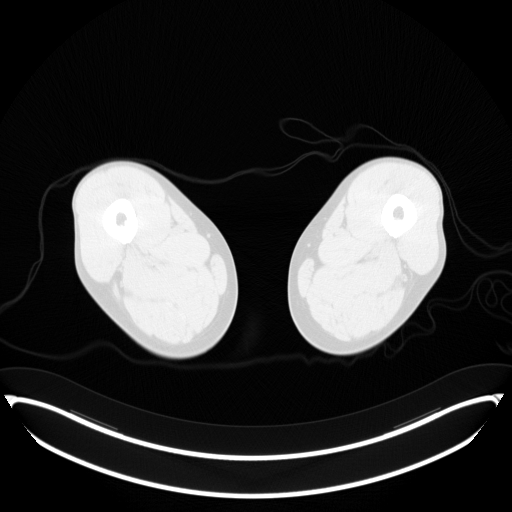
[im 54/213]
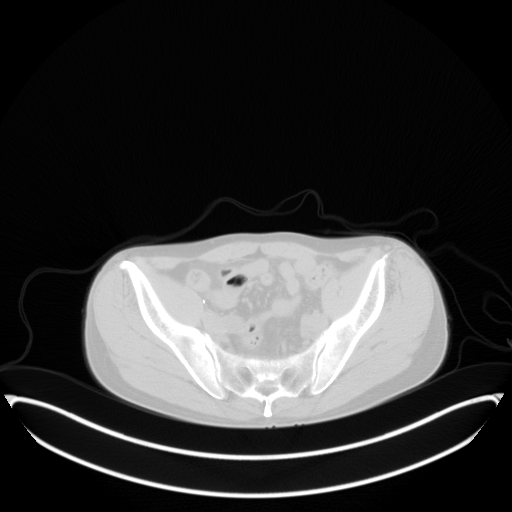
[im 107/213]
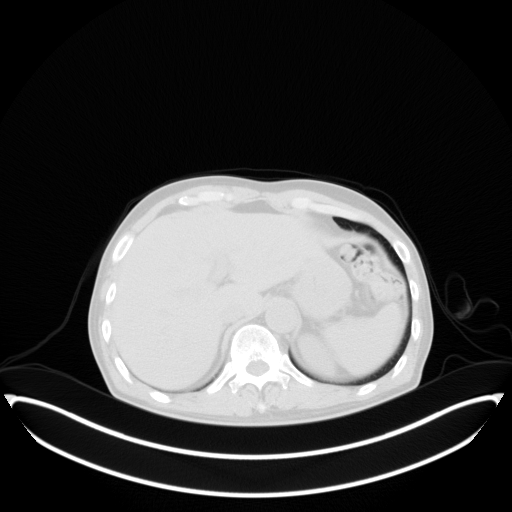
[im 160/213]
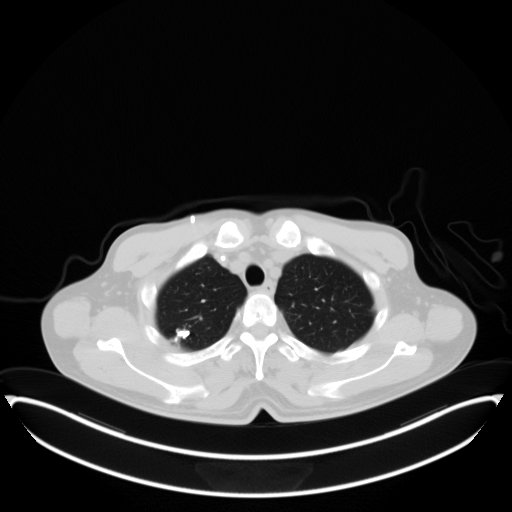
[im 213/213  brain]
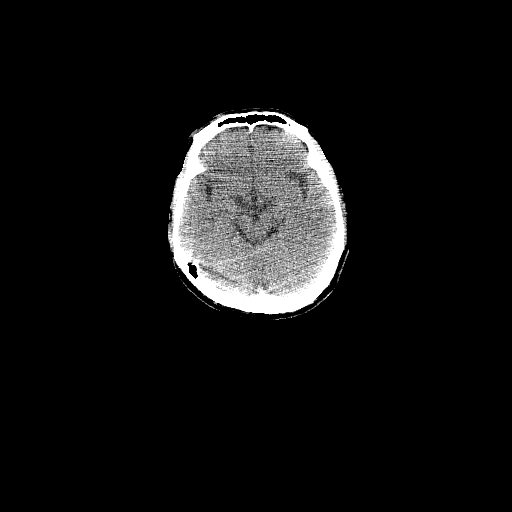

[Series 5: pet sk_thigh nac · axial · 5.0mm · 4.07mm/px · z∈[-839,+13]mm · 5 of 214 slices shown]
[im 1/214]
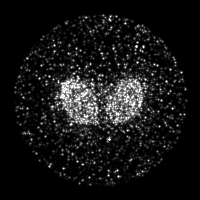
[im 54/214]
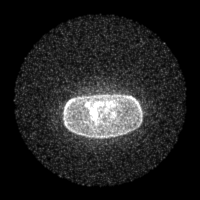
[im 107/214]
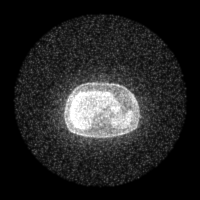
[im 160/214]
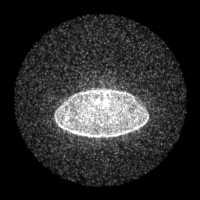
[im 214/214]
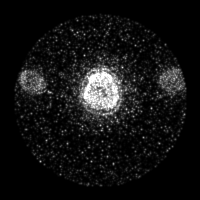

[Series 8: ct sk_thigh 5.0 (id) lung_bone · axial · 5.0mm · 0.66mm/px · z∈[-445,-149]mm · 2 of 75 slices shown]
[im 1/75  bone]
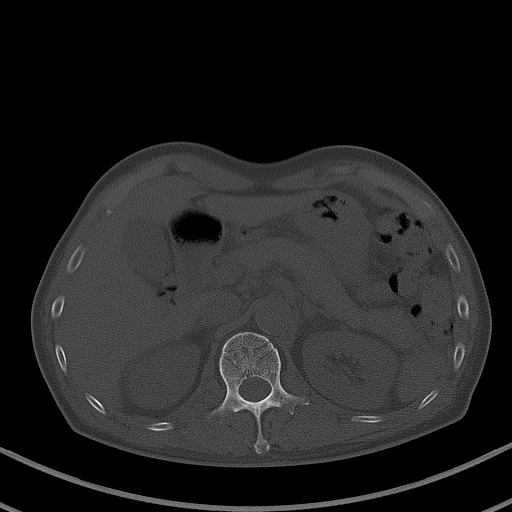
[im 75/75  bone]
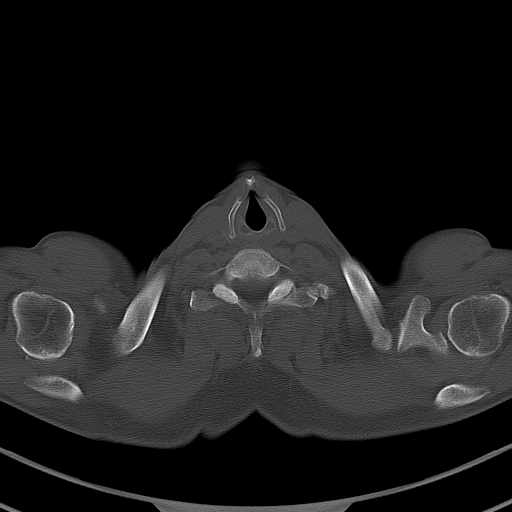

[Series 603: range-ct sk_thigh 5.0 (id)<alpha range> · 2 of 72 slices shown (1 of 2)]
[im 1/72]
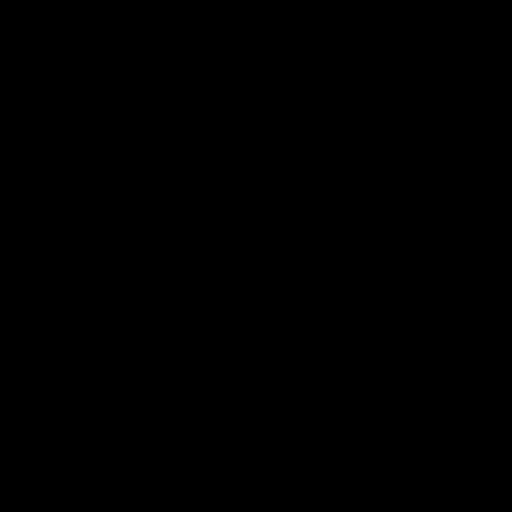
[im 72/72]
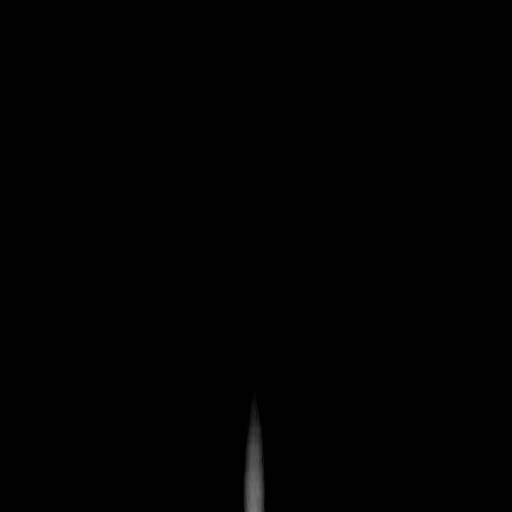

[Series 604: mip range 3 · coronal · 1.77mm/px · 1 of 32 slices shown]
[im 1/32]
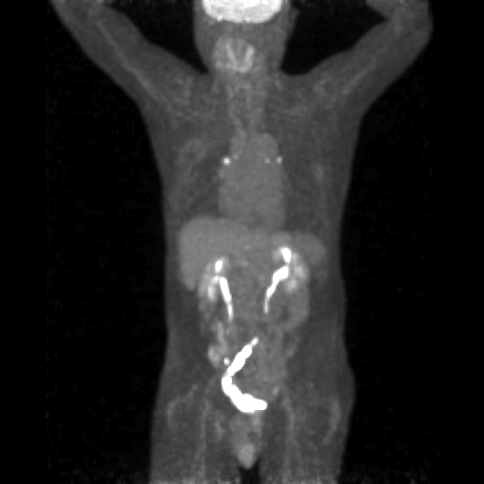

[Series 605: range-ct sk_thigh 5.0 (id)<alpha range> · 5 of 201 slices shown (2 of 2)]
[im 1/201]
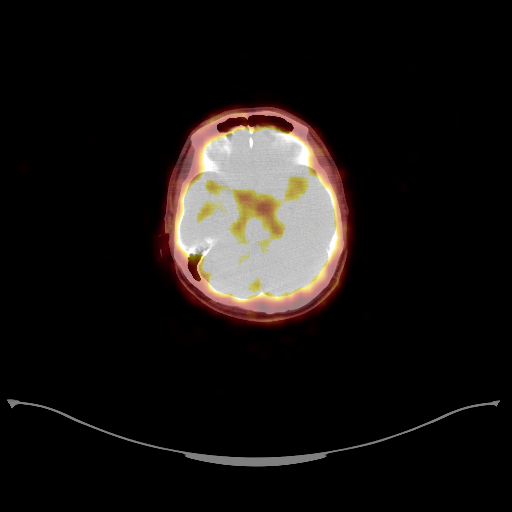
[im 51/201]
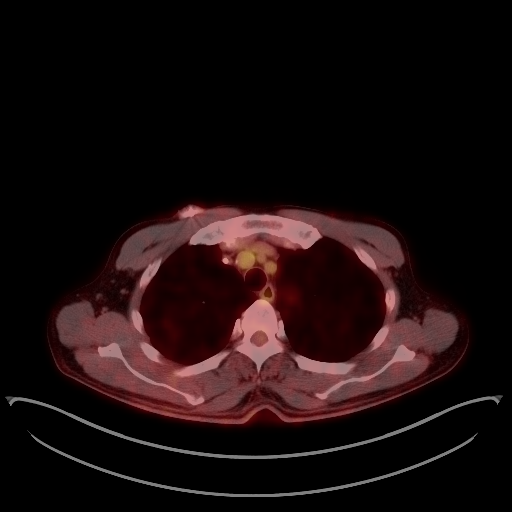
[im 101/201]
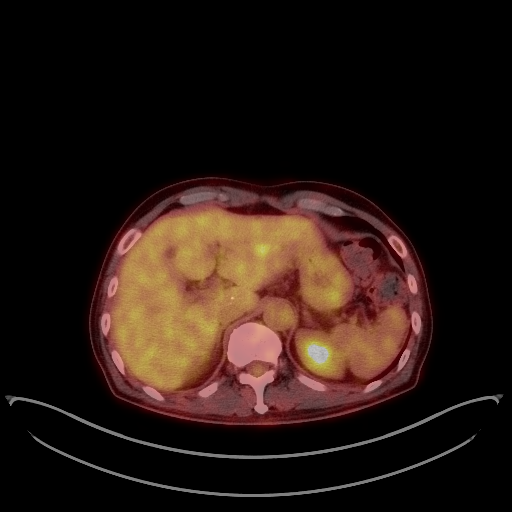
[im 151/201]
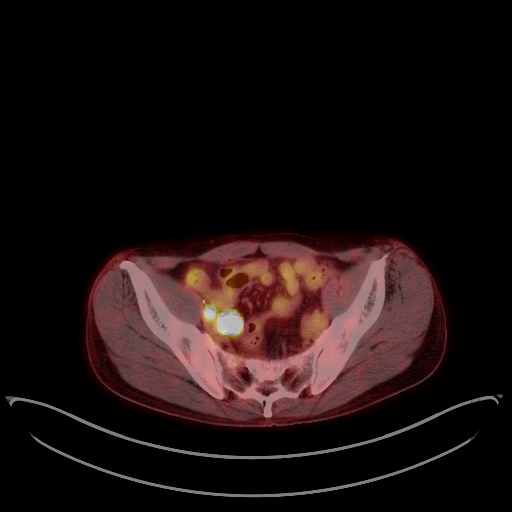
[im 201/201]
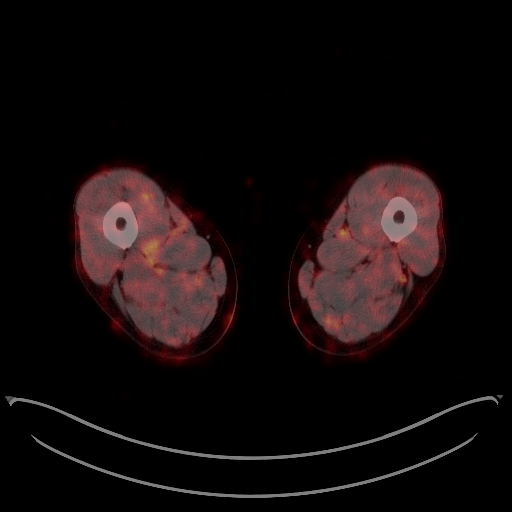

[25 of 25 positions shown; findings below may reference images not displayed]

FINDINGS: Mediastinal blood pool activity: SUV max

Liver activity: SUV max NA

NECK: No hypermetabolic cervical lymphadenopathy.

Incidental CT findings: none

CHEST: Calcified mediastinal lymphadenopathy with hypermetabolic
mediastinal/hilar nodes, max SUV 9.7 in the right hilar region,
previously 13.8. This appearance is favored to be reactive, related
to granulomatous disease.

Status post right upper lobe wedge resection. Calcified granulomata
in the right upper and lower lobes. No hypermetabolic pulmonary
nodules.

Right chest port terminates the cavoatrial junction.

Incidental CT findings: Mild atherosclerotic calcifications of the
aortic arch.

ABDOMEN/PELVIS: No abnormal hypermetabolism in the liver, spleen,
pancreas, or adrenal glands.

Status post cystectomy with orthotopic neobladder. Suspected distal
right ureteral resection. Excretory hypermetabolism in the distal
right ureter and neobladder. No findings on CT which are suspicious
for residual/recurrent neoplasm.

No hypermetabolic abdominopelvic lymphadenopathy.

Incidental CT findings: Scattered hepatic cysts measuring up to
cm in the posterior right hepatic lobe. Mild atherosclerotic
calcifications the abdominal aorta and branch vessels. Status post
prostatectomy.

SKELETON: No focal hypermetabolic activity to suggest skeletal
metastasis.

Incidental CT findings: Degenerative changes of the visualized
thoracolumbar spine.
IMPRESSION: Status post cystoprostatectomy and suspected distal right ureteral
section.

Status post right upper lobe wedge resection.

No findings suspicious for recurrent or metastatic disease.

Sequela of granulomatous disease, as above. Hypermetabolic lymph
nodes in the mediastinum/bilateral hilar regions are favored to be
reactive in this setting.

## 2020-07-26 ENCOUNTER — Inpatient Hospital Stay: Payer: Medicare Other | Attending: Oncology

## 2020-07-26 ENCOUNTER — Other Ambulatory Visit: Payer: Self-pay

## 2020-07-26 DIAGNOSIS — Z452 Encounter for adjustment and management of vascular access device: Secondary | ICD-10-CM | POA: Diagnosis present

## 2020-07-26 DIAGNOSIS — Z95828 Presence of other vascular implants and grafts: Secondary | ICD-10-CM

## 2020-07-26 DIAGNOSIS — C670 Malignant neoplasm of trigone of bladder: Secondary | ICD-10-CM | POA: Diagnosis present

## 2020-07-26 MED ORDER — SODIUM CHLORIDE 0.9% FLUSH
10.0000 mL | Freq: Once | INTRAVENOUS | Status: AC
  Administered 2020-07-26: 10 mL
  Filled 2020-07-26: qty 10

## 2020-07-26 MED ORDER — HEPARIN SOD (PORK) LOCK FLUSH 100 UNIT/ML IV SOLN
500.0000 [IU] | Freq: Once | INTRAVENOUS | Status: AC
  Administered 2020-07-26: 500 [IU]
  Filled 2020-07-26: qty 5

## 2020-09-25 ENCOUNTER — Inpatient Hospital Stay: Payer: Medicare Other | Attending: Oncology

## 2020-09-25 ENCOUNTER — Other Ambulatory Visit: Payer: Self-pay

## 2020-09-25 DIAGNOSIS — C670 Malignant neoplasm of trigone of bladder: Secondary | ICD-10-CM | POA: Insufficient documentation

## 2020-09-25 DIAGNOSIS — Z452 Encounter for adjustment and management of vascular access device: Secondary | ICD-10-CM | POA: Diagnosis not present

## 2020-09-25 DIAGNOSIS — Z95828 Presence of other vascular implants and grafts: Secondary | ICD-10-CM

## 2020-09-25 MED ORDER — HEPARIN SOD (PORK) LOCK FLUSH 100 UNIT/ML IV SOLN
500.0000 [IU] | Freq: Once | INTRAVENOUS | Status: AC
  Administered 2020-09-25: 500 [IU]
  Filled 2020-09-25: qty 5

## 2020-09-25 MED ORDER — SODIUM CHLORIDE 0.9% FLUSH
10.0000 mL | Freq: Once | INTRAVENOUS | Status: AC
  Administered 2020-09-25: 10 mL
  Filled 2020-09-25: qty 10

## 2020-09-25 NOTE — Patient Instructions (Signed)

## 2020-11-04 ENCOUNTER — Telehealth: Payer: Self-pay | Admitting: *Deleted

## 2020-11-04 NOTE — Telephone Encounter (Signed)
A male voice left VM requesting a call re: appointment and PET scan. Forwarded to collaborative RN for Dr. Alen Blew.

## 2020-11-05 ENCOUNTER — Telehealth: Payer: Self-pay

## 2020-11-05 NOTE — Telephone Encounter (Signed)
Return call to Pt's wife she stated Pt. Has an appointment with Dr Alen Blew on 11/21/20. She also stated Pt. Is suppose to get a Pet scan before his appointment on 11/21/20 and it hasn't been scheduled. High priority message sent to [facility] to see if Pt. Needs authorization. Pet scan appointment scheduled for 11/18/20 at 8 am Pt.'s wife informed of appointment time and date.

## 2020-11-14 ENCOUNTER — Ambulatory Visit: Payer: Medicare Other | Admitting: Oncology

## 2020-11-15 ENCOUNTER — Inpatient Hospital Stay: Payer: Medicare Other | Attending: Oncology

## 2020-11-15 ENCOUNTER — Other Ambulatory Visit: Payer: Self-pay

## 2020-11-15 ENCOUNTER — Inpatient Hospital Stay: Payer: Medicare Other

## 2020-11-15 VITALS — BP 122/52 | HR 51 | Resp 16

## 2020-11-15 DIAGNOSIS — C7801 Secondary malignant neoplasm of right lung: Secondary | ICD-10-CM | POA: Diagnosis not present

## 2020-11-15 DIAGNOSIS — Z452 Encounter for adjustment and management of vascular access device: Secondary | ICD-10-CM | POA: Diagnosis not present

## 2020-11-15 DIAGNOSIS — Z95828 Presence of other vascular implants and grafts: Secondary | ICD-10-CM

## 2020-11-15 DIAGNOSIS — D72819 Decreased white blood cell count, unspecified: Secondary | ICD-10-CM | POA: Diagnosis not present

## 2020-11-15 DIAGNOSIS — C670 Malignant neoplasm of trigone of bladder: Secondary | ICD-10-CM

## 2020-11-15 DIAGNOSIS — M5136 Other intervertebral disc degeneration, lumbar region: Secondary | ICD-10-CM | POA: Diagnosis not present

## 2020-11-15 DIAGNOSIS — Z79899 Other long term (current) drug therapy: Secondary | ICD-10-CM | POA: Insufficient documentation

## 2020-11-15 DIAGNOSIS — Z923 Personal history of irradiation: Secondary | ICD-10-CM | POA: Diagnosis not present

## 2020-11-15 DIAGNOSIS — Z9221 Personal history of antineoplastic chemotherapy: Secondary | ICD-10-CM | POA: Diagnosis not present

## 2020-11-15 DIAGNOSIS — M47816 Spondylosis without myelopathy or radiculopathy, lumbar region: Secondary | ICD-10-CM | POA: Insufficient documentation

## 2020-11-15 DIAGNOSIS — J8410 Pulmonary fibrosis, unspecified: Secondary | ICD-10-CM | POA: Insufficient documentation

## 2020-11-15 DIAGNOSIS — D649 Anemia, unspecified: Secondary | ICD-10-CM | POA: Insufficient documentation

## 2020-11-15 LAB — CBC WITH DIFFERENTIAL (CANCER CENTER ONLY)
Abs Immature Granulocytes: 0 10*3/uL (ref 0.00–0.07)
Basophils Absolute: 0 10*3/uL (ref 0.0–0.1)
Basophils Relative: 0 %
Eosinophils Absolute: 0.1 10*3/uL (ref 0.0–0.5)
Eosinophils Relative: 2 %
HCT: 34.2 % — ABNORMAL LOW (ref 39.0–52.0)
Hemoglobin: 11.5 g/dL — ABNORMAL LOW (ref 13.0–17.0)
Immature Granulocytes: 0 %
Lymphocytes Relative: 38 %
Lymphs Abs: 1.5 10*3/uL (ref 0.7–4.0)
MCH: 30.1 pg (ref 26.0–34.0)
MCHC: 33.6 g/dL (ref 30.0–36.0)
MCV: 89.5 fL (ref 80.0–100.0)
Monocytes Absolute: 0.3 10*3/uL (ref 0.1–1.0)
Monocytes Relative: 7 %
Neutro Abs: 2.1 10*3/uL (ref 1.7–7.7)
Neutrophils Relative %: 53 %
Platelet Count: 233 10*3/uL (ref 150–400)
RBC: 3.82 MIL/uL — ABNORMAL LOW (ref 4.22–5.81)
RDW: 13.2 % (ref 11.5–15.5)
WBC Count: 3.9 10*3/uL — ABNORMAL LOW (ref 4.0–10.5)
nRBC: 0 % (ref 0.0–0.2)

## 2020-11-15 LAB — CMP (CANCER CENTER ONLY)
ALT: 8 U/L (ref 0–44)
AST: 17 U/L (ref 15–41)
Albumin: 3.7 g/dL (ref 3.5–5.0)
Alkaline Phosphatase: 37 U/L — ABNORMAL LOW (ref 38–126)
Anion gap: 8 (ref 5–15)
BUN: 20 mg/dL (ref 8–23)
CO2: 24 mmol/L (ref 22–32)
Calcium: 8.8 mg/dL — ABNORMAL LOW (ref 8.9–10.3)
Chloride: 108 mmol/L (ref 98–111)
Creatinine: 0.87 mg/dL (ref 0.61–1.24)
GFR, Estimated: >60 mL/min (ref >60)
Glucose, Bld: 97 mg/dL (ref 70–99)
Potassium: 4.1 mmol/L (ref 3.5–5.1)
Sodium: 140 mmol/L (ref 135–145)
Total Bilirubin: 0.3 mg/dL (ref 0.3–1.2)
Total Protein: 6.5 g/dL (ref 6.5–8.1)

## 2020-11-15 MED ORDER — SODIUM CHLORIDE 0.9% FLUSH
10.0000 mL | Freq: Once | INTRAVENOUS | Status: AC
  Administered 2020-11-15: 10 mL
  Filled 2020-11-15: qty 10

## 2020-11-15 MED ORDER — HEPARIN SOD (PORK) LOCK FLUSH 100 UNIT/ML IV SOLN
500.0000 [IU] | Freq: Once | INTRAVENOUS | Status: AC
  Administered 2020-11-15: 500 [IU]
  Filled 2020-11-15: qty 5

## 2020-11-15 NOTE — Patient Instructions (Signed)

## 2020-11-18 ENCOUNTER — Other Ambulatory Visit: Payer: Self-pay

## 2020-11-18 ENCOUNTER — Ambulatory Visit (HOSPITAL_COMMUNITY)
Admission: RE | Admit: 2020-11-18 | Discharge: 2020-11-18 | Disposition: A | Discharge status: Home / Self Care | Payer: Medicare Other | Source: Ambulatory Visit | Attending: Oncology | Admitting: Oncology

## 2020-11-18 DIAGNOSIS — C670 Malignant neoplasm of trigone of bladder: Secondary | ICD-10-CM | POA: Insufficient documentation

## 2020-11-18 LAB — GLUCOSE, CAPILLARY: Glucose-Capillary: 93 mg/dL (ref 70–99)

## 2020-11-18 MED ORDER — FLUDEOXYGLUCOSE F - 18 (FDG) INJECTION
6.4000 | Freq: Once | INTRAVENOUS | Status: AC | PRN
  Administered 2020-11-18: 6.4 via INTRAVENOUS

## 2020-11-21 ENCOUNTER — Inpatient Hospital Stay (HOSPITAL_BASED_OUTPATIENT_CLINIC_OR_DEPARTMENT_OTHER): Payer: Medicare Other | Admitting: Oncology

## 2020-11-21 ENCOUNTER — Other Ambulatory Visit: Payer: Self-pay

## 2020-11-21 VITALS — BP 126/62 | HR 54 | Temp 96.4°F | Resp 18 | Wt 127.4 lb

## 2020-11-21 DIAGNOSIS — C670 Malignant neoplasm of trigone of bladder: Secondary | ICD-10-CM | POA: Diagnosis not present

## 2020-11-21 NOTE — Progress Notes (Signed)
Hematology and Oncology Follow Up Visit  Robert Lowery [id number] 10/24/1956 64 y.o. 11/21/2020 8:17 AM Robert Lowery, MDMoreira, Robert Manner, MD   Principle Diagnosis: 64 year old man with bladder cancer diagnosed in 2019.  He subsequently developed stage IV bladder cancer with documented pulmonary metastasis in 2020.  Prior Therapy:   He is status post cystectomy done in July 2019.  Surgery performed in [place]. He is status post lung biopsy completed on June 2 of 2020. Gemcitabine and cisplatin chemotherapy cycle 1 started on 06/02/2019.  He completed 6 cycles of therapy on September 21, 2019.  He had a near complete response with a residual pulmonary nodule. He is status post wedge resection of right upper lobe residual tumor using a right thoracotomy completed on October 30, 2019.  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 He is status post radiation therapy to the resected right upper lung.  He received 50 Gray in 5 fractions in March 2021.    Current therapy: Active surveillance.    Interim History: Robert Lowery returns today for repeat follow-up.  Since the last visit, he reports no major changes in his health.  He remains active continues to exercise regularly and plays golf.  He denies any shortness of breath, difficulty breathing or any complications related to his Port-A-Cath.               Medications: Unchanged on review. Current Outpatient Medications  Medication Sig Dispense Refill  . Ascorbic Acid (VITAMIN C) 1000 MG tablet Take 1,000 mg by mouth daily.    Marland Kitchen aspirin 81 MG chewable tablet Chew 1 tablet (81 mg total) by mouth daily.    . cholecalciferol (VITAMIN D3) 25 MCG (1000 UT) tablet Take 1,000 Units by mouth daily.    Marland Kitchen CINNAMON PO Take 1,000 mg by mouth daily.    . lansoprazole (PREVACID) 15 MG capsule Take 15 mg by mouth daily before breakfast.    . lidocaine-prilocaine (EMLA) cream APPLY ONE APPLICATION TOPICALLY AS NEEDED 30 g 0  .  Multiple Vitamin (MULTIVITAMIN WITH MINERALS) TABS tablet Take 1 tablet by mouth daily.    . prochlorperazine (COMPAZINE) 10 MG tablet TAKE ONE TABLET BY MOUTH EVERY SIX HOURS AS NEEDED FOR NAUSEA OR VOMITING 30 tablet 0  . promethazine (PHENERGAN) 25 MG tablet Take 1 tablet (25 mg total) by mouth every 6 (six) hours as needed for nausea or vomiting. 50 tablet 0   No current facility-administered medications for this visit.     Allergies: No Known Allergies   Physical exam Blood pressure 126/62, pulse (!) 54, temperature (!) 96.4 F (35.8 C), temperature source Tympanic, resp. rate 18, weight 127 lb 6.4 oz (57.8 kg), SpO2 100 %.    ECOG 0    General appearance: Alert, awake without any distress. Head: Atraumatic without abnormalities Oropharynx: Without any thrush or ulcers. Eyes: No scleral icterus. Lymph nodes: No lymphadenopathy noted in the cervical, supraclavicular, or axillary nodes Heart:regular rate and rhythm, without any murmurs or gallops.   Lung: Clear to auscultation without any rhonchi, wheezes or dullness to percussion. Abdomin: Soft, nontender without any shifting dullness or ascites. Musculoskeletal: No clubbing or cyanosis. Neurological: No motor or sensory deficits. Skin: No rashes or lesions.  .      Lab Results: Lab Results  Component Value Date   WBC 3.9 (L) 11/15/2020   HGB 11.5 (L) 11/15/2020   HCT 34.2 (L) 11/15/2020   MCV 89.5 11/15/2020   PLT 233 11/15/2020  Chemistry      Component Value Date/Time   NA 140 11/15/2020 0815   K 4.1 11/15/2020 0815   CL 108 11/15/2020 0815   CO2 24 11/15/2020 0815   BUN 20 11/15/2020 0815   CREATININE 0.87 11/15/2020 0815      Component Value Date/Time   CALCIUM 8.8 (L) 11/15/2020 0815   ALKPHOS 37 (L) 11/15/2020 0815   AST 17 11/15/2020 0815   ALT 8 11/15/2020 0815   BILITOT 0.3 11/15/2020 0815      IMPRESSION: 1. Bilateral hilar nodal activity along with activity in a small AP window  lymph node. Given the small size of the involved lymph nodes and the evidence of prior granulomatous disease, this could represent active/reactive benign granulomatous disease rather than malignancy, although the activity has moderately increased compared to 05/22/2020. None of the lymph nodes appear pathologically enlarged. 2. The activity along the palatine tonsils and tongue base is symmetric, but there is some polypoid irregularity in the right vallecula on the CT data which may warrant direct visualization by laryngoscopy. 3. Other imaging findings of potential clinical significance: Aortic Atherosclerosis (ICD10-I70.0). Prominent stool throughout the colon favors constipation. Lower lumbar spondylosis and degenerative disc disease. Prior right upper lobe wedge resection.     Impression and Plan:   64 year old man with  1.  Bladder cancer diagnosed in 2019.  He underwent a radical cystectomy subsequently developed stage IV disease with isolated pulmonary metastasis and currently has no active disease.  His disease status was updated today including review of his laboratory testing and PET scan obtained on 11/18/2020.  There is no clear-cut evidence of metastatic disease at this time or any disease relapse.  Treatment options were reviewed include continued active surveillance versus switch maintenance with immunotherapy.  We have deferred the option for any additional systemic therapy once he developed relapsed disease.    He is agreeable with this plan.  2.  Leukocytopenia: Resolving after exposure to chemotherapy with counts close to normal.   3.  Anemia: Continues to improve at this time and approaching baseline.  His anemia is related to previous chemotherapy and radiation exposure.  4. IV access:Risks and benefits of keeping Port-A-Cath discussed at this time.  I recommended keeping it for the time being and will flush every 2 months.  5.  Granulomatous disease: Mild  activity noted in pulmonary lymph nodes which we will monitor on subsequent scans.   6. Follow-up: every 2 months for Port-A-Cath flush and in 6 months for repeat evaluation and imaging studies.   30  minutes were dedicated to this visit.  Time was spent on reviewing laboratory data, disease status update, reviewing imaging studies and future plan of care discussion.      Zola Button, MD 12/9/20218:17 AM

## 2021-01-21 ENCOUNTER — Telehealth: Payer: Self-pay | Admitting: Oncology

## 2021-01-21 NOTE — Telephone Encounter (Signed)
Called patient regarding upcoming 02/10 appointment and future appointments, left a voicemail.

## 2021-01-24 ENCOUNTER — Inpatient Hospital Stay: Payer: Medicare Other | Attending: Oncology

## 2021-01-24 ENCOUNTER — Other Ambulatory Visit: Payer: Self-pay

## 2021-01-24 DIAGNOSIS — Z452 Encounter for adjustment and management of vascular access device: Secondary | ICD-10-CM | POA: Diagnosis not present

## 2021-01-24 DIAGNOSIS — Z95828 Presence of other vascular implants and grafts: Secondary | ICD-10-CM

## 2021-01-24 DIAGNOSIS — C670 Malignant neoplasm of trigone of bladder: Secondary | ICD-10-CM | POA: Insufficient documentation

## 2021-01-24 MED ORDER — SODIUM CHLORIDE 0.9% FLUSH
10.0000 mL | Freq: Once | INTRAVENOUS | Status: AC
  Administered 2021-01-24: 10 mL
  Filled 2021-01-24: qty 10

## 2021-01-24 MED ORDER — HEPARIN SOD (PORK) LOCK FLUSH 100 UNIT/ML IV SOLN
500.0000 [IU] | Freq: Once | INTRAVENOUS | Status: AC
  Administered 2021-01-24: 500 [IU]
  Filled 2021-01-24: qty 5

## 2021-01-24 NOTE — Patient Instructions (Signed)
Implanted Port Insertion, Care After This sheet gives you information about how to care for yourself after your procedure. Your health care provider may also give you more specific instructions. If you have problems or questions, contact your health care provider. What can I expect after the procedure? After the procedure, it is common to have:  Discomfort at the port insertion site.  Bruising on the skin over the port. This should improve over 3-4 days. Follow these instructions at home: Port care  After your port is placed, you will get a manufacturer's information card. The card has information about your port. Keep this card with you at all times.  Take care of the port as told by your health care provider. Ask your health care provider if you or a family member can get training for taking care of the port at home. A home health care nurse may also take care of the port.  Make sure to remember what type of port you have. Incision care  Follow instructions from your health care provider about how to take care of your port insertion site. Make sure you: ? Wash your hands with soap and water before and after you change your bandage (dressing). If soap and water are not available, use hand sanitizer. ? Change your dressing as told by your health care provider. ? Leave stitches (sutures), skin glue, or adhesive strips in place. These skin closures may need to stay in place for 2 weeks or longer. If adhesive strip edges start to loosen and curl up, you may trim the loose edges. Do not remove adhesive strips completely unless your health care provider tells you to do that.  Check your port insertion site every day for signs of infection. Check for: ? Redness, swelling, or pain. ? Fluid or blood. ? Warmth. ? Pus or a bad smell.      Activity  Return to your normal activities as told by your health care provider. Ask your health care provider what activities are safe for you.  Do not  lift anything that is heavier than 10 lb (4.5 kg), or the limit that you are told, until your health care provider says that it is safe. General instructions  Take over-the-counter and prescription medicines only as told by your health care provider.  Do not take baths, swim, or use a hot tub until your health care provider approves. Ask your health care provider if you may take showers. You may only be allowed to take sponge baths.  Do not drive for 24 hours if you were given a sedative during your procedure.  Wear a medical alert bracelet in case of an emergency. This will tell any health care providers that you have a port.  Keep all follow-up visits as told by your health care provider. This is important. Contact a health care provider if:  You cannot flush your port with saline as directed, or you cannot draw blood from the port.  You have a fever or chills.  You have redness, swelling, or pain around your port insertion site.  You have fluid or blood coming from your port insertion site.  Your port insertion site feels warm to the touch.  You have pus or a bad smell coming from the port insertion site. Get help right away if:  You have chest pain or shortness of breath.  You have bleeding from your port that you cannot control. Summary  Take care of the port as told by your   health care provider. Keep the manufacturer's information card with you at all times.  Change your dressing as told by your health care provider.  Contact a health care provider if you have a fever or chills or if you have redness, swelling, or pain around your port insertion site.  Keep all follow-up visits as told by your health care provider. This information is not intended to replace advice given to you by your health care provider. Make sure you discuss any questions you have with your health care provider. Document Revised: 06/28/2018 Document Reviewed: 06/28/2018 Elsevier Patient Education   2021 Elsevier Inc.  

## 2021-03-20 ENCOUNTER — Inpatient Hospital Stay: Payer: Medicare Other | Attending: Oncology

## 2021-03-20 ENCOUNTER — Other Ambulatory Visit: Payer: Self-pay

## 2021-03-20 DIAGNOSIS — C670 Malignant neoplasm of trigone of bladder: Secondary | ICD-10-CM | POA: Diagnosis present

## 2021-03-20 DIAGNOSIS — Z452 Encounter for adjustment and management of vascular access device: Secondary | ICD-10-CM | POA: Insufficient documentation

## 2021-05-22 ENCOUNTER — Inpatient Hospital Stay: Payer: Medicare Other | Attending: Oncology

## 2021-05-22 ENCOUNTER — Other Ambulatory Visit: Payer: Medicare Other

## 2021-05-22 ENCOUNTER — Other Ambulatory Visit: Payer: Self-pay

## 2021-05-22 DIAGNOSIS — C670 Malignant neoplasm of trigone of bladder: Secondary | ICD-10-CM | POA: Diagnosis present

## 2021-05-22 DIAGNOSIS — I700 Atherosclerosis of aorta: Secondary | ICD-10-CM | POA: Diagnosis not present

## 2021-05-22 DIAGNOSIS — R911 Solitary pulmonary nodule: Secondary | ICD-10-CM | POA: Insufficient documentation

## 2021-05-22 DIAGNOSIS — G629 Polyneuropathy, unspecified: Secondary | ICD-10-CM | POA: Insufficient documentation

## 2021-05-22 DIAGNOSIS — T451X5A Adverse effect of antineoplastic and immunosuppressive drugs, initial encounter: Secondary | ICD-10-CM | POA: Diagnosis not present

## 2021-05-22 DIAGNOSIS — Z923 Personal history of irradiation: Secondary | ICD-10-CM | POA: Insufficient documentation

## 2021-05-22 DIAGNOSIS — D72819 Decreased white blood cell count, unspecified: Secondary | ICD-10-CM | POA: Diagnosis not present

## 2021-05-22 DIAGNOSIS — D71 Functional disorders of polymorphonuclear neutrophils: Secondary | ICD-10-CM | POA: Insufficient documentation

## 2021-05-22 DIAGNOSIS — D6481 Anemia due to antineoplastic chemotherapy: Secondary | ICD-10-CM | POA: Insufficient documentation

## 2021-05-22 DIAGNOSIS — Z95828 Presence of other vascular implants and grafts: Secondary | ICD-10-CM

## 2021-05-22 DIAGNOSIS — Z79899 Other long term (current) drug therapy: Secondary | ICD-10-CM | POA: Diagnosis not present

## 2021-05-22 LAB — CBC WITH DIFFERENTIAL (CANCER CENTER ONLY)
Abs Immature Granulocytes: 0.01 10*3/uL (ref 0.00–0.07)
Basophils Absolute: 0 10*3/uL (ref 0.0–0.1)
Basophils Relative: 0 %
Eosinophils Absolute: 0.1 10*3/uL (ref 0.0–0.5)
Eosinophils Relative: 1 %
HCT: 38.6 % — ABNORMAL LOW (ref 39.0–52.0)
Hemoglobin: 13.2 g/dL (ref 13.0–17.0)
Immature Granulocytes: 0 %
Lymphocytes Relative: 40 %
Lymphs Abs: 1.4 10*3/uL (ref 0.7–4.0)
MCH: 30.4 pg (ref 26.0–34.0)
MCHC: 34.2 g/dL (ref 30.0–36.0)
MCV: 88.9 fL (ref 80.0–100.0)
Monocytes Absolute: 0.3 10*3/uL (ref 0.1–1.0)
Monocytes Relative: 7 %
Neutro Abs: 1.8 10*3/uL (ref 1.7–7.7)
Neutrophils Relative %: 52 %
Platelet Count: 184 10*3/uL (ref 150–400)
RBC: 4.34 MIL/uL (ref 4.22–5.81)
RDW: 13.2 % (ref 11.5–15.5)
WBC Count: 3.6 10*3/uL — ABNORMAL LOW (ref 4.0–10.5)
nRBC: 0 % (ref 0.0–0.2)

## 2021-05-22 LAB — CMP (CANCER CENTER ONLY)
ALT: 13 U/L (ref 0–44)
AST: 22 U/L (ref 15–41)
Albumin: 3.8 g/dL (ref 3.5–5.0)
Alkaline Phosphatase: 39 U/L (ref 38–126)
Anion gap: 10 (ref 5–15)
BUN: 28 mg/dL — ABNORMAL HIGH (ref 8–23)
CO2: 25 mmol/L (ref 22–32)
Calcium: 9.2 mg/dL (ref 8.9–10.3)
Chloride: 106 mmol/L (ref 98–111)
Creatinine: 0.98 mg/dL (ref 0.61–1.24)
GFR, Estimated: >60 mL/min (ref >60)
Glucose, Bld: 96 mg/dL (ref 70–99)
Potassium: 4.4 mmol/L (ref 3.5–5.1)
Sodium: 141 mmol/L (ref 135–145)
Total Bilirubin: 0.6 mg/dL (ref 0.3–1.2)
Total Protein: 7.1 g/dL (ref 6.5–8.1)

## 2021-05-22 MED ORDER — SODIUM CHLORIDE 0.9% FLUSH
10.0000 mL | Freq: Once | INTRAVENOUS | Status: AC
  Administered 2021-05-22: 11:00:00 10 mL
  Filled 2021-05-22: qty 10

## 2021-05-22 MED ORDER — HEPARIN SOD (PORK) LOCK FLUSH 100 UNIT/ML IV SOLN
500.0000 [IU] | Freq: Once | INTRAVENOUS | Status: AC
  Administered 2021-05-22: 11:00:00 500 [IU]
  Filled 2021-05-22: qty 5

## 2021-05-27 ENCOUNTER — Encounter (HOSPITAL_COMMUNITY)
Admission: RE | Admit: 2021-05-27 | Discharge: 2021-05-27 | Disposition: A | Discharge status: Home / Self Care | Payer: Medicare Other | Source: Ambulatory Visit | Attending: Oncology | Admitting: Oncology

## 2021-05-27 ENCOUNTER — Other Ambulatory Visit: Payer: Self-pay

## 2021-05-27 DIAGNOSIS — C670 Malignant neoplasm of trigone of bladder: Secondary | ICD-10-CM | POA: Diagnosis not present

## 2021-05-27 DIAGNOSIS — I700 Atherosclerosis of aorta: Secondary | ICD-10-CM | POA: Diagnosis not present

## 2021-05-27 LAB — GLUCOSE, CAPILLARY: Glucose-Capillary: 85 mg/dL (ref 70–99)

## 2021-05-27 MED ORDER — FLUDEOXYGLUCOSE F - 18 (FDG) INJECTION
6.6000 | Freq: Once | INTRAVENOUS | Status: AC
  Administered 2021-05-27: 15:00:00 7.03 via INTRAVENOUS

## 2021-05-29 ENCOUNTER — Inpatient Hospital Stay (HOSPITAL_BASED_OUTPATIENT_CLINIC_OR_DEPARTMENT_OTHER): Payer: Medicare Other | Admitting: Oncology

## 2021-05-29 ENCOUNTER — Other Ambulatory Visit: Payer: Self-pay

## 2021-05-29 VITALS — BP 139/75 | HR 55 | Temp 97.2°F | Resp 17 | Ht 64.0 in | Wt 127.2 lb

## 2021-05-29 DIAGNOSIS — C670 Malignant neoplasm of trigone of bladder: Secondary | ICD-10-CM | POA: Diagnosis not present

## 2021-05-29 NOTE — Progress Notes (Signed)
Hematology and Oncology Follow Up Visit  Robert Lowery [id number] 16-Jan-1956 65 y.o. 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  He initially presented with localized disease in 2019 and developed pulmonary involvement in 2020.   Prior Therapy:   He is status post cystectomy done in July 2019.  Surgery performed in [place]. He is status post lung biopsy completed on June 2 of 2020. Gemcitabine and cisplatin chemotherapy cycle 1 started on 06/02/2019.  He completed 6 cycles of therapy on September 21, 2019.  He had a near complete response with a residual pulmonary nodule. He is status post wedge resection of right upper lobe residual tumor using a right thoracotomy completed on October 30, 2019.  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 He is status post radiation therapy to the resected right upper lung.  He received 50 Gray in 5 fractions in March 2021.    Current therapy: Active surveillance.    Interim History: [name] presents today for repeat evaluation.  Since the last visit, he reports feeling well without any complaints.  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  He denies any recent hospitalization or illnesses.  He denies any respiratory complaints.  Denies any cough wheezing or hemoptysis.  He is enjoying excellent quality of life continues to play golf regularly.               Medications: Updated on review. Current Outpatient Medications  Medication Sig Dispense Refill   Ascorbic Acid (VITAMIN C) 1000 MG tablet Take 1,000 mg by mouth daily.     aspirin 81 MG chewable tablet Chew 1 tablet (81 mg total) by mouth daily.     cholecalciferol (VITAMIN D3) 25 MCG (1000 UT) tablet Take 1,000 Units by mouth daily.     CINNAMON PO  Take 1,000 mg by mouth daily.     lansoprazole (PREVACID) 15 MG capsule Take 15 mg by mouth daily before breakfast.     lidocaine-prilocaine (EMLA) cream APPLY ONE APPLICATION TOPICALLY AS NEEDED 30 g 0   Multiple Vitamin (MULTIVITAMIN WITH MINERALS) TABS tablet Take 1 tablet by mouth daily.     prochlorperazine (COMPAZINE) 10 MG tablet TAKE ONE TABLET BY MOUTH EVERY SIX HOURS AS NEEDED FOR NAUSEA OR VOMITING 30 tablet 0   promethazine (PHENERGAN) 25 MG tablet Take 1 tablet (25 mg total) by mouth every 6 (six) hours as needed for nausea or vomiting. 50 tablet 0   No current facility-administered medications for this visit.     Allergies: No Known Allergies   Physical exam   Blood pressure 139/75, pulse (!) 55, temperature (!) 97.2 F (36.2 C), temperature source Tympanic, resp. rate 17, height 5\' 4"  (1.626 m), weight 127 lb 3.2 oz (57.7 kg), SpO2 99 %.   ECOG 0    General appearance: Comfortable appearing without any discomfort Head: Normocephalic without any trauma Oropharynx: Mucous membranes are moist and pink without any thrush or ulcers. Eyes: Pupils are equal and round reactive to light. Lymph nodes: No cervical, supraclavicular, inguinal or axillary lymphadenopathy.   Heart:regular rate and rhythm.  S1 and S2 without leg edema. Lung: Clear without any rhonchi or wheezes.  No dullness to percussion. Abdomin: Soft, nontender, nondistended with good bowel sounds.  No hepatosplenomegaly. Musculoskeletal: No joint deformity or effusion.  Full range of motion noted. Neurological: No deficits noted on motor, sensory and deep tendon reflex exam. Skin: No petechial rash or dryness.  Appeared moist.  Psychiatric: Mood and affect appeared appropriate.     .      Lab Results: Lab Results  Component Value Date   WBC 3.6 (L) 05/22/2021   HGB 13.2 05/22/2021   HCT 38.6 (L) 05/22/2021   MCV 88.9 05/22/2021   PLT 184 05/22/2021     Chemistry      Component Value  Date/Time   NA 141 05/22/2021 1024   K 4.4 05/22/2021 1024   CL 106 05/22/2021 1024   CO2 25 05/22/2021 1024   BUN 28 (H) 05/22/2021 1024   CREATININE 0.98 05/22/2021 1024      Component Value Date/Time   CALCIUM 9.2 05/22/2021 1024   ALKPHOS 39 05/22/2021 1024   AST 22 05/22/2021 1024   ALT 13 05/22/2021 1024   BILITOT 0.6 05/22/2021 1024        IMPRESSION: 1. No evidence of metastatic disease. 2. Persistent small hypermetabolic AP window and bihilar lymph nodes, decreased in hypermetabolism from 11/18/2020 and likely reactive/inflammatory in etiology. 3. Aortic atherosclerosis (ICD10-I70.0).   Impression and Plan:   65 year old man with   1.  Stage IV bladder cancer with a pulmonary involvement documented in 2020.  He was initially diagnosed in 2019 with localized disease.  He continues to show no evidence of active disease after surgical resection and adjuvant chemotherapy.  PET scan obtained on 05/27/2021 was personally reviewed and showed no evidence of metastasis.  Risks and benefits of continued active surveillance.  The role of immunotherapy option was discussed and the stage IV resected disease.  At this time, we opted to continue with active surveillance and reinstitute immunotherapy if he developed relapsed disease.  He is agreeable with this plan.  2.  Leukocytopenia: Remains mild with a normal differential.  Absolute neutrophil count is adequate.   3.  Anemia: Related to chemotherapy.  Resolved at this time.  4.  IV access: Port-A-Cath remains in place.  Risks and benefits of removal versus continuous flush were discussed.  We opted to keep his Port-A-Cath for another 6 months and potentially remove it if his next scan is negative.  5.  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   6.  Follow-up: He will return in 6 months for repeat evaluation and scans.     30  minutes were spent on this encounter.  The time was dedicated to  reviewing laboratory data, disease status update, reviewing imaging studies and future plan of care discussion.         Zola Button, MD 6/16/20229:53 AM

## 2021-07-05 ENCOUNTER — Encounter: Payer: Self-pay | Admitting: Oncology

## 2021-07-31 ENCOUNTER — Inpatient Hospital Stay: Payer: Medicare Other | Attending: Oncology

## 2021-07-31 ENCOUNTER — Other Ambulatory Visit: Payer: Self-pay

## 2021-07-31 DIAGNOSIS — Z452 Encounter for adjustment and management of vascular access device: Secondary | ICD-10-CM | POA: Insufficient documentation

## 2021-07-31 DIAGNOSIS — C670 Malignant neoplasm of trigone of bladder: Secondary | ICD-10-CM | POA: Diagnosis present

## 2021-07-31 DIAGNOSIS — Z95828 Presence of other vascular implants and grafts: Secondary | ICD-10-CM

## 2021-07-31 MED ORDER — SODIUM CHLORIDE 0.9% FLUSH
10.0000 mL | Freq: Once | INTRAVENOUS | Status: AC
  Administered 2021-07-31: 10 mL

## 2021-07-31 MED ORDER — HEPARIN SOD (PORK) LOCK FLUSH 100 UNIT/ML IV SOLN
500.0000 [IU] | Freq: Once | INTRAVENOUS | Status: AC
  Administered 2021-07-31: 500 [IU]

## 2021-08-08 ENCOUNTER — Telehealth: Payer: Self-pay | Admitting: Oncology

## 2021-08-08 NOTE — Telephone Encounter (Signed)
Called patient regarding upcoming October and December appointments, patient is notified.

## 2021-09-25 ENCOUNTER — Other Ambulatory Visit: Payer: Self-pay

## 2021-09-25 ENCOUNTER — Inpatient Hospital Stay: Payer: Medicare Other | Attending: Oncology

## 2021-09-25 DIAGNOSIS — Z452 Encounter for adjustment and management of vascular access device: Secondary | ICD-10-CM | POA: Insufficient documentation

## 2021-09-25 DIAGNOSIS — C670 Malignant neoplasm of trigone of bladder: Secondary | ICD-10-CM | POA: Insufficient documentation

## 2021-09-25 DIAGNOSIS — Z95828 Presence of other vascular implants and grafts: Secondary | ICD-10-CM

## 2021-09-25 MED ORDER — SODIUM CHLORIDE 0.9% FLUSH
10.0000 mL | Freq: Once | INTRAVENOUS | Status: AC
  Administered 2021-09-25: 10 mL

## 2021-09-25 MED ORDER — HEPARIN SOD (PORK) LOCK FLUSH 100 UNIT/ML IV SOLN
500.0000 [IU] | Freq: Once | INTRAVENOUS | Status: AC
  Administered 2021-09-25: 500 [IU]

## 2021-11-20 ENCOUNTER — Other Ambulatory Visit: Payer: Self-pay

## 2021-11-20 ENCOUNTER — Ambulatory Visit (HOSPITAL_COMMUNITY)
Admission: RE | Admit: 2021-11-20 | Discharge: 2021-11-20 | Disposition: A | Discharge status: Home / Self Care | Payer: Medicare Other | Source: Ambulatory Visit | Attending: Oncology | Admitting: Oncology

## 2021-11-20 ENCOUNTER — Inpatient Hospital Stay: Payer: Medicare Other | Attending: Oncology

## 2021-11-20 DIAGNOSIS — C670 Malignant neoplasm of trigone of bladder: Secondary | ICD-10-CM | POA: Diagnosis present

## 2021-11-20 DIAGNOSIS — C679 Malignant neoplasm of bladder, unspecified: Secondary | ICD-10-CM | POA: Insufficient documentation

## 2021-11-20 DIAGNOSIS — D649 Anemia, unspecified: Secondary | ICD-10-CM | POA: Insufficient documentation

## 2021-11-20 DIAGNOSIS — Z95828 Presence of other vascular implants and grafts: Secondary | ICD-10-CM

## 2021-11-20 DIAGNOSIS — G629 Polyneuropathy, unspecified: Secondary | ICD-10-CM | POA: Insufficient documentation

## 2021-11-20 DIAGNOSIS — I700 Atherosclerosis of aorta: Secondary | ICD-10-CM | POA: Insufficient documentation

## 2021-11-20 DIAGNOSIS — D72819 Decreased white blood cell count, unspecified: Secondary | ICD-10-CM | POA: Insufficient documentation

## 2021-11-20 DIAGNOSIS — C7800 Secondary malignant neoplasm of unspecified lung: Secondary | ICD-10-CM | POA: Insufficient documentation

## 2021-11-20 DIAGNOSIS — D71 Functional disorders of polymorphonuclear neutrophils: Secondary | ICD-10-CM | POA: Diagnosis not present

## 2021-11-20 LAB — CMP (CANCER CENTER ONLY)
ALT: 16 U/L (ref 0–44)
AST: 18 U/L (ref 15–41)
Albumin: 3.4 g/dL — ABNORMAL LOW (ref 3.5–5.0)
Alkaline Phosphatase: 41 U/L (ref 38–126)
Anion gap: 10 (ref 5–15)
BUN: 22 mg/dL (ref 8–23)
CO2: 25 mmol/L (ref 22–32)
Calcium: 8.6 mg/dL — ABNORMAL LOW (ref 8.9–10.3)
Chloride: 105 mmol/L (ref 98–111)
Creatinine: 0.83 mg/dL (ref 0.61–1.24)
GFR, Estimated: >60 mL/min (ref >60)
Glucose, Bld: 97 mg/dL (ref 70–99)
Potassium: 4 mmol/L (ref 3.5–5.1)
Sodium: 140 mmol/L (ref 135–145)
Total Bilirubin: 0.3 mg/dL (ref 0.3–1.2)
Total Protein: 6.6 g/dL (ref 6.5–8.1)

## 2021-11-20 LAB — CBC WITH DIFFERENTIAL (CANCER CENTER ONLY)
Abs Immature Granulocytes: 0.01 10*3/uL (ref 0.00–0.07)
Basophils Absolute: 0 10*3/uL (ref 0.0–0.1)
Basophils Relative: 0 %
Eosinophils Absolute: 0.1 10*3/uL (ref 0.0–0.5)
Eosinophils Relative: 4 %
HCT: 37.9 % — ABNORMAL LOW (ref 39.0–52.0)
Hemoglobin: 12.7 g/dL — ABNORMAL LOW (ref 13.0–17.0)
Immature Granulocytes: 0 %
Lymphocytes Relative: 45 %
Lymphs Abs: 1.5 10*3/uL (ref 0.7–4.0)
MCH: 30 pg (ref 26.0–34.0)
MCHC: 33.5 g/dL (ref 30.0–36.0)
MCV: 89.4 fL (ref 80.0–100.0)
Monocytes Absolute: 0.2 10*3/uL (ref 0.1–1.0)
Monocytes Relative: 4 %
Neutro Abs: 1.6 10*3/uL — ABNORMAL LOW (ref 1.7–7.7)
Neutrophils Relative %: 47 %
Platelet Count: 188 10*3/uL (ref 150–400)
RBC: 4.24 MIL/uL (ref 4.22–5.81)
RDW: 13.1 % (ref 11.5–15.5)
WBC Count: 3.4 10*3/uL — ABNORMAL LOW (ref 4.0–10.5)
nRBC: 0 % (ref 0.0–0.2)

## 2021-11-20 LAB — GLUCOSE, CAPILLARY: Glucose-Capillary: 107 mg/dL — ABNORMAL HIGH (ref 70–99)

## 2021-11-20 MED ORDER — SODIUM CHLORIDE 0.9% FLUSH
10.0000 mL | Freq: Once | INTRAVENOUS | Status: AC
  Administered 2021-11-20: 10 mL

## 2021-11-20 MED ORDER — FLUDEOXYGLUCOSE F - 18 (FDG) INJECTION
6.3000 | Freq: Once | INTRAVENOUS | Status: AC
  Administered 2021-11-20: 6.24 via INTRAVENOUS

## 2021-11-20 MED ORDER — HEPARIN SOD (PORK) LOCK FLUSH 100 UNIT/ML IV SOLN
500.0000 [IU] | Freq: Once | INTRAVENOUS | Status: AC
  Administered 2021-11-20: 500 [IU]

## 2021-11-27 ENCOUNTER — Inpatient Hospital Stay (HOSPITAL_BASED_OUTPATIENT_CLINIC_OR_DEPARTMENT_OTHER): Payer: Medicare Other | Admitting: Oncology

## 2021-11-27 ENCOUNTER — Other Ambulatory Visit: Payer: Self-pay

## 2021-11-27 VITALS — BP 134/58 | HR 46 | Temp 98.1°F | Resp 15 | Ht 64.0 in | Wt 129.5 lb

## 2021-11-27 DIAGNOSIS — C670 Malignant neoplasm of trigone of bladder: Secondary | ICD-10-CM | POA: Diagnosis not present

## 2021-11-27 DIAGNOSIS — C679 Malignant neoplasm of bladder, unspecified: Secondary | ICD-10-CM | POA: Diagnosis not present

## 2021-11-27 NOTE — Progress Notes (Signed)
Hematology and Oncology Follow Up Visit  Robert Lowery [id number] November 14, 1956 65 y.o. 11/27/2021 8:20 AM [name], MDMoreira, [name], MD   Principle Diagnosis: 65 year old man with bladder cancer diagnosed in 2019.  He developed stage IV disease with isolated pulmonary metastasis in 2020. Prior Therapy:   He is status post cystectomy done in July 2019.  Surgery performed in [place]. He is status post lung biopsy completed on June 2 of 2020. Gemcitabine and cisplatin chemotherapy cycle 1 started on 06/02/2019.  He completed 6 cycles of therapy on September 21, 2019.  He had a near complete response with a residual pulmonary nodule. He is status post wedge resection of right upper lobe residual tumor using a right thoracotomy completed on October 30, 2019.  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 He is status post radiation therapy to the resected right upper lung.  He received 50 Gray in 5 fractions in March 2021.    Current therapy: Active surveillance.    Interim History: Robert Lowery returns today for a follow-up visit.  Since her last visit, he reports feeling well without any major complaints.  He denies any nausea, vomiting or abdominal pain.  He does have a worsening sensory neuropathy in his lower extremities.  This is not interfering with function and able to exercise regularly and plays golf.               Medications: Reviewed without changes. Current Outpatient Medications  Medication Sig Dispense Refill   Ascorbic Acid (VITAMIN C) 1000 MG tablet Take 1,000 mg by mouth daily.     aspirin 81 MG chewable tablet Chew 1 tablet (81 mg total) by mouth daily.     cholecalciferol (VITAMIN D3) 25 MCG (1000 UT) tablet Take 1,000 Units by mouth daily.     CINNAMON PO Take 1,000 mg by mouth daily.     lansoprazole (PREVACID) 15 MG capsule Take 15 mg by mouth daily before breakfast.     lidocaine-prilocaine (EMLA) cream APPLY ONE APPLICATION TOPICALLY  AS NEEDED 30 g 0   Multiple Vitamin (MULTIVITAMIN WITH MINERALS) TABS tablet Take 1 tablet by mouth daily.     prochlorperazine (COMPAZINE) 10 MG tablet TAKE ONE TABLET BY MOUTH EVERY SIX HOURS AS NEEDED FOR NAUSEA OR VOMITING 30 tablet 0   promethazine (PHENERGAN) 25 MG tablet Take 1 tablet (25 mg total) by mouth every 6 (six) hours as needed for nausea or vomiting. 50 tablet 0   No current facility-administered medications for this visit.     Allergies: No Known Allergies   Physical exam  Blood pressure (!) 134/58, pulse (!) 46, temperature 98.1 F (36.7 C), temperature source Temporal, resp. rate 15, height 5\' 4"  (1.626 m), weight 129 lb 8 oz (58.7 kg), SpO2 100 %.    ECOG 0   General appearance: Alert, awake without any distress. Head: Atraumatic without abnormalities Oropharynx: Without any thrush or ulcers. Eyes: No scleral icterus. Lymph nodes: No lymphadenopathy noted in the cervical, supraclavicular, or axillary nodes Heart:regular rate and rhythm, without any murmurs or gallops.   Lung: Clear to auscultation without any rhonchi, wheezes or dullness to percussion. Abdomin: Soft, nontender without any shifting dullness or ascites. Musculoskeletal: No clubbing or cyanosis. Neurological: No motor or sensory deficits. Skin: No rashes or lesions.       .      Lab Results: Lab Results  Component Value Date   WBC 3.4 (L) 11/20/2021   HGB 12.7 (L) 11/20/2021  HCT 37.9 (L) 11/20/2021   MCV 89.4 11/20/2021   PLT 188 11/20/2021     Chemistry      Component Value Date/Time   NA 140 11/20/2021 1132   K 4.0 11/20/2021 1132   CL 105 11/20/2021 1132   CO2 25 11/20/2021 1132   BUN 22 11/20/2021 1132   CREATININE 0.83 11/20/2021 1132      Component Value Date/Time   CALCIUM 8.6 (L) 11/20/2021 1132   ALKPHOS 41 11/20/2021 1132   AST 18 11/20/2021 1132   ALT 16 11/20/2021 1132   BILITOT 0.3 11/20/2021 1132        IMPRESSION: 1. No evidence of  metastatic disease. 2. Persistent small hypermetabolic AP window and bihilar lymph nodes, decreased in hypermetabolism from 11/18/2020 and likely reactive/inflammatory in etiology. 3. Aortic atherosclerosis (ICD10-I70.0).   Impression and Plan:   65 year old man with   1.  Bladder cancer diagnosed in 2019.  He developed stage IV disease with isolated pulmonary metastasis in 2020.  His disease status was updated at this time and treatment choices were reviewed.  He continues to show no evidence of disease 2 years after his definitive therapy.  PET scan obtained on 11/20/2021 was personally reviewed.  Pelvis treatment options including immunotherapy and antibiotic drug conjugate were reiterated but these will be deferred unless he has metastatic disease in the future.  He is agreeable to continue at this time.  2.  Leukocytopenia: Remains mild and consistent with his baseline.  No intervention is needed at this time.   3.  Anemia: His hemoglobin is currently at baseline without any issue.  4.  IV access: Risks and benefits of a Port-A-Cath removal were discussed at this time.  5.  Granulomatous disease: Unchanged based on pet imaging compared to previous imaging.  We will continue to monitor in future studies.  6.  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  No intervention is needed.   7.  Follow-up: In 6 months for repeat follow-up scans     30  minutes were dedicated to this visit.  The time was spent on reviewing laboratory data, imaging studies, discussing treatment choices and future plan of care review.         Zola Button, MD 12/15/20228:20 AM

## 2022-01-13 ENCOUNTER — Telehealth: Payer: Self-pay | Admitting: *Deleted

## 2022-01-13 NOTE — Telephone Encounter (Signed)
Pt called and stated he was having right side itchiness, numbness at his nipple and sharp pain in upper right chest area. Advised pt to call PCP to rule out shingles. He verbalized understanding through [place] interpreter.

## 2022-01-14 ENCOUNTER — Ambulatory Visit
Admission: RE | Admit: 2022-01-14 | Discharge: 2022-01-14 | Disposition: A | Discharge status: Home / Self Care | Payer: Medicare Other | Source: Ambulatory Visit | Attending: Internal Medicine | Admitting: Internal Medicine

## 2022-01-14 ENCOUNTER — Other Ambulatory Visit: Payer: Self-pay

## 2022-01-14 ENCOUNTER — Other Ambulatory Visit: Payer: Self-pay | Admitting: Internal Medicine

## 2022-01-14 DIAGNOSIS — R0789 Other chest pain: Secondary | ICD-10-CM

## 2022-01-29 ENCOUNTER — Inpatient Hospital Stay: Payer: Medicare Other | Attending: Oncology

## 2022-01-29 ENCOUNTER — Other Ambulatory Visit: Payer: Self-pay

## 2022-01-29 DIAGNOSIS — Z8551 Personal history of malignant neoplasm of bladder: Secondary | ICD-10-CM | POA: Diagnosis not present

## 2022-01-29 DIAGNOSIS — Z85118 Personal history of other malignant neoplasm of bronchus and lung: Secondary | ICD-10-CM | POA: Insufficient documentation

## 2022-01-29 DIAGNOSIS — G629 Polyneuropathy, unspecified: Secondary | ICD-10-CM | POA: Diagnosis not present

## 2022-01-29 DIAGNOSIS — D72819 Decreased white blood cell count, unspecified: Secondary | ICD-10-CM | POA: Diagnosis not present

## 2022-01-29 DIAGNOSIS — Z95828 Presence of other vascular implants and grafts: Secondary | ICD-10-CM

## 2022-01-29 DIAGNOSIS — D649 Anemia, unspecified: Secondary | ICD-10-CM | POA: Insufficient documentation

## 2022-01-29 DIAGNOSIS — C670 Malignant neoplasm of trigone of bladder: Secondary | ICD-10-CM

## 2022-01-29 MED ORDER — HEPARIN SOD (PORK) LOCK FLUSH 100 UNIT/ML IV SOLN
500.0000 [IU] | Freq: Once | INTRAVENOUS | Status: AC
  Administered 2022-01-29: 500 [IU]

## 2022-01-29 MED ORDER — SODIUM CHLORIDE 0.9% FLUSH
10.0000 mL | Freq: Once | INTRAVENOUS | Status: AC
  Administered 2022-01-29: 10 mL

## 2022-03-26 ENCOUNTER — Inpatient Hospital Stay: Payer: Medicare Other | Attending: Oncology

## 2022-03-26 ENCOUNTER — Other Ambulatory Visit: Payer: Self-pay

## 2022-03-26 ENCOUNTER — Other Ambulatory Visit: Payer: Self-pay | Admitting: Oncology

## 2022-03-26 ENCOUNTER — Telehealth: Payer: Self-pay | Admitting: *Deleted

## 2022-03-26 DIAGNOSIS — Z95828 Presence of other vascular implants and grafts: Secondary | ICD-10-CM

## 2022-03-26 DIAGNOSIS — D72819 Decreased white blood cell count, unspecified: Secondary | ICD-10-CM | POA: Diagnosis present

## 2022-03-26 DIAGNOSIS — C670 Malignant neoplasm of trigone of bladder: Secondary | ICD-10-CM

## 2022-03-26 DIAGNOSIS — Z452 Encounter for adjustment and management of vascular access device: Secondary | ICD-10-CM | POA: Diagnosis present

## 2022-03-26 DIAGNOSIS — Z8551 Personal history of malignant neoplasm of bladder: Secondary | ICD-10-CM | POA: Diagnosis present

## 2022-03-26 MED ORDER — SODIUM CHLORIDE 0.9% FLUSH
10.0000 mL | Freq: Once | INTRAVENOUS | Status: AC
  Administered 2022-03-26: 10 mL

## 2022-03-26 MED ORDER — HEPARIN SOD (PORK) LOCK FLUSH 100 UNIT/ML IV SOLN
500.0000 [IU] | Freq: Once | INTRAVENOUS | Status: AC
  Administered 2022-03-26: 500 [IU]

## 2022-03-26 NOTE — Progress Notes (Signed)
Pt has complaints of sharp pains in the right side of his abdomen and numbness in right nipple area. Dr.Shadad made aware and has stated to let pt know that we will move his scan up to be done soon to evaluate and MD follow up will depend on the result of the scan. Pt verbalized understanding and has no other concerns at this time.  ?

## 2022-03-26 NOTE — Telephone Encounter (Signed)
PC to patient, informed him his PET scan which has been ordered is authorized by his insurance & he may schedule it at any time.  Central Scheduling phone number given, [phone], patient instructed to call to schedule his scan.  Patient verbalizes understanding. ?

## 2022-04-09 ENCOUNTER — Ambulatory Visit (HOSPITAL_COMMUNITY)
Admission: RE | Admit: 2022-04-09 | Discharge: 2022-04-09 | Disposition: A | Discharge status: Home / Self Care | Payer: Medicare Other | Source: Ambulatory Visit | Attending: Oncology | Admitting: Oncology

## 2022-04-09 ENCOUNTER — Telehealth: Payer: Self-pay | Admitting: *Deleted

## 2022-04-09 DIAGNOSIS — C670 Malignant neoplasm of trigone of bladder: Secondary | ICD-10-CM | POA: Insufficient documentation

## 2022-04-09 LAB — GLUCOSE, CAPILLARY
Glucose-Capillary: 51 mg/dL — ABNORMAL LOW (ref 70–99)
Glucose-Capillary: 92 mg/dL (ref 70–99)

## 2022-04-09 MED ORDER — FLUDEOXYGLUCOSE F - 18 (FDG) INJECTION
6.5800 | Freq: Once | INTRAVENOUS | Status: AC
  Administered 2022-04-09: 6.58 via INTRAVENOUS

## 2022-04-09 NOTE — Telephone Encounter (Signed)
-----   Message from Wyatt Portela, MD sent at 04/09/2022 12:11 PM EDT ----- ?Please let him know his scan is unchanged. No cancer noted.  ?

## 2022-04-09 NOTE — Telephone Encounter (Signed)
Called via [place] interpreter, left message with note below.  ?

## 2022-05-28 ENCOUNTER — Other Ambulatory Visit: Payer: Self-pay

## 2022-05-28 ENCOUNTER — Inpatient Hospital Stay: Payer: Medicare Other | Attending: Oncology

## 2022-05-28 DIAGNOSIS — Z8551 Personal history of malignant neoplasm of bladder: Secondary | ICD-10-CM | POA: Diagnosis not present

## 2022-05-28 DIAGNOSIS — Z95828 Presence of other vascular implants and grafts: Secondary | ICD-10-CM

## 2022-05-28 DIAGNOSIS — K5900 Constipation, unspecified: Secondary | ICD-10-CM | POA: Diagnosis not present

## 2022-05-28 DIAGNOSIS — D649 Anemia, unspecified: Secondary | ICD-10-CM | POA: Diagnosis present

## 2022-05-28 DIAGNOSIS — C670 Malignant neoplasm of trigone of bladder: Secondary | ICD-10-CM

## 2022-05-28 DIAGNOSIS — D71 Functional disorders of polymorphonuclear neutrophils: Secondary | ICD-10-CM | POA: Diagnosis not present

## 2022-05-28 LAB — CBC WITH DIFFERENTIAL (CANCER CENTER ONLY)
Abs Immature Granulocytes: 0.01 10*3/uL (ref 0.00–0.07)
Basophils Absolute: 0 10*3/uL (ref 0.0–0.1)
Basophils Relative: 0 %
Eosinophils Absolute: 0.1 10*3/uL (ref 0.0–0.5)
Eosinophils Relative: 1 %
HCT: 37.3 % — ABNORMAL LOW (ref 39.0–52.0)
Hemoglobin: 12.6 g/dL — ABNORMAL LOW (ref 13.0–17.0)
Immature Granulocytes: 0 %
Lymphocytes Relative: 46 %
Lymphs Abs: 1.9 10*3/uL (ref 0.7–4.0)
MCH: 30.7 pg (ref 26.0–34.0)
MCHC: 33.8 g/dL (ref 30.0–36.0)
MCV: 91 fL (ref 80.0–100.0)
Monocytes Absolute: 0.3 10*3/uL (ref 0.1–1.0)
Monocytes Relative: 7 %
Neutro Abs: 2 10*3/uL (ref 1.7–7.7)
Neutrophils Relative %: 46 %
Platelet Count: 206 10*3/uL (ref 150–400)
RBC: 4.1 MIL/uL — ABNORMAL LOW (ref 4.22–5.81)
RDW: 13.2 % (ref 11.5–15.5)
WBC Count: 4.2 10*3/uL (ref 4.0–10.5)
nRBC: 0 % (ref 0.0–0.2)

## 2022-05-28 LAB — CMP (CANCER CENTER ONLY)
ALT: 16 U/L (ref 0–44)
AST: 19 U/L (ref 15–41)
Albumin: 4 g/dL (ref 3.5–5.0)
Alkaline Phosphatase: 41 U/L (ref 38–126)
Anion gap: 3 — ABNORMAL LOW (ref 5–15)
BUN: 22 mg/dL (ref 8–23)
CO2: 30 mmol/L (ref 22–32)
Calcium: 9.1 mg/dL (ref 8.9–10.3)
Chloride: 108 mmol/L (ref 98–111)
Creatinine: 0.89 mg/dL (ref 0.61–1.24)
GFR, Estimated: >60 mL/min (ref >60)
Glucose, Bld: 96 mg/dL (ref 70–99)
Potassium: 4 mmol/L (ref 3.5–5.1)
Sodium: 141 mmol/L (ref 135–145)
Total Bilirubin: 0.6 mg/dL (ref 0.3–1.2)
Total Protein: 6.4 g/dL — ABNORMAL LOW (ref 6.5–8.1)

## 2022-05-28 MED ORDER — HEPARIN SOD (PORK) LOCK FLUSH 100 UNIT/ML IV SOLN
500.0000 [IU] | Freq: Once | INTRAVENOUS | Status: AC
  Administered 2022-05-28: 500 [IU]

## 2022-05-28 MED ORDER — SODIUM CHLORIDE 0.9% FLUSH
10.0000 mL | Freq: Once | INTRAVENOUS | Status: AC
  Administered 2022-05-28: 10 mL

## 2022-06-04 ENCOUNTER — Other Ambulatory Visit: Payer: Self-pay

## 2022-06-04 ENCOUNTER — Inpatient Hospital Stay (HOSPITAL_BASED_OUTPATIENT_CLINIC_OR_DEPARTMENT_OTHER): Payer: Medicare Other | Admitting: Oncology

## 2022-06-04 VITALS — BP 131/74 | HR 68 | Temp 97.2°F | Resp 17 | Ht 64.0 in | Wt 129.2 lb

## 2022-06-04 DIAGNOSIS — D649 Anemia, unspecified: Secondary | ICD-10-CM | POA: Diagnosis not present

## 2022-06-04 DIAGNOSIS — C670 Malignant neoplasm of trigone of bladder: Secondary | ICD-10-CM

## 2022-06-19 ENCOUNTER — Telehealth: Payer: Self-pay | Admitting: Oncology

## 2022-06-19 NOTE — Telephone Encounter (Signed)
Called patient regarding upcoming August, October and December appointments. [facility] has been called and voicemail was left.

## 2022-08-04 ENCOUNTER — Inpatient Hospital Stay: Payer: Medicare Other | Attending: Oncology

## 2022-08-04 ENCOUNTER — Other Ambulatory Visit: Payer: Self-pay

## 2022-08-04 DIAGNOSIS — Z452 Encounter for adjustment and management of vascular access device: Secondary | ICD-10-CM | POA: Diagnosis present

## 2022-08-04 DIAGNOSIS — C670 Malignant neoplasm of trigone of bladder: Secondary | ICD-10-CM

## 2022-08-04 DIAGNOSIS — Z8551 Personal history of malignant neoplasm of bladder: Secondary | ICD-10-CM | POA: Diagnosis present

## 2022-08-04 DIAGNOSIS — D649 Anemia, unspecified: Secondary | ICD-10-CM | POA: Insufficient documentation

## 2022-08-04 DIAGNOSIS — Z95828 Presence of other vascular implants and grafts: Secondary | ICD-10-CM

## 2022-08-04 MED ORDER — SODIUM CHLORIDE 0.9% FLUSH
10.0000 mL | Freq: Once | INTRAVENOUS | Status: AC
  Administered 2022-08-04: 10 mL

## 2022-08-04 MED ORDER — HEPARIN SOD (PORK) LOCK FLUSH 100 UNIT/ML IV SOLN
500.0000 [IU] | Freq: Once | INTRAVENOUS | Status: AC
  Administered 2022-08-04: 500 [IU]

## 2022-10-01 ENCOUNTER — Inpatient Hospital Stay: Payer: Medicare Other | Attending: Oncology

## 2022-10-01 ENCOUNTER — Other Ambulatory Visit: Payer: Self-pay

## 2022-10-01 DIAGNOSIS — Z8551 Personal history of malignant neoplasm of bladder: Secondary | ICD-10-CM | POA: Diagnosis present

## 2022-10-01 DIAGNOSIS — D649 Anemia, unspecified: Secondary | ICD-10-CM | POA: Insufficient documentation

## 2022-10-01 DIAGNOSIS — C670 Malignant neoplasm of trigone of bladder: Secondary | ICD-10-CM

## 2022-10-01 DIAGNOSIS — Z95828 Presence of other vascular implants and grafts: Secondary | ICD-10-CM

## 2022-10-01 LAB — CBC WITH DIFFERENTIAL (CANCER CENTER ONLY)
Abs Immature Granulocytes: 0.01 10*3/uL (ref 0.00–0.07)
Basophils Absolute: 0 10*3/uL (ref 0.0–0.1)
Basophils Relative: 0 %
Eosinophils Absolute: 0.1 10*3/uL (ref 0.0–0.5)
Eosinophils Relative: 2 %
HCT: 38 % — ABNORMAL LOW (ref 39.0–52.0)
Hemoglobin: 12.8 g/dL — ABNORMAL LOW (ref 13.0–17.0)
Immature Granulocytes: 0 %
Lymphocytes Relative: 42 %
Lymphs Abs: 1.7 10*3/uL (ref 0.7–4.0)
MCH: 30 pg (ref 26.0–34.0)
MCHC: 33.7 g/dL (ref 30.0–36.0)
MCV: 89.2 fL (ref 80.0–100.0)
Monocytes Absolute: 0.3 10*3/uL (ref 0.1–1.0)
Monocytes Relative: 7 %
Neutro Abs: 2 10*3/uL (ref 1.7–7.7)
Neutrophils Relative %: 49 %
Platelet Count: 223 10*3/uL (ref 150–400)
RBC: 4.26 MIL/uL (ref 4.22–5.81)
RDW: 13 % (ref 11.5–15.5)
WBC Count: 4 10*3/uL (ref 4.0–10.5)
nRBC: 0 % (ref 0.0–0.2)

## 2022-10-01 LAB — CMP (CANCER CENTER ONLY)
ALT: 23 U/L (ref 0–44)
AST: 23 U/L (ref 15–41)
Albumin: 4.1 g/dL (ref 3.5–5.0)
Alkaline Phosphatase: 40 U/L (ref 38–126)
Anion gap: 5 (ref 5–15)
BUN: 27 mg/dL — ABNORMAL HIGH (ref 8–23)
CO2: 29 mmol/L (ref 22–32)
Calcium: 8.9 mg/dL (ref 8.9–10.3)
Chloride: 105 mmol/L (ref 98–111)
Creatinine: 0.85 mg/dL (ref 0.61–1.24)
GFR, Estimated: >60 mL/min (ref >60)
Glucose, Bld: 103 mg/dL — ABNORMAL HIGH (ref 70–99)
Potassium: 4.2 mmol/L (ref 3.5–5.1)
Sodium: 139 mmol/L (ref 135–145)
Total Bilirubin: 0.5 mg/dL (ref 0.3–1.2)
Total Protein: 6.6 g/dL (ref 6.5–8.1)

## 2022-10-01 MED ORDER — HEPARIN SOD (PORK) LOCK FLUSH 100 UNIT/ML IV SOLN
500.0000 [IU] | Freq: Once | INTRAVENOUS | Status: AC
  Administered 2022-10-01: 500 [IU] via INTRAVENOUS

## 2022-10-01 MED ORDER — SODIUM CHLORIDE 0.9% FLUSH
10.0000 mL | Freq: Once | INTRAVENOUS | Status: AC
  Administered 2022-10-01: 10 mL via INTRAVENOUS

## 2022-10-12 ENCOUNTER — Encounter: Payer: Self-pay | Admitting: Oncology

## 2022-10-13 ENCOUNTER — Ambulatory Visit: Payer: Medicare Other | Admitting: Internal Medicine

## 2022-10-13 ENCOUNTER — Encounter: Payer: Self-pay | Admitting: Oncology

## 2022-10-13 ENCOUNTER — Encounter: Payer: Self-pay | Admitting: Internal Medicine

## 2022-10-13 VITALS — BP 116/67 | HR 47 | Ht 64.0 in | Wt 126.6 lb

## 2022-10-13 DIAGNOSIS — I480 Paroxysmal atrial fibrillation: Secondary | ICD-10-CM | POA: Insufficient documentation

## 2022-10-13 NOTE — Progress Notes (Signed)
Primary Physician/Referring:  [name], MD  Patient ID: [name], male    DOB: Apr 30, 1956, 66 y.o.   MRN: [id number]  Chief Complaint  Patient presents with   Atrial Fibrillation   New Patient (Initial Visit)        HPI:    Robert Lowery  is a 66 y.o. 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 He was referred to cardiology by Dr. Mellody Drown for evaluation and management of new onset atrial fibrillation.  He was seen at his primary care provider office on 10/06/2022 and EKG revealed atrial fibrillation with controlled ventricular response.  He does not have a history of atrial fibrillation and no other cardiac history.  He was overall asymptomatic while in atrial fibrillation.  He denies chest pain, shortness of breath, dizziness, palpitations, syncope, leg swelling.  Overall, he is very active and follows a healthy diet.  Past Medical History:  Diagnosis Date   Bladder cancer ([place]) 05/10/2018   TUR of the BLADDER...DR. Jeffie Pollock   Chest pain    at rest   Hearing loss    right ear   Hepatitis    history of Hep. B   History of hiatal hernia 05/30/2016   Small retrocardiac, noted on CT   Internal hemorrhoids    Liver lesion    Lung cancer (HCC)    Mild cardiomegaly    Pre-diabetes    Scrotal nodule    Bilateral   Past Surgical History:  Procedure Laterality Date   BIOPSY TESTIS     bilateral testicle mass   COLONOSCOPY     CYSTOSCOPY/RETROGRADE/URETEROSCOPY N/A 05/10/2018   Procedure: BILATERAL RETROGRADE;  Surgeon: [name], MD;  Location: [facility];  Service: Urology;  Laterality: N/A;   IR IMAGING GUIDED PORT INSERTION  05/31/2019   THORACOTOMY Right 10/30/2019   Procedure: THORACOTOMY MAJOR;  Surgeon: Gaye Pollack, MD;  Location: [place] [facility];  Service: Thoracic;  Laterality: Right;   TRANSURETHRAL RESECTION OF BLADDER TUMOR WITH MITOMYCIN-C N/A 05/10/2018   Procedure: CYSTOSCOPY  TRANSURETHRAL RESECTION OF BLADDER TUMOR WITH MITOMYCIN-C;  Surgeon: [name], MD;  Location: [facility];  Service: Urology;  Laterality: N/A;   UPPER GI ENDOSCOPY     WEDGE RESECTION Right 10/30/2019   Procedure: WEDGE RESECTION;  Surgeon: Gaye Pollack, MD;  Location: [place];  Service: Thoracic;  Laterality: Right;   History reviewed. No pertinent family history.  Social History   Tobacco Use   Smoking status: Former   Smokeless tobacco: Never  Substance Use Topics   Alcohol use: Never   Marital Status: Married  ROS  Review of Systems  Cardiovascular:  Negative for chest pain, dyspnea on exertion, irregular heartbeat, leg swelling, near-syncope, orthopnea, palpitations and syncope.   Objective  Blood pressure 116/67, pulse (!) 47, height _0  (1.626 m), weight 126 lb 9.6 oz (57.4 kg), SpO2 99 %. Body mass index is 21.73 kg/m.     10/13/2022    9:53 AM 06/04/2022    9:06 AM 11/27/2021    9:19 AM  Vitals with BMI  Height _1  _2  _3   Weight 126 lbs 10 oz 129 lbs 3 oz 129 lbs 8 oz  BMI 21.72 64.40 34.74  Systolic 259 563 875  Diastolic 67 74 58  Pulse 47 68 46    Physical Exam Cardiovascular:     Rate and Rhythm: Normal rate and regular rhythm.  Pulses: Normal pulses.     Heart sounds: No murmur heard.    No gallop.  Pulmonary:     Effort: Pulmonary effort is normal.     Breath sounds: Normal breath sounds. No wheezing or rales.  Musculoskeletal:     Right lower leg: No edema.     Left lower leg: No edema.  Neurological:     Mental Status: He is alert.    Medications and allergies  No Known Allergies   Medication list after today's encounter   Current Outpatient Medications:    Calcium 200 MG TABS, Take by mouth., Disp: , Rfl:    cholecalciferol (VITAMIN D3) 25 MCG (1000 UT) tablet, Take 1,000 Units by mouth daily., Disp: , Rfl:    Menaquinone-7 (K2 PO), Take by mouth., Disp: , Rfl:   Laboratory examination:   Lab Results  Component Value Date    NA 139 10/01/2022   K 4.2 10/01/2022   CO2 29 10/01/2022   GLUCOSE 103 (H) 10/01/2022   BUN 27 (H) 10/01/2022   CREATININE 0.85 10/01/2022   CALCIUM 8.9 10/01/2022   GFRNONAA >60 10/01/2022       Latest Ref Rng & Units 10/01/2022    9:05 AM 05/28/2022    9:01 AM 11/20/2021   11:32 AM  CMP  Glucose 70 - 99 mg/dL 103  96  97   BUN 8 - 23 mg/dL _0 Creatinine 0.61 - 1.24 mg/dL 0.85  0.89  0.83   Sodium 135 - 145 mmol/L 139  141  140   Potassium 3.5 - 5.1 mmol/L 4.2  4.0  4.0   Chloride 98 - 111 mmol/L 105  108  105   CO2 22 - 32 mmol/L _1 Calcium 8.9 - 10.3 mg/dL 8.9  9.1  8.6   Total Protein 6.5 - 8.1 g/dL 6.6  6.4  6.6   Total Bilirubin 0.3 - 1.2 mg/dL 0.5  0.6  0.3   Alkaline Phos 38 - 126 U/L 40  41  41   AST 15 - 41 U/L _2 ALT 0 - 44 U/L _3 Latest Ref Rng & Units 10/01/2022    9:05 AM 05/28/2022    9:01 AM 11/20/2021   11:32 AM  CBC  WBC 4.0 - 10.5 K/uL 4.0  4.2  3.4   Hemoglobin 13.0 - 17.0 g/dL 12.8  12.6  12.7   Hematocrit 39.0 - 52.0 % 38.0  37.3  37.9   Platelets 150 - 400 K/uL 223  206  188     Lipid Panel No results for input(s): "CHOL", "TRIG", "LDLCALC", "VLDL", "HDL", "CHOLHDL", "LDLDIRECT" in the last 8760 hours.  HEMOGLOBIN A1C Lab Results  Component Value Date   HGBA1C 4.9 10/26/2019   MPG 93.93 10/26/2019   TSH No results for input(s): "TSH" in the last 8760 hours.  External labs:   10/06/2022: Sodium 143, potassium 4.5, glucose 82, BUN 18, creatinine 1.0, EGFR 81.7 Hemoglobin 14.3, hematocrit 43.8, MCV 93.3, platelet count 229 Cholesterol 165, triglycerides 71, LDL 92, HDL 59  Radiology:    Cardiac Studies:   No results found for this or any previous visit from the past 1095 days.    EKG:   EKG 10/13/2022: Sinus bradycardia first-degree AV block at rate of 47 bpm.  Normal axis.  LVH.  Compared to previous EKG completed  by primary care on 10/06/2022, sinus bradycardia has replaced atrial  fibrillation.  Assessment     ICD-10-CM   1. Paroxysmal atrial fibrillation (HCC)  I48.0 EKG 12-Lead    PCV ECHOCARDIOGRAM COMPLETE      CHA2DS2-VASc Score is 1.  Yearly risk of stroke: 0.6% (0.6).  Score of 1=0.6; 2=2.2; 3=3.2; 4=4.8; 5=7.2; 6=9.8; 7=>9.8) -(CHF; HTN; vasc disease DM,  Male = 1; Age <65 =0; 65-74 = 1,  >75 =2; stroke/embolism= 2).    Orders Placed This Encounter  Procedures   EKG 12-Lead   PCV ECHOCARDIOGRAM COMPLETE    Standing Status:   Future    Standing Expiration Date:   10/14/2023    No orders of the defined types were placed in this encounter.   Medications Discontinued During This Encounter  Medication Reason   Ascorbic Acid (VITAMIN C) 1000 MG tablet    promethazine (PHENERGAN) 25 MG tablet    prochlorperazine (COMPAZINE) 10 MG tablet    lidocaine-prilocaine (EMLA) cream    lansoprazole (PREVACID) 15 MG capsule    CINNAMON PO    Multiple Vitamin (MULTIVITAMIN WITH MINERALS) TABS tablet    aspirin 81 MG chewable tablet      Recommendations:   Paroxysmal atrial fibrillation (HCC) EKG at today's visit showed sinus bradycardia. Given episode atrial fibrillation and history of chemotherapy and radiation we will schedule for echocardiogram.. CHA2DS2-VASc Score is 1.  Yearly risk of stroke: 0.6%.  No need for anticoagulation at this time. Blood pressure is well controlled. Reviewed external labs, no significant findings.  Follow-up in 3 months or sooner if needed.   Floydene Flock, DO, [facility]  10/13/2022, 11:28 AM Office: [phone] Pager: [phone]

## 2022-11-18 ENCOUNTER — Ambulatory Visit: Payer: Medicare Other

## 2022-11-18 DIAGNOSIS — I480 Paroxysmal atrial fibrillation: Secondary | ICD-10-CM

## 2022-12-04 ENCOUNTER — Other Ambulatory Visit: Payer: Medicare Other

## 2022-12-11 ENCOUNTER — Ambulatory Visit: Payer: Medicare Other | Admitting: Oncology

## 2022-12-16 ENCOUNTER — Ambulatory Visit (HOSPITAL_COMMUNITY)
Admission: RE | Admit: 2022-12-16 | Discharge: 2022-12-16 | Disposition: A | Discharge status: Home / Self Care | Payer: Medicare Other | Source: Ambulatory Visit | Attending: Oncology | Admitting: Oncology

## 2022-12-16 DIAGNOSIS — C670 Malignant neoplasm of trigone of bladder: Secondary | ICD-10-CM | POA: Diagnosis not present

## 2022-12-16 LAB — GLUCOSE, CAPILLARY: Glucose-Capillary: 90 mg/dL (ref 70–99)

## 2022-12-16 MED ORDER — FLUDEOXYGLUCOSE F - 18 (FDG) INJECTION
6.3000 | Freq: Once | INTRAVENOUS | Status: AC
  Administered 2022-12-16: 6.1 via INTRAVENOUS

## 2022-12-18 ENCOUNTER — Inpatient Hospital Stay: Payer: Medicare Other | Attending: Oncology

## 2022-12-18 ENCOUNTER — Inpatient Hospital Stay: Payer: Medicare Other

## 2022-12-18 ENCOUNTER — Inpatient Hospital Stay (HOSPITAL_BASED_OUTPATIENT_CLINIC_OR_DEPARTMENT_OTHER): Payer: Medicare Other | Admitting: Oncology

## 2022-12-18 ENCOUNTER — Other Ambulatory Visit: Payer: Self-pay

## 2022-12-18 VITALS — BP 131/66 | HR 45 | Temp 97.6°F | Resp 15 | Wt 127.9 lb

## 2022-12-18 DIAGNOSIS — C670 Malignant neoplasm of trigone of bladder: Secondary | ICD-10-CM

## 2022-12-18 DIAGNOSIS — Z95828 Presence of other vascular implants and grafts: Secondary | ICD-10-CM

## 2022-12-18 DIAGNOSIS — Z8551 Personal history of malignant neoplasm of bladder: Secondary | ICD-10-CM | POA: Diagnosis not present

## 2022-12-18 DIAGNOSIS — Z923 Personal history of irradiation: Secondary | ICD-10-CM | POA: Diagnosis not present

## 2022-12-18 DIAGNOSIS — D71 Functional disorders of polymorphonuclear neutrophils: Secondary | ICD-10-CM | POA: Insufficient documentation

## 2022-12-18 LAB — CMP (CANCER CENTER ONLY)
ALT: 17 U/L (ref 0–44)
AST: 18 U/L (ref 15–41)
Albumin: 4.4 g/dL (ref 3.5–5.0)
Alkaline Phosphatase: 48 U/L (ref 38–126)
Anion gap: 5 (ref 5–15)
BUN: 22 mg/dL (ref 8–23)
CO2: 30 mmol/L (ref 22–32)
Calcium: 9.3 mg/dL (ref 8.9–10.3)
Chloride: 106 mmol/L (ref 98–111)
Creatinine: 1.05 mg/dL (ref 0.61–1.24)
GFR, Estimated: >60 mL/min (ref >60)
Glucose, Bld: 87 mg/dL (ref 70–99)
Potassium: 4.2 mmol/L (ref 3.5–5.1)
Sodium: 141 mmol/L (ref 135–145)
Total Bilirubin: 0.5 mg/dL (ref 0.3–1.2)
Total Protein: 6.4 g/dL — ABNORMAL LOW (ref 6.5–8.1)

## 2022-12-18 LAB — CBC WITH DIFFERENTIAL (CANCER CENTER ONLY)
Abs Immature Granulocytes: 0.02 10*3/uL (ref 0.00–0.07)
Basophils Absolute: 0 10*3/uL (ref 0.0–0.1)
Basophils Relative: 1 %
Eosinophils Absolute: 0.1 10*3/uL (ref 0.0–0.5)
Eosinophils Relative: 2 %
HCT: 43.5 % (ref 39.0–52.0)
Hemoglobin: 14.5 g/dL (ref 13.0–17.0)
Immature Granulocytes: 1 %
Lymphocytes Relative: 47 %
Lymphs Abs: 1.8 10*3/uL (ref 0.7–4.0)
MCH: 30.6 pg (ref 26.0–34.0)
MCHC: 33.3 g/dL (ref 30.0–36.0)
MCV: 91.8 fL (ref 80.0–100.0)
Monocytes Absolute: 0.3 10*3/uL (ref 0.1–1.0)
Monocytes Relative: 7 %
Neutro Abs: 1.6 10*3/uL — ABNORMAL LOW (ref 1.7–7.7)
Neutrophils Relative %: 42 %
Platelet Count: 210 10*3/uL (ref 150–400)
RBC: 4.74 MIL/uL (ref 4.22–5.81)
RDW: 13 % (ref 11.5–15.5)
WBC Count: 3.8 10*3/uL — ABNORMAL LOW (ref 4.0–10.5)
nRBC: 0 % (ref 0.0–0.2)

## 2022-12-18 MED ORDER — HEPARIN SOD (PORK) LOCK FLUSH 100 UNIT/ML IV SOLN
500.0000 [IU] | Freq: Once | INTRAVENOUS | Status: AC
  Administered 2022-12-18: 500 [IU]

## 2022-12-18 MED ORDER — SODIUM CHLORIDE 0.9% FLUSH
10.0000 mL | Freq: Once | INTRAVENOUS | Status: AC
  Administered 2022-12-18: 10 mL

## 2022-12-18 NOTE — Progress Notes (Signed)
Hematology and Oncology Follow Up Visit  Robert Lowery [id number] 1955-12-31 67 y.o. 12/18/2022 9:16 AM [name], MDMoreira, [name], MD   Principle Diagnosis: 67 year old man with bladder cancer diagnosed in 2019 with localized disease.  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   Prior Therapy:   He is status post cystectomy done in July 2019.  Surgery performed in [place].  He subsequently developed a pulmonary nodule.  He is status post lung biopsy completed on June 2 of 2020.  Gemcitabine and cisplatin chemotherapy cycle 1 started on 06/02/2019.  He completed 6 cycles of therapy on September 21, 2019.  He had a near complete response with a residual pulmonary nodule.  He is status post wedge resection of right upper lobe residual tumor using a right thoracotomy completed on October 30, 2019.  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  He is status post radiation therapy to the resected right upper lung.  He received 50 Gray in 5 fractions in March 2021.    Current therapy: Active surveillance.    Interim History: Mr. Wyles returns today for a follow-up visit.  Since the last visit, he reports no major changes in his health.  He continues to feel well without any issues.  He denies any nausea or vomiting or abdominal pain.  He denies any hospitalizations or illnesses.  He denies any shortness of breath, cough or wheezing.              Medications: Reviewed without changes. Current Outpatient Medications  Medication Sig Dispense Refill   Calcium 200 MG TABS Take by mouth.     cholecalciferol (VITAMIN D3) 25 MCG (1000 UT) tablet Take 1,000 Units by mouth daily.     Menaquinone-7 (K2 PO) Take by mouth.     No current facility-administered medications for this visit.     Allergies: No Known Allergies   Physical exam       ECOG 0    General appearance: Alert,  awake without any distress. Head: Atraumatic without abnormalities Oropharynx: Without any thrush or ulcers. Eyes: No scleral icterus. Lymph nodes: No lymphadenopathy noted in the cervical, supraclavicular, or axillary nodes Heart:regular rate and rhythm, without any murmurs or gallops.   Lung: Clear to auscultation without any rhonchi, wheezes or dullness to percussion. Abdomin: Soft, nontender without any shifting dullness or ascites. Musculoskeletal: No clubbing or cyanosis. Neurological: No motor or sensory deficits. Skin: No rashes or lesions.        .      Lab Results: Lab Results  Component Value Date   WBC 4.0 10/01/2022   HGB 12.8 (L) 10/01/2022   HCT 38.0 (L) 10/01/2022   MCV 89.2 10/01/2022   PLT 223 10/01/2022     Chemistry      Component Value Date/Time   NA 139 10/01/2022 0905   K 4.2 10/01/2022 0905   CL 105 10/01/2022 0905   CO2 29 10/01/2022 0905   BUN 27 (H) 10/01/2022 0905   CREATININE 0.85 10/01/2022 0905      Component Value Date/Time   CALCIUM 8.9 10/01/2022 0905   ALKPHOS 40 10/01/2022 0905   AST 23 10/01/2022 0905   ALT 23 10/01/2022 0905   BILITOT 0.5 10/01/2022 0905      Narrative & Impression  CLINICAL DATA:  Subsequent treatment strategy for bladder cancer.   EXAM: NUCLEAR MEDICINE PET SKULL BASE TO THIGH   TECHNIQUE: 6.18 mCi  F-18 FDG was injected intravenously. Full-ring PET imaging was performed from the skull base to thigh after the radiotracer. CT data was obtained and used for attenuation correction and anatomic localization.   Fasting blood glucose: 90 mg/dl   COMPARISON:  Multiple priors including PET-CT April 09, 2022   FINDINGS: Mediastinal blood pool activity: SUV max 1.9   Liver activity: SUV max NA   NECK: No hypermetabolic cervical adenopathy.   Symmetric hypermetabolic hyperplasia of the tonsils is commonly reactive.   Incidental CT findings: None.   CHEST: No significant interval change in the  FDG avid hilar and mediastinal lymph nodes. Index lymph nodes are as follows:   -right hilar lymph node has a max SUV of 9.2, unchanged.   -aortopulmonic window lymph node has a max SUV of 5.5 previously 5.7.   Similar low-level FDG avidity associated with the right upper lobe granulomas and scarring with a max SUV of 2.4 previously 2.0, findings compatible with prior granulomatous disease.   No new suspicious hypermetabolic pulmonary nodules or masses.   Incidental CT findings: Stable changes of prior right upper lobe wedge resection. Right chest wall Port-A-Cath with tip at the superior cavoatrial junction.   ABDOMEN/PELVIS: No abnormal hypermetabolic activity within the liver, pancreas, adrenal glands, or spleen.   No hypermetabolic lymph nodes in the abdomen or pelvis.   Incidental CT findings: Surgical changes of prior cystoprostatectomy with neobladder creation are similar to prior. New mild prominence of the bilateral collecting systems and ureters. Right lower quadrant enterotomy sutures. Stable non hypermetabolic hypodense hepatic lesions compatible with cysts.   SKELETON: No focal hypermetabolic activity to suggest skeletal metastasis.   Incidental CT findings: Multilevel degenerative changes spine with mild multifocal degenerative joint disease.   IMPRESSION: 1. Status post cystoprostatectomy and pelvic neobladder creation without convincing evidence of hypermetabolic local recurrence or distant metastatic disease. 2. New mild prominence of the bilateral collecting systems and ureters. 3. Similar thoracic findings compatible with granulomatous disease including hypermetabolic mediastinal/hilar lymph nodes, calcified thoracic lymph nodes and pulmonary granulomata.     Impression and Plan:   67 year old man with   1.  Bladder cancer diagnosed in 2019.  He developed stage IV with documented pulmonary metastasis in 2020.  He continues to be without any  evidence of disease after chemotherapy and surgical resection.    His disease status was updated at this time and treatment choices were reviewed.  PET scan obtained on December 16, 2022 continues to show no evidence of disease at this time.  I recommended continued active surveillance rather immunotherapy maintenance which was approved well after his treatment has been completed and the role of using it at this stage is unclear.  I prefer to use it as a salvage option in the future if his disease relapse.  For the time being, he will continue to have repeat imaging studies in the next 6 months for the next 2 years. Salvage therapy with immunotherapy alone or in combination with antibody drug conjugate (Padcev) could be instituted.    2.  Anemia: Resolved at this time with hemoglobin has normalized.  3.  IV access: Risks and benefits of Port-A-Cath removal was discussed at this time.  He opted to keep it at least for the next scan with consideration to remove it after that.  He will continue to have Port-A-Cath flush.  4.  Granulomatous disease: PET scan continues to show stability of these findings without any need for intervention.  5.  Follow-up: Port-A-Cath flush  every 2 months and repeat imaging studies and follow-up in 6 months.     30  minutes were dedicated to this visit.  The time was spent on updating disease status, treatment choices and outlining future plan of care review.         Zola Button, MD 1/5/20249:16 AM

## 2023-01-13 ENCOUNTER — Ambulatory Visit: Payer: Medicare Other | Admitting: Internal Medicine

## 2023-01-13 ENCOUNTER — Encounter: Payer: Self-pay | Admitting: Internal Medicine

## 2023-01-13 VITALS — BP 124/55 | HR 52 | Wt 130.6 lb

## 2023-01-13 DIAGNOSIS — I480 Paroxysmal atrial fibrillation: Secondary | ICD-10-CM

## 2023-01-13 NOTE — Progress Notes (Unsigned)
Primary Physician/Referring:  [name], MD  Patient ID: Robert Lowery, male    DOB: [birth year], 67 y.o.   MRN: [id number]  Chief Complaint  Patient presents with   Atrial Fibrillation   Follow-up   HPI:    Robert Lowery  is a 67 y.o. 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 He was referred to cardiology by Dr. Mellody Drown for evaluation and management of new onset atrial fibrillation.  Patient presents for follow-up visit. He has been doing well since the last time he was here. He has not been having any issues when exerting himself. No concerns or complaints today.  He denies chest pain, shortness of breath, dizziness, palpitations, syncope, leg swelling.  t.  Past Medical History:  Diagnosis Date   Bladder cancer ([place]) 05/10/2018   TUR of the BLADDER...DR. Jeffie Pollock   Chest pain    at rest   Hearing loss    right ear   Hepatitis    history of Hep. B   History of hiatal hernia 05/30/2016   Small retrocardiac, noted on CT   Internal hemorrhoids    Liver lesion    Lung cancer (HCC)    Mild cardiomegaly    Pre-diabetes    Scrotal nodule    Bilateral   Past Surgical History:  Procedure Laterality Date   BIOPSY TESTIS     bilateral testicle mass   COLONOSCOPY     CYSTOSCOPY/RETROGRADE/URETEROSCOPY N/A 05/10/2018   Procedure: BILATERAL RETROGRADE;  Surgeon: [name], MD;  Location: [facility];  Service: Urology;  Laterality: N/A;   IR IMAGING GUIDED PORT INSERTION  05/31/2019   THORACOTOMY Right 10/30/2019   Procedure: THORACOTOMY MAJOR;  Surgeon: Gaye Pollack, MD;  Location: [place] [facility];  Service: Thoracic;  Laterality: Right;   TRANSURETHRAL RESECTION OF BLADDER TUMOR WITH MITOMYCIN-C N/A 05/10/2018   Procedure: CYSTOSCOPY TRANSURETHRAL RESECTION OF BLADDER TUMOR WITH MITOMYCIN-C;  Surgeon: [name], MD;  [facility];  Service: Urology;  Laterality: N/A;   UPPER GI ENDOSCOPY     WEDGE  RESECTION Right 10/30/2019   Procedure: WEDGE RESECTION;  Surgeon: Gaye Pollack, MD;  Location: [place];  Service: Thoracic;  Laterality: Right;   History reviewed. No pertinent family history.  Social History   Tobacco Use   Smoking status: Former   Smokeless tobacco: Never  Substance Use Topics   Alcohol use: Never   Marital Status: Married  ROS  Review of Systems  Cardiovascular:  Negative for chest pain, dyspnea on exertion, irregular heartbeat, leg swelling, near-syncope, orthopnea, palpitations and syncope.   Objective  Blood pressure (!) 124/55, pulse (!) 52, weight 130 lb 9.6 oz (59.2 kg), SpO2 97 %. Body mass index is 22.42 kg/m.     01/13/2023   10:08 AM 12/18/2022    9:40 AM 10/13/2022    9:53 AM  Vitals with BMI  Height   5\' 4"   Weight 130 lbs 10 oz 127 lbs 14 oz 126 lbs 10 oz  BMI   36.14  Systolic 431 540 086  Diastolic 55 66 67  Pulse 52 45 47    Physical Exam Cardiovascular:     Rate and Rhythm: Normal rate and regular rhythm.     Pulses: Normal pulses.     Heart sounds: No murmur heard.    No gallop.  Pulmonary:     Effort: Pulmonary effort is normal.     Breath sounds:  Normal breath sounds. No wheezing or rales.  Musculoskeletal:     Right lower leg: No edema.     Left lower leg: No edema.  Neurological:     Mental Status: He is alert.    Medications and allergies  No Known Allergies   Medication list after today's encounter   Current Outpatient Medications:    Calcium 200 MG TABS, Take by mouth., Disp: , Rfl:    cholecalciferol (VITAMIN D3) 25 MCG (1000 UT) tablet, Take 1,000 Units by mouth daily., Disp: , Rfl:    Menaquinone-7 (K2 PO), Take by mouth., Disp: , Rfl:   Laboratory examination:   Lab Results  Component Value Date   NA 141 12/18/2022   K 4.2 12/18/2022   CO2 30 12/18/2022   GLUCOSE 87 12/18/2022   BUN 22 12/18/2022   CREATININE 1.05 12/18/2022   CALCIUM 9.3 12/18/2022   GFRNONAA >60 12/18/2022       Latest Ref Rng  & Units 12/18/2022    9:24 AM 10/01/2022    9:05 AM 05/28/2022    9:01 AM  CMP  Glucose 70 - 99 mg/dL 87  103  96   BUN 8 - 23 mg/dL 22  27  22    Creatinine 0.61 - 1.24 mg/dL 1.05  0.85  0.89   Sodium 135 - 145 mmol/L 141  139  141   Potassium 3.5 - 5.1 mmol/L 4.2  4.2  4.0   Chloride 98 - 111 mmol/L 106  105  108   CO2 22 - 32 mmol/L 30  29  30    Calcium 8.9 - 10.3 mg/dL 9.3  8.9  9.1   Total Protein 6.5 - 8.1 g/dL 6.4  6.6  6.4   Total Bilirubin 0.3 - 1.2 mg/dL 0.5  0.5  0.6   Alkaline Phos 38 - 126 U/L 48  40  41   AST 15 - 41 U/L 18  23  19    ALT 0 - 44 U/L 17  23  16        Latest Ref Rng & Units 12/18/2022    9:24 AM 10/01/2022    9:05 AM 05/28/2022    9:01 AM  CBC  WBC 4.0 - 10.5 K/uL 3.8  4.0  4.2   Hemoglobin 13.0 - 17.0 g/dL 14.5  12.8  12.6   Hematocrit 39.0 - 52.0 % 43.5  38.0  37.3   Platelets 150 - 400 K/uL 210  223  206     Lipid Panel No results for input(s): "CHOL", "TRIG", "[place]", "VLDL", "HDL", "CHOLHDL", "LDLDIRECT" in the last 8760 hours.  HEMOGLOBIN A1C Lab Results  Component Value Date   HGBA1C 4.9 10/26/2019   MPG 93.93 10/26/2019   TSH No results for input(s): "TSH" in the last 8760 hours.  External labs:   10/06/2022: Sodium 143, potassium 4.5, glucose 82, BUN 18, creatinine 1.0, EGFR 81.7 Hemoglobin 14.3, hematocrit 43.8, MCV 93.3, platelet count 229 Cholesterol 165, triglycerides 71, LDL 92, HDL 59  Radiology:    Cardiac Studies:   Echocardiogram 11/18/2022: Normal LV systolic function with visual EF 60-65%. Left ventricle cavity is normal in size. Normal left ventricular wall thickness. Normal global wall motion. Normal diastolic filling pattern, normal LAP. No significant valvular heart disease. No prior study for comparison.      EKG:   EKG 10/13/2022: Sinus bradycardia first-degree AV block at rate of 47 bpm.  Normal axis.  LVH.  Compared to previous EKG completed by primary care on  10/06/2022, sinus bradycardia has replaced  atrial fibrillation.  Assessment     ICD-10-CM   1. Paroxysmal atrial fibrillation (HCC)  I48.0 EKG 12-Lead      CHA2DS2-VASc Score is 1.  Yearly risk of stroke: 0.6% (0.6).  Score of 1=0.6; 2=2.2; 3=3.2; 4=4.8; 5=7.2; 6=9.8; 7=>9.8) -(CHF; HTN; vasc disease DM,  Male = 1; Age <65 =0; 65-74 = 1,  >75 =2; stroke/embolism= 2).    Orders Placed This Encounter  Procedures   EKG 12-Lead    No orders of the defined types were placed in this encounter.   There are no discontinued medications.    Recommendations:   Paroxysmal atrial fibrillation (HCC) CHA2DS2-VASc Score is 1.  Yearly risk of stroke: 0.6%.  No need for anticoagulation at this time. Blood pressure is well controlled. Reviewed external labs, no significant findings.  Follow-up in 6 months or sooner if needed.   Floydene Flock, DO, [facility]  01/14/2023, 1:42 PM Office: [phone] Pager: [phone]

## 2023-02-16 ENCOUNTER — Inpatient Hospital Stay: Payer: Medicare Other | Attending: Oncology

## 2023-02-16 DIAGNOSIS — Z8551 Personal history of malignant neoplasm of bladder: Secondary | ICD-10-CM | POA: Insufficient documentation

## 2023-02-17 ENCOUNTER — Inpatient Hospital Stay: Payer: Medicare Other

## 2023-02-17 DIAGNOSIS — C670 Malignant neoplasm of trigone of bladder: Secondary | ICD-10-CM

## 2023-02-17 DIAGNOSIS — Z95828 Presence of other vascular implants and grafts: Secondary | ICD-10-CM

## 2023-02-17 DIAGNOSIS — Z8551 Personal history of malignant neoplasm of bladder: Secondary | ICD-10-CM | POA: Diagnosis present

## 2023-02-17 MED ORDER — SODIUM CHLORIDE 0.9% FLUSH
10.0000 mL | Freq: Once | INTRAVENOUS | Status: AC
  Administered 2023-02-17: 10 mL

## 2023-02-17 MED ORDER — HEPARIN SOD (PORK) LOCK FLUSH 100 UNIT/ML IV SOLN
500.0000 [IU] | Freq: Once | INTRAVENOUS | Status: AC
  Administered 2023-02-17: 500 [IU]

## 2023-04-20 ENCOUNTER — Inpatient Hospital Stay: Payer: Medicare Other

## 2023-04-29 ENCOUNTER — Other Ambulatory Visit: Payer: Self-pay

## 2023-04-29 ENCOUNTER — Inpatient Hospital Stay: Payer: Medicare Other | Attending: Oncology

## 2023-04-29 DIAGNOSIS — Z95828 Presence of other vascular implants and grafts: Secondary | ICD-10-CM

## 2023-04-29 DIAGNOSIS — Z8551 Personal history of malignant neoplasm of bladder: Secondary | ICD-10-CM | POA: Insufficient documentation

## 2023-04-29 DIAGNOSIS — C670 Malignant neoplasm of trigone of bladder: Secondary | ICD-10-CM

## 2023-04-29 MED ORDER — SODIUM CHLORIDE 0.9% FLUSH
10.0000 mL | Freq: Once | INTRAVENOUS | Status: AC
  Administered 2023-04-29: 10 mL

## 2023-04-29 MED ORDER — HEPARIN SOD (PORK) LOCK FLUSH 100 UNIT/ML IV SOLN
500.0000 [IU] | Freq: Once | INTRAVENOUS | Status: AC
  Administered 2023-04-29: 500 [IU]

## 2023-06-22 ENCOUNTER — Other Ambulatory Visit: Payer: Self-pay | Admitting: Hematology and Oncology

## 2023-06-22 ENCOUNTER — Inpatient Hospital Stay: Payer: Medicare Other | Attending: Oncology

## 2023-06-22 ENCOUNTER — Other Ambulatory Visit: Payer: Self-pay

## 2023-06-22 DIAGNOSIS — Z923 Personal history of irradiation: Secondary | ICD-10-CM | POA: Diagnosis not present

## 2023-06-22 DIAGNOSIS — Z95828 Presence of other vascular implants and grafts: Secondary | ICD-10-CM

## 2023-06-22 DIAGNOSIS — Z8551 Personal history of malignant neoplasm of bladder: Secondary | ICD-10-CM | POA: Diagnosis present

## 2023-06-22 DIAGNOSIS — Z87891 Personal history of nicotine dependence: Secondary | ICD-10-CM | POA: Insufficient documentation

## 2023-06-22 DIAGNOSIS — C670 Malignant neoplasm of trigone of bladder: Secondary | ICD-10-CM

## 2023-06-22 DIAGNOSIS — Z9221 Personal history of antineoplastic chemotherapy: Secondary | ICD-10-CM | POA: Insufficient documentation

## 2023-06-22 LAB — CBC WITH DIFFERENTIAL (CANCER CENTER ONLY)
Abs Immature Granulocytes: 0.01 10*3/uL (ref 0.00–0.07)
Basophils Absolute: 0 10*3/uL (ref 0.0–0.1)
Basophils Relative: 1 %
Eosinophils Absolute: 0.1 10*3/uL (ref 0.0–0.5)
Eosinophils Relative: 2 %
HCT: 39.6 % (ref 39.0–52.0)
Hemoglobin: 13.2 g/dL (ref 13.0–17.0)
Immature Granulocytes: 0 %
Lymphocytes Relative: 44 %
Lymphs Abs: 1.8 10*3/uL (ref 0.7–4.0)
MCH: 30.3 pg (ref 26.0–34.0)
MCHC: 33.3 g/dL (ref 30.0–36.0)
MCV: 91 fL (ref 80.0–100.0)
Monocytes Absolute: 0.4 10*3/uL (ref 0.1–1.0)
Monocytes Relative: 8 %
Neutro Abs: 1.9 10*3/uL (ref 1.7–7.7)
Neutrophils Relative %: 45 %
Platelet Count: 194 10*3/uL (ref 150–400)
RBC: 4.35 MIL/uL (ref 4.22–5.81)
RDW: 13.2 % (ref 11.5–15.5)
WBC Count: 4.2 10*3/uL (ref 4.0–10.5)
nRBC: 0 % (ref 0.0–0.2)

## 2023-06-22 LAB — CMP (CANCER CENTER ONLY)
ALT: 14 U/L (ref 0–44)
AST: 16 U/L (ref 15–41)
Albumin: 3.9 g/dL (ref 3.5–5.0)
Alkaline Phosphatase: 41 U/L (ref 38–126)
Anion gap: 4 — ABNORMAL LOW (ref 5–15)
BUN: 18 mg/dL (ref 8–23)
CO2: 27 mmol/L (ref 22–32)
Calcium: 8.8 mg/dL — ABNORMAL LOW (ref 8.9–10.3)
Chloride: 109 mmol/L (ref 98–111)
Creatinine: 0.95 mg/dL (ref 0.61–1.24)
GFR, Estimated: >60 mL/min (ref >60)
Glucose, Bld: 84 mg/dL (ref 70–99)
Potassium: 4 mmol/L (ref 3.5–5.1)
Sodium: 140 mmol/L (ref 135–145)
Total Bilirubin: 0.7 mg/dL (ref 0.3–1.2)
Total Protein: 6.5 g/dL (ref 6.5–8.1)

## 2023-06-22 MED ORDER — SODIUM CHLORIDE 0.9% FLUSH
10.0000 mL | Freq: Once | INTRAVENOUS | Status: AC
  Administered 2023-06-22: 10 mL

## 2023-06-22 MED ORDER — HEPARIN SOD (PORK) LOCK FLUSH 100 UNIT/ML IV SOLN
500.0000 [IU] | Freq: Once | INTRAVENOUS | Status: AC
  Administered 2023-06-22: 500 [IU]

## 2023-06-25 ENCOUNTER — Encounter (HOSPITAL_COMMUNITY)
Admission: RE | Admit: 2023-06-25 | Discharge: 2023-06-25 | Disposition: A | Discharge status: Home / Self Care | Payer: Medicare Other | Source: Ambulatory Visit | Attending: Oncology | Admitting: Oncology

## 2023-06-25 DIAGNOSIS — C670 Malignant neoplasm of trigone of bladder: Secondary | ICD-10-CM | POA: Diagnosis not present

## 2023-06-25 LAB — GLUCOSE, CAPILLARY: Glucose-Capillary: 90 mg/dL (ref 70–99)

## 2023-06-25 MED ORDER — FLUDEOXYGLUCOSE F - 18 (FDG) INJECTION
6.5800 | Freq: Once | INTRAVENOUS | Status: AC
  Administered 2023-06-25: 6.58 via INTRAVENOUS

## 2023-06-29 ENCOUNTER — Inpatient Hospital Stay (HOSPITAL_BASED_OUTPATIENT_CLINIC_OR_DEPARTMENT_OTHER): Payer: Medicare Other | Admitting: Hematology and Oncology

## 2023-06-29 ENCOUNTER — Other Ambulatory Visit: Payer: Self-pay

## 2023-06-29 VITALS — BP 114/66 | HR 52 | Temp 98.0°F | Resp 13 | Wt 125.5 lb

## 2023-06-29 DIAGNOSIS — Z8551 Personal history of malignant neoplasm of bladder: Secondary | ICD-10-CM | POA: Diagnosis not present

## 2023-06-29 DIAGNOSIS — C670 Malignant neoplasm of trigone of bladder: Secondary | ICD-10-CM | POA: Diagnosis not present

## 2023-06-29 NOTE — Progress Notes (Signed)
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  He subsequently developed a pulmonary nodule.  05/16/2019: lung biopsy completed  06/02/2019: Gemcitabine and cisplatin chemotherapy cycle 1 started  09/21/2019: completed 6 cycles of therapy, had a near complete response with a residual pulmonary nodule.  10/30/2019: wedge resection of right upper lobe residual tumor using a right thoracotomy, pathology confirmed the presence of residual urothelial carcinoma.  02/2020: radiation therapy to the resected right upper lung, 50 Gray in 5 fractions  12/18/2022: last visit with Dr. Clelia Croft  Interval History:  Robert Lowery 67 y.o. male with medical history significant for stage IV high grade urothelial carcinoma of the bladder who presents for a follow up visit. The patient's last visit was on 12/18/2022 with Dr. Clelia Croft. In the interim since the last visit he had a PET CT scan on 06/25/2023 which showed no evidence of residual or recurrent disease.  On exam today our conversation is assisted with a hospital approved interpreter.  Robert Lowery reports he has been well overall since his last visit in January.  He notes that he does have some issues with a lack of sensation near his right nipple.  He reports he can also be sore and it did occur after he received radiation therapy.  He does have some occasional pains on the bottom of his feet as well.  He notes that he does not trip or stumble and feels unsteady on his feet.  He notes his appetite is good and his energy levels are strong.  He reports that he is doing good "for an old man".  He feels like he has good health.  About 3 months ago he did have a COVID infection and he does still have some residual cough but no other  infectious symptoms.  He denies any fevers, chills, sweats, nausea, vomiting or diarrhea.  A full 10 point ROS is otherwise negative.  MEDICAL HISTORY:  Past Medical History:  Diagnosis Date   Bladder cancer (HCC) 05/10/2018   TUR of the BLADDER...DR. Annabell Howells   Chest pain    at rest   Hearing loss    right ear   Hepatitis    history of Hep. B   History of hiatal hernia 05/30/2016   Small retrocardiac, noted on CT   Internal hemorrhoids    Liver lesion    Lung cancer (HCC)    Mild cardiomegaly    Pre-diabetes    Scrotal nodule    Bilateral    SURGICAL HISTORY: Past Surgical History:  Procedure Laterality Date   BIOPSY TESTIS     bilateral testicle mass   COLONOSCOPY     CYSTOSCOPY/RETROGRADE/URETEROSCOPY N/A 05/10/2018   Procedure: BILATERAL RETROGRADE;  Surgeon: Bjorn Pippin, MD;  Location: [facility];  Service: Urology;  Laterality: N/A;   IR IMAGING GUIDED PORT INSERTION  05/31/2019   THORACOTOMY Right 10/30/2019   Procedure: THORACOTOMY MAJOR;  Surgeon: Alleen Borne, MD;  Location: [facility] OR;  Service: Thoracic;  Laterality: Right;   TRANSURETHRAL RESECTION OF BLADDER TUMOR WITH MITOMYCIN-C N/A 05/10/2018   Procedure: CYSTOSCOPY TRANSURETHRAL RESECTION OF BLADDER TUMOR WITH MITOMYCIN-C;  Surgeon: Bjorn Pippin, MD;  Location: [facility];  Service: Urology;  Laterality: N/A;   UPPER GI ENDOSCOPY     WEDGE RESECTION Right  10/30/2019   Procedure: WEDGE RESECTION;  Surgeon: Alleen Borne, MD;  Location: [facility];  Service: Thoracic;  Laterality: Right;    SOCIAL HISTORY: Social History   Socioeconomic History   Marital status: Married    Spouse name: Not on file   Number of children: Not on file   Years of education: Not on file   Highest education level: Not on file  Occupational History   Not on file  Tobacco Use   Smoking status: Former   Smokeless tobacco: Never  Vaping Use   Vaping status: Never Used  Substance and Sexual Activity   Alcohol use: Never   Drug use: Never    Sexual activity: Not on file  Other Topics Concern   Not on file  Social History Narrative   Not on file   Social Determinants of Health   Financial Resource Strain: Not on file  Food Insecurity: Not on file  Transportation Needs: Not on file  Physical Activity: Not on file  Stress: Not on file  Social Connections: Not on file  Intimate Partner Violence: Not on file    FAMILY HISTORY: No family history on file.  ALLERGIES:  has No Known Allergies.  MEDICATIONS:  Current Outpatient Medications  Medication Sig Dispense Refill   Calcium 200 MG TABS Take by mouth.     cholecalciferol (VITAMIN D3) 25 MCG (1000 UT) tablet Take 1,000 Units by mouth daily.     Menaquinone-7 (K2 PO) Take by mouth.     No current facility-administered medications for this visit.    REVIEW OF SYSTEMS:   Constitutional: ( - ) fevers, ( - )  chills , ( - ) night sweats Eyes: ( - ) blurriness of vision, ( - ) double vision, ( - ) watery eyes Ears, nose, mouth, throat, and face: ( - ) mucositis, ( - ) sore throat Respiratory: ( - ) cough, ( - ) dyspnea, ( - ) wheezes Cardiovascular: ( - ) palpitation, ( - ) chest discomfort, ( - ) lower extremity swelling Gastrointestinal:  ( - ) nausea, ( - ) heartburn, ( - ) change in bowel habits Skin: ( - ) abnormal skin rashes Lymphatics: ( - ) new lymphadenopathy, ( - ) easy bruising Neurological: ( - ) numbness, ( - ) tingling, ( - ) new weaknesses Behavioral/Psych: ( - ) mood change, ( - ) new changes  All other systems were reviewed with the patient and are negative.  PHYSICAL EXAMINATION: ECOG PERFORMANCE STATUS: 0 - Asymptomatic  Vitals:   06/29/23 1027  BP: 114/66  Pulse: (!) 52  Resp: 13  Temp: 98 F (36.7 C)  SpO2: 100%   Filed Weights   06/29/23 1027  Weight: 125 lb 8 oz (56.9 kg)    GENERAL: well appearing elderly [place] male alert, no distress and comfortable SKIN: skin color, texture, turgor are normal, no rashes or significant  lesions EYES: conjunctiva are pink and non-injected, sclera clear LUNGS: clear to auscultation and percussion with normal breathing effort HEART: regular rate & rhythm and no murmurs and no lower extremity edema Musculoskeletal: no cyanosis of digits and no clubbing  PSYCH: alert & oriented x 3, fluent speech NEURO: no focal motor/sensory deficits  LABORATORY DATA:  I have reviewed the data as listed    Latest Ref Rng & Units 06/22/2023    9:02 AM 12/18/2022    9:24 AM 10/01/2022    9:05 AM  CBC  WBC 4.0 - 10.5 K/uL 4.2  3.8  4.0   Hemoglobin 13.0 - 17.0 g/dL 66.4  40.3  47.4   Hematocrit 39.0 - 52.0 % 39.6  43.5  38.0   Platelets 150 - 400 K/uL 194  210  223        Latest Ref Rng & Units 06/22/2023    9:02 AM 12/18/2022    9:24 AM 10/01/2022    9:05 AM  CMP  Glucose 70 - 99 mg/dL 84  87  259   BUN 8 - 23 mg/dL 18  22  27    Creatinine 0.61 - 1.24 mg/dL 5.63  8.75  6.43   Sodium 135 - 145 mmol/L 140  141  139   Potassium 3.5 - 5.1 mmol/L 4.0  4.2  4.2   Chloride 98 - 111 mmol/L 109  106  105   CO2 22 - 32 mmol/L 27  30  29    Calcium 8.9 - 10.3 mg/dL 8.8  9.3  8.9   Total Protein 6.5 - 8.1 g/dL 6.5  6.4  6.6   Total Bilirubin 0.3 - 1.2 mg/dL 0.7  0.5  0.5   Alkaline Phos 38 - 126 U/L 41  48  40   AST 15 - 41 U/L 16  18  23    ALT 0 - 44 U/L 14  17  23      RADIOGRAPHIC STUDIES: NM PET Image Restag (PS) Skull Base To Thigh  Result Date: 06/29/2023 CLINICAL DATA:  Subsequent treatment strategy for invasive bladder cancer. EXAM: NUCLEAR MEDICINE PET SKULL BASE TO THIGH TECHNIQUE: 6.58 mCi F-18 FDG was injected intravenously. Full-ring PET imaging was performed from the skull base to thigh after the radiotracer. CT data was obtained and used for attenuation correction and anatomic localization. COMPARISON:  Head CT 12/16/2022 and 04/09/2022 FINDINGS: Mediastinal blood pool activity: SUV max 2.1 NECK: No hypermetabolic cervical lymph nodes are identified.Fairly symmetric activity within  the lymphoid tissue of Waldeyer's ring is within physiologic limits. No suspicious activity identified within the pharyngeal mucosal space. Incidental CT findings: none CHEST: No significant change in multiple hypermetabolic AP window and bilateral hilar lymph nodes. A right hilar node has an SUV max of 10.3 (previously 9.2). No new hypermetabolic thoracic lymph nodes. Unchanged low-level activity associated with chronic right upper lobe scarring. No new hypermetabolic pulmonary activity or suspicious nodularity. Incidental CT findings: Postsurgical changes in the right upper lobe. Multiple calcified right upper lobe granulomas. There are calcified mediastinal and right hilar lymph nodes. Right IJ Port-A-Cath extends into the right atrium. Stable mild aortic and great vessel atherosclerosis. ABDOMEN/PELVIS: There is no hypermetabolic activity within the liver, adrenal glands, spleen or pancreas. There is no hypermetabolic nodal activity in the abdomen or pelvis. Incidental CT findings: Unchanged hepatic cysts. Stable postsurgical changes from previous cystoprostatectomy with urinary diversion in right lower quadrant. No significant ureteral dilatation. Mild aortic and branch vessel atherosclerosis. SKELETON: There is no hypermetabolic activity to suggest osseous metastatic disease. Incidental CT findings: Unchanged lumbar spondylosis. IMPRESSION: 1. Stable PET-CT without evidence of local recurrence or metastatic disease. 2. Stable hypermetabolic mediastinal and bilateral hilar lymph nodes, likely related to prior granulomatous disease. 3. Stable postsurgical changes from previous cystoprostatectomy with urinary diversion in the right lower quadrant. 4.  Aortic Atherosclerosis (ICD10-I70.0). Electronically Signed   By: Carey Bullocks M.D.   On: 06/29/2023 09:19    ASSESSMENT & PLAN Robert Lowery 67 y.o. male with medical history significant for stage IV high grade urothelial carcinoma of the bladder who presents  for a follow up visit.  After review of the labs, review the records, discussion with the patient the findings are most consistent with a stage IV high-grade urothelial carcinoma the bladder, now in remission.  The patient underwent extensive treatment including chemotherapy, surgery, and radiation.  He is disease-free based off his last PET CT scan.  At this time would recommend continued surveillance and monitoring with his next checkup in January 2025.  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 -- Labs show creatinine of 0.95, white blood cell 4.2, hemoglobin 13.2, MCV 91, and platelets of 194.  LFTs within normal limits. -- Plan for repeat PET CT scan in 6 months time with clinic visit in January 2025.  Orders Placed This Encounter  Procedures   NM PET Image Restag (PS) Skull Base To Thigh    Standing Status:   Future    Standing Expiration Date:   12/20/2023    Order Specific Question:   If indicated for the ordered procedure, I authorize the administration of a radiopharmaceutical per Radiology protocol    Answer:   Yes    Order Specific Question:   Preferred imaging location?    Answer:   [facility]    All questions were answered. The patient knows to call the clinic with any problems, questions or concerns.  A total of more than 40 minutes were spent on this encounter with face-to-face time and non-face-to-face time, including preparing to see the patient, ordering tests and/or medications, counseling the patient and coordination of care as outlined above.   Ulysees Barns, MD Department of Hematology/Oncology [facility] Phone: [phone] Pager: [phone] Email: [email or web address]  06/29/2023 4:00 PM

## 2023-08-23 ENCOUNTER — Other Ambulatory Visit: Payer: Self-pay

## 2023-08-23 ENCOUNTER — Inpatient Hospital Stay: Payer: Medicare Other | Attending: Oncology

## 2023-08-23 DIAGNOSIS — Z95828 Presence of other vascular implants and grafts: Secondary | ICD-10-CM

## 2023-08-23 DIAGNOSIS — Z8551 Personal history of malignant neoplasm of bladder: Secondary | ICD-10-CM | POA: Insufficient documentation

## 2023-08-23 MED ORDER — HEPARIN SOD (PORK) LOCK FLUSH 100 UNIT/ML IV SOLN
500.0000 [IU] | Freq: Once | INTRAVENOUS | Status: AC
  Administered 2023-08-23: 500 [IU]

## 2023-08-23 MED ORDER — SODIUM CHLORIDE 0.9% FLUSH
10.0000 mL | Freq: Once | INTRAVENOUS | Status: AC
  Administered 2023-08-23: 10 mL

## 2023-09-16 NOTE — Progress Notes (Unsigned)
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  He subsequently developed a pulmonary nodule.  05/16/2019: lung biopsy completed  06/02/2019: Gemcitabine and cisplatin chemotherapy cycle 1 started  09/21/2019: completed 6 cycles of therapy, had a near complete response with a residual pulmonary nodule.  10/30/2019: wedge resection of right upper lobe residual tumor using a right thoracotomy, pathology confirmed the presence of residual urothelial carcinoma.  02/2020: radiation therapy to the right upper lung, 50 Gray in 5 fractions  12/18/2022: last visit with Dr. Clelia Croft     History of Present Illness  09/17/2023  Pulmonary/ 1st office eval/Jamarco Zaldivar  Chief Complaint  Patient presents with   Consult    Dry cough with random clear mucus production x 6 months.  COVID in April 2024.  Dyspnea:  none Cough: was dry p covid but c/o mucus production x 2 weeks = sporadic clear daytime urge to clear throat but minimal production  thin mucus prodcution  Sleep: bed is flat / one pillow no cough or resp cc noct  SABA use: none  No obvious other  day to day or daytime pattern/variability or assoc  purulent sputum or mucus plugs or hemoptysis or cp or chest tightness, subjective wheeze or overt sinus or hb symptoms.    Also denies any obvious fluctuation of symptoms with weather or environmental changes or other aggravating or alleviating factors except as outlined above   No unusual exposure hx or h/o childhood pna/ asthma or knowledge of premature birth.  Current Allergies, Complete Past Medical History, Past Surgical History, Family History, and Social History were reviewed in [employer] record.  ROS   The following are not active complaints unless bolded Hoarseness, sore throat (globus)  dysphagia, dental problems, itching, sneezing,  nasal congestion or discharge of excess mucus or purulent secretions, ear ache,   fever, chills, sweats, unintended wt loss or wt gain, classically pleuritic or exertional cp,  orthopnea pnd or arm/hand swelling  or leg swelling, presyncope, palpitations, abdominal pain, anorexia, nausea, vomiting, diarrhea  or change in bowel habits or change in bladder habits, change in stools or change in urine, dysuria, hematuria,  rash, arthralgias, visual complaints, headache, numbness, weakness or ataxia or problems with walking or coordination,  change in mood or  memory.         Outpatient Medications Prior to Visit  Medication Sig Dispense Refill   Calcium 200 MG TABS Take by mouth.     cholecalciferol (VITAMIN D3) 25 MCG (1000 UT) tablet Take 1,000 Units by mouth daily.     clotrimazole-betamethasone (LOTRISONE) cream Apply 1 Application topically 2 (two) times daily.     Menaquinone-7 (K2 PO) Take by mouth.     No facility-administered medications prior to visit.    Past Medical History:  Diagnosis Date   Bladder cancer (HCC) 05/10/2018   TUR of the BLADDER...DR. Annabell Howells   Chest pain    at rest   Hearing loss    right ear   Hepatitis    history of Hep. B   History of hiatal hernia 05/30/2016   Small retrocardiac, noted on CT   Internal hemorrhoids  Liver lesion    Lung cancer (HCC)    Mild cardiomegaly    Pre-diabetes    Scrotal nodule    Bilateral      Objective:     BP 122/62 (BP Location: Left Arm, Patient Position: Sitting, Cuff Size: Normal)   Pulse (!) 53   Temp 98 F (36.7 C) (Oral)   Ht 5\' 6"  (1.676 m)   Wt 126 lb 9.6 oz (57.4 kg)   SpO2 98%   BMI 20.43 kg/m   SpO2: 98 % RA  Amb asian male with classic mild pseudowheeze   HEENT : Oropharynx  clear      Nasal turbinates nl    NECK :  without  apparent JVD/ palpable Nodes/TM     LUNGS: no acc muscle use,  Nl contour chest which is clear to A and P bilaterally without cough on insp or exp maneuvers   CV:  RRR  no s3 or murmur or increase in P2, and no edema   ABD:  soft and nontender with nl inspiratory excursion in the supine position. No bruits or organomegaly appreciated   MS:  Nl gait/ ext warm without deformities Or obvious joint restrictions  calf tenderness, cyanosis or clubbing    SKIN: warm and dry without lesions    NEURO:  alert, approp, nl sensorium with  no motor or cerebellar deficits apparent.   CXR PA and Lateral:   09/17/2023 :    I personally reviewed images and impression is as follows:     Scarring in RUL / no acute findings     Assessment   Upper airway cough syndrome Onset with covid April 2024 but turned productive x mid September 2024  - PET CT 06/25/23 neg for parenchymal lung dz  - cyclical cough rx with otc acid suppression and tessalon tid 09/17/2023 >>>   Upper airway cough syndrome (previously labeled PNDS),  is so named because it's frequently impossible to sort out how much is  CR/sinusitis with freq throat clearing (which can be related to primary GERD)   vs  causing  secondary (" extra esophageal")  GERD from wide swings in gastric pressure that occur with throat clearing, often  promoting self use of mint and menthol lozenges that reduce the lower esophageal sphincter tone and exacerbate the problem further in a cyclical fashion.   These are the same pts (now being labeled as having "irritable larynx syndrome" by some cough centers) who not infrequently have a history of having failed to tolerate ace inhibitors,  dry powder inhalers or biphosphonates or report having atypical/extraesophageal reflux symptoms that don't respond to standard doses of PPI  and are easily confused as having aecopd or asthma flares by even experienced allergists/ pulmonologists (myself included).   Of the three most common causes of  Sub-acute /  recurrent or chronic cough, only one (GERD)  can actually contribute to/ trigger  the other two (asthma and post nasal drip syndrome)  and perpetuate the cylce of cough.  While not intuitively obvious, many patients with chronic low grade reflux do not cough until there is a primary insult that disturbs the protective epithelial barrier and exposes sensitive nerve endings.   This is typically viral but can due to PNDS and  either may have applied  here.     >>>  The point is that once this occurs, it is difficult to eliminate the cycle  using anything but a maximally effective acid suppression regimen at least in  the short run, accompanied by an appropriate diet to address non acid GERD and control / eliminate the cough itself for at least 3 days tessalon 200 and hard rock candy to avoid throat clearing with f/u by ENT next if globus sensation persists   Discussed in detail all the  indications, usual  risks and alternatives  relative to the benefits with patient thru interpreter who agrees to proceed with Rx as outlined.           Each maintenance medication was reviewed in detail including emphasizing most importantly the difference between maintenance and prns and under what circumstances the prns are to be triggered using an action plan format where appropriate.  Total time for H and P, chart review, counseling,   and generating customized AVS unique to this office visit / same day charting  > 30 min for multiple  refractory respiratory  symptoms of uncertain etiology with pt new to me              Sandrea Hughs, MD 09/17/2023

## 2023-09-17 ENCOUNTER — Ambulatory Visit: Payer: Medicare Other | Admitting: Internal Medicine

## 2023-09-17 ENCOUNTER — Ambulatory Visit: Payer: Medicare Other

## 2023-09-17 ENCOUNTER — Encounter: Payer: Self-pay | Admitting: Internal Medicine

## 2023-09-17 VITALS — BP 122/62 | HR 53 | Temp 98.0°F | Ht 66.0 in | Wt 126.6 lb

## 2023-09-17 DIAGNOSIS — R058 Other specified cough: Secondary | ICD-10-CM | POA: Diagnosis not present

## 2023-09-17 MED ORDER — BENZONATATE 200 MG PO CAPS: 200.0000 mg | ORAL_CAPSULE | Freq: Three times a day (TID) | ORAL | 1 refills | Status: AC | PRN

## 2023-09-17 NOTE — Patient Instructions (Addendum)
GERD (REFLUX)  is an extremely common cause of respiratory symptoms just like yours , many times with no obvious heartburn at all.    It can be treated with medication, but also with lifestyle changes including elevation of the head of your bed (ideally with 6 -8inch blocks under the headboard of your bed),  Smoking cessation, avoidance of late meals, excessive alcohol, and avoid fatty foods, chocolate, peppermint, colas, red wine, and acidic juices such as orange juice.  NO MINT OR MENTHOL PRODUCTS SO NO COUGH DROPS  (ludens ok )  USE SUGARLESS CANDY INSTEAD (Jolley ranchers or [employer] or Environmental manager) or even ice chips will also do - the key is to swallow to prevent all throat clearing. NO OIL BASED VITAMINS - use powdered substitutes.  Avoid fish oil when coughing.    Try prilosec otc 20mg   Take 30-60 min before first meal of the day and Pepcid ac (famotidine) 20 mg one @  bedtime until cough is completely gone for at least a week    For cough/ throat congestion > tessalon 200 mg three times a day until gone for at least three days  Please remember to go to the  x-ray department  for your tests - we will call you with the results when they are available    If not better by the time you finish the tessalon, call for ENT referral.

## 2023-09-17 NOTE — Assessment & Plan Note (Signed)
Onset with covid April 2024 but turned productive x mid September 2024  - PET CT 06/25/23 neg for parenchymal lung dz  - cyclical cough rx with otc acid suppression and tessalon tid 09/17/2023 >>>   Upper airway cough syndrome (previously labeled PNDS),  is so named because it's frequently impossible to sort out how much is  CR/sinusitis with freq throat clearing (which can be related to primary GERD)   vs  causing  secondary (" extra esophageal")  GERD from wide swings in gastric pressure that occur with throat clearing, often  promoting self use of mint and menthol lozenges that reduce the lower esophageal sphincter tone and exacerbate the problem further in a cyclical fashion.   These are the same pts (now being labeled as having "irritable larynx syndrome" by some cough centers) who not infrequently have a history of having failed to tolerate ace inhibitors,  dry powder inhalers or biphosphonates or report having atypical/extraesophageal reflux symptoms that don't respond to standard doses of PPI  and are easily confused as having aecopd or asthma flares by even experienced allergists/ pulmonologists (myself included).   Of the three most common causes of  Sub-acute / recurrent or chronic cough, only one (GERD)  can actually contribute to/ trigger  the other two (asthma and post nasal drip syndrome)  and perpetuate the cylce of cough.  While not intuitively obvious, many patients with chronic low grade reflux do not cough until there is a primary insult that disturbs the protective epithelial barrier and exposes sensitive nerve endings.   This is typically viral but can due to PNDS and  either may have applied  here.     >>>  The point is that once this occurs, it is difficult to eliminate the cycle  using anything but a maximally effective acid suppression regimen at least in the short run, accompanied by an appropriate diet to address non acid GERD and control / eliminate the cough itself for at  least 3 days tessalon 200 and hard rock candy to avoid throat clearing with f/u by ENT next if globus sensation persists   Discussed in detail all the  indications, usual  risks and alternatives  relative to the benefits with patient thru interpreter who agrees to proceed with Rx as outlined.           Each maintenance medication was reviewed in detail including emphasizing most importantly the difference between maintenance and prns and under what circumstances the prns are to be triggered using an action plan format where appropriate.  Total time for H and P, chart review, counseling,   and generating customized AVS unique to this office visit / same day charting  > 30 min for multiple  refractory respiratory  symptoms of uncertain etiology with pt new to me

## 2023-10-25 ENCOUNTER — Inpatient Hospital Stay: Payer: Medicare Other | Attending: Oncology

## 2023-10-25 DIAGNOSIS — Z8551 Personal history of malignant neoplasm of bladder: Secondary | ICD-10-CM | POA: Diagnosis present

## 2023-10-25 DIAGNOSIS — Z95828 Presence of other vascular implants and grafts: Secondary | ICD-10-CM

## 2023-10-25 MED ORDER — SODIUM CHLORIDE 0.9% FLUSH
10.0000 mL | Freq: Once | INTRAVENOUS | Status: AC
  Administered 2023-10-25: 10 mL

## 2023-10-25 MED ORDER — HEPARIN SOD (PORK) LOCK FLUSH 100 UNIT/ML IV SOLN
500.0000 [IU] | Freq: Once | INTRAVENOUS | Status: AC
  Administered 2023-10-25: 500 [IU]

## 2023-11-22 ENCOUNTER — Other Ambulatory Visit: Payer: Self-pay | Admitting: *Deleted

## 2023-11-22 ENCOUNTER — Telehealth: Payer: Self-pay | Admitting: *Deleted

## 2023-11-22 NOTE — Telephone Encounter (Signed)
Received call from pt. He states she will need a PET scan prior to his next appt with Dr. Leonides Schanz on 12/30/23  There is no order for this.  Dr. Leonides Schanz made aware of this.

## 2023-11-30 ENCOUNTER — Other Ambulatory Visit: Payer: Self-pay | Admitting: Hematology and Oncology

## 2023-11-30 DIAGNOSIS — C670 Malignant neoplasm of trigone of bladder: Secondary | ICD-10-CM

## 2023-12-17 ENCOUNTER — Encounter (HOSPITAL_COMMUNITY)
Admission: RE | Admit: 2023-12-17 | Discharge: 2023-12-17 | Disposition: A | Discharge status: Home / Self Care | Payer: Medicare Other | Source: Ambulatory Visit | Attending: Hematology and Oncology | Admitting: Hematology and Oncology

## 2023-12-17 DIAGNOSIS — C670 Malignant neoplasm of trigone of bladder: Secondary | ICD-10-CM | POA: Diagnosis present

## 2023-12-17 LAB — GLUCOSE, CAPILLARY: Glucose-Capillary: 90 mg/dL (ref 70–99)

## 2023-12-17 MED ORDER — FLUDEOXYGLUCOSE F - 18 (FDG) INJECTION
6.3000 | Freq: Once | INTRAVENOUS | Status: AC
  Administered 2023-12-17: 5.96 via INTRAVENOUS

## 2023-12-30 ENCOUNTER — Inpatient Hospital Stay: Payer: Medicare Other

## 2023-12-30 ENCOUNTER — Other Ambulatory Visit: Payer: Medicare Other

## 2023-12-30 ENCOUNTER — Other Ambulatory Visit: Payer: Self-pay | Admitting: Hematology and Oncology

## 2023-12-30 ENCOUNTER — Inpatient Hospital Stay: Payer: Medicare Other | Attending: Oncology | Admitting: Hematology and Oncology

## 2023-12-30 VITALS — BP 128/65 | HR 47 | Temp 97.5°F | Resp 16 | Ht 66.0 in | Wt 126.6 lb

## 2023-12-30 DIAGNOSIS — Z95828 Presence of other vascular implants and grafts: Secondary | ICD-10-CM | POA: Diagnosis not present

## 2023-12-30 DIAGNOSIS — C670 Malignant neoplasm of trigone of bladder: Secondary | ICD-10-CM

## 2023-12-30 DIAGNOSIS — Z87891 Personal history of nicotine dependence: Secondary | ICD-10-CM | POA: Insufficient documentation

## 2023-12-30 DIAGNOSIS — Z9221 Personal history of antineoplastic chemotherapy: Secondary | ICD-10-CM | POA: Diagnosis not present

## 2023-12-30 DIAGNOSIS — Z923 Personal history of irradiation: Secondary | ICD-10-CM | POA: Diagnosis not present

## 2023-12-30 DIAGNOSIS — Z8551 Personal history of malignant neoplasm of bladder: Secondary | ICD-10-CM | POA: Insufficient documentation

## 2023-12-30 LAB — CBC WITH DIFFERENTIAL (CANCER CENTER ONLY)
Abs Immature Granulocytes: 0.02 10*3/uL (ref 0.00–0.07)
Basophils Absolute: 0 10*3/uL (ref 0.0–0.1)
Basophils Relative: 1 %
Eosinophils Absolute: 0.1 10*3/uL (ref 0.0–0.5)
Eosinophils Relative: 1 %
HCT: 37.4 % — ABNORMAL LOW (ref 39.0–52.0)
Hemoglobin: 12.5 g/dL — ABNORMAL LOW (ref 13.0–17.0)
Immature Granulocytes: 1 %
Lymphocytes Relative: 45 %
Lymphs Abs: 1.9 10*3/uL (ref 0.7–4.0)
MCH: 30.1 pg (ref 26.0–34.0)
MCHC: 33.4 g/dL (ref 30.0–36.0)
MCV: 90.1 fL (ref 80.0–100.0)
Monocytes Absolute: 0.3 10*3/uL (ref 0.1–1.0)
Monocytes Relative: 7 %
Neutro Abs: 2 10*3/uL (ref 1.7–7.7)
Neutrophils Relative %: 45 %
Platelet Count: 230 10*3/uL (ref 150–400)
RBC: 4.15 MIL/uL — ABNORMAL LOW (ref 4.22–5.81)
RDW: 12.4 % (ref 11.5–15.5)
WBC Count: 4.3 10*3/uL (ref 4.0–10.5)
nRBC: 0 % (ref 0.0–0.2)

## 2023-12-30 LAB — CMP (CANCER CENTER ONLY)
ALT: 29 U/L (ref 0–44)
AST: 22 U/L (ref 15–41)
Albumin: 3.8 g/dL (ref 3.5–5.0)
Alkaline Phosphatase: 46 U/L (ref 38–126)
Anion gap: 4 — ABNORMAL LOW (ref 5–15)
BUN: 28 mg/dL — ABNORMAL HIGH (ref 8–23)
CO2: 30 mmol/L (ref 22–32)
Calcium: 8.8 mg/dL — ABNORMAL LOW (ref 8.9–10.3)
Chloride: 107 mmol/L (ref 98–111)
Creatinine: 0.88 mg/dL (ref 0.61–1.24)
GFR, Estimated: >60 mL/min (ref >60)
Glucose, Bld: 98 mg/dL (ref 70–99)
Potassium: 4 mmol/L (ref 3.5–5.1)
Sodium: 141 mmol/L (ref 135–145)
Total Bilirubin: 0.6 mg/dL (ref 0.0–1.2)
Total Protein: 6.4 g/dL — ABNORMAL LOW (ref 6.5–8.1)

## 2023-12-30 LAB — LACTATE DEHYDROGENASE: LDH: 139 U/L (ref 98–192)

## 2023-12-30 MED ORDER — SODIUM CHLORIDE 0.9% FLUSH
10.0000 mL | Freq: Once | INTRAVENOUS | Status: AC
  Administered 2023-12-30: 10 mL

## 2023-12-30 MED ORDER — HEPARIN SOD (PORK) LOCK FLUSH 100 UNIT/ML IV SOLN
500.0000 [IU] | Freq: Once | INTRAVENOUS | Status: AC
  Administered 2023-12-30: 500 [IU]

## 2023-12-30 NOTE — Progress Notes (Signed)
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  He subsequently developed a pulmonary nodule.  05/16/2019: lung biopsy completed  06/02/2019: Gemcitabine and cisplatin chemotherapy cycle 1 started  09/21/2019: completed 6 cycles of therapy, had a near complete response with a residual pulmonary nodule.  10/30/2019: wedge resection of right upper lobe residual tumor using a right thoracotomy, pathology confirmed the presence of residual urothelial carcinoma.  02/2020: radiation therapy to the resected right upper lung, 50 Gray in 5 fractions  12/18/2022: last visit with Dr. Clelia Croft  Interval History:  Robert Lowery 68 y.o. male with medical history significant for stage IV high grade urothelial carcinoma of the bladder who presents for a follow up visit. The patient's last visit was on 06/29/2023. In the interim since the last visit he had a PET CT scan on 12/17/2023 which showed no evidence of residual or recurrent disease.  On exam today our conversation is assisted with a hospital approved interpreter.  Robert Lowery reports he has been well overall in the interim since her last visit.  He is had no hospitalizations, ER visits, or other changes in his health.  He reports his energy levels are good and his appetite is strong.  He reports that he is not having any urinary issues such as foul-smelling urine, changes in the color of the urine, or increased frequency.  He reports that otherwise he has been his baseline level of health and has no questions concerns or complaints today.Marland Kitchen  He denies any fevers, chills, sweats, nausea, vomiting or diarrhea.  A full 10 point ROS is otherwise negative.  MEDICAL HISTORY:  Past Medical History:  Diagnosis Date   Bladder  cancer (HCC) 05/10/2018   TUR of the BLADDER...DR. Annabell Howells   Chest pain    at rest   Hearing loss    right ear   Hepatitis    history of Hep. B   History of hiatal hernia 05/30/2016   Small retrocardiac, noted on CT   Internal hemorrhoids    Liver lesion    Lung cancer (HCC)    Mild cardiomegaly    Pre-diabetes    Scrotal nodule    Bilateral    SURGICAL HISTORY: Past Surgical History:  Procedure Laterality Date   BIOPSY TESTIS     bilateral testicle mass   COLONOSCOPY     CYSTOSCOPY/RETROGRADE/URETEROSCOPY N/A 05/10/2018   Procedure: BILATERAL RETROGRADE;  Surgeon: Bjorn Pippin, MD;  Location: [facility];  Service: Urology;  Laterality: N/A;   IR IMAGING GUIDED PORT INSERTION  05/31/2019   THORACOTOMY Right 10/30/2019   Procedure: THORACOTOMY MAJOR;  Surgeon: Alleen Borne, MD;  Location: [facility];  Service: Thoracic;  Laterality: Right;   TRANSURETHRAL RESECTION OF BLADDER TUMOR WITH MITOMYCIN-C N/A 05/10/2018   Procedure: CYSTOSCOPY TRANSURETHRAL RESECTION OF BLADDER TUMOR WITH MITOMYCIN-C;  Surgeon: Bjorn Pippin, MD;  Location: [facility];  Service: Urology;  Laterality: N/A;   UPPER GI ENDOSCOPY     WEDGE RESECTION Right 10/30/2019   Procedure: WEDGE RESECTION;  Surgeon: Alleen Borne, MD;  Location: [facility];  Service: Thoracic;  Laterality: Right;    SOCIAL HISTORY: Social History   Socioeconomic History   Marital status: Married    Spouse name: Not on file   Number of children: Not  on file   Years of education: Not on file   Highest education level: Not on file  Occupational History   Not on file  Tobacco Use   Smoking status: Former    Current packs/day: 0.00    Types: Cigarettes    Quit date: 47    Years since quitting: 28.0   Smokeless tobacco: Never   Tobacco comments:    Quit smoking in 1997  Vaping Use   Vaping status: Never Used  Substance and Sexual Activity   Alcohol use: Never   Drug use: Never   Sexual activity: Not on file  Other Topics Concern    Not on file  Social History Narrative   Not on file   Social Drivers of Health   Financial Resource Strain: Not on file  Food Insecurity: Not on file  Transportation Needs: Not on file  Physical Activity: Not on file  Stress: Not on file  Social Connections: Not on file  Intimate Partner Violence: Not on file    FAMILY HISTORY: No family history on file.  ALLERGIES:  has no known allergies.  MEDICATIONS:  Current Outpatient Medications  Medication Sig Dispense Refill   benzonatate (TESSALON) 200 MG capsule Take 1 capsule (200 mg total) by mouth 3 (three) times daily as needed for cough. 30 capsule 1   Calcium 200 MG TABS Take by mouth.     cholecalciferol (VITAMIN D3) 25 MCG (1000 UT) tablet Take 1,000 Units by mouth daily.     clotrimazole-betamethasone (LOTRISONE) cream Apply 1 Application topically 2 (two) times daily.     Menaquinone-7 (K2 PO) Take by mouth.     No current facility-administered medications for this visit.    REVIEW OF SYSTEMS:   Constitutional: ( - ) fevers, ( - )  chills , ( - ) night sweats Eyes: ( - ) blurriness of vision, ( - ) double vision, ( - ) watery eyes Ears, nose, mouth, throat, and face: ( - ) mucositis, ( - ) sore throat Respiratory: ( - ) cough, ( - ) dyspnea, ( - ) wheezes Cardiovascular: ( - ) palpitation, ( - ) chest discomfort, ( - ) lower extremity swelling Gastrointestinal:  ( - ) nausea, ( - ) heartburn, ( - ) change in bowel habits Skin: ( - ) abnormal skin rashes Lymphatics: ( - ) new lymphadenopathy, ( - ) easy bruising Neurological: ( - ) numbness, ( - ) tingling, ( - ) new weaknesses Behavioral/Psych: ( - ) mood change, ( - ) new changes  All other systems were reviewed with the patient and are negative.  PHYSICAL EXAMINATION: ECOG PERFORMANCE STATUS: 0 - Asymptomatic  Vitals:   12/30/23 1024  BP: 128/65  Pulse: (!) 47  Resp: 16  Temp: (!) 97.5 F (36.4 C)  SpO2: 100%    Filed Weights   12/30/23 1024  Weight:  126 lb 9.6 oz (57.4 kg)     GENERAL: well appearing elderly [place] male alert, no distress and comfortable SKIN: skin color, texture, turgor are normal, no rashes or significant lesions EYES: conjunctiva are pink and non-injected, sclera clear LUNGS: clear to auscultation and percussion with normal breathing effort HEART: regular rate & rhythm and no murmurs and no lower extremity edema Musculoskeletal: no cyanosis of digits and no clubbing  PSYCH: alert & oriented x 3, fluent speech NEURO: no focal motor/sensory deficits  LABORATORY DATA:  I have reviewed the data as listed    Latest Ref Rng & Units  12/30/2023   10:07 AM 06/22/2023    9:02 AM 12/18/2022    9:24 AM  CBC  WBC 4.0 - 10.5 K/uL 4.3  4.2  3.8   Hemoglobin 13.0 - 17.0 g/dL 57.8  46.9  62.9   Hematocrit 39.0 - 52.0 % 37.4  39.6  43.5   Platelets 150 - 400 K/uL 230  194  210        Latest Ref Rng & Units 12/30/2023   10:07 AM 06/22/2023    9:02 AM 12/18/2022    9:24 AM  CMP  Glucose 70 - 99 mg/dL 98  84  87   BUN 8 - 23 mg/dL 28  18  22    Creatinine 0.61 - 1.24 mg/dL 5.28  4.13  2.44   Sodium 135 - 145 mmol/L 141  140  141   Potassium 3.5 - 5.1 mmol/L 4.0  4.0  4.2   Chloride 98 - 111 mmol/L 107  109  106   CO2 22 - 32 mmol/L 30  27  30    Calcium 8.9 - 10.3 mg/dL 8.8  8.8  9.3   Total Protein 6.5 - 8.1 g/dL 6.4  6.5  6.4   Total Bilirubin 0.0 - 1.2 mg/dL 0.6  0.7  0.5   Alkaline Phos 38 - 126 U/L 46  41  48   AST 15 - 41 U/L 22  16  18    ALT 0 - 44 U/L 29  14  17      RADIOGRAPHIC STUDIES: NM PET Image Restag (PS) Skull Base To Thigh Result Date: 12/26/2023 CLINICAL DATA:  Subsequent treatment strategy for invasive bladder cancer. EXAM: NUCLEAR MEDICINE PET SKULL BASE TO THIGH TECHNIQUE: 6.0 mCi F-18 FDG was injected intravenously. Full-ring PET imaging was performed from the skull base to thigh after the radiotracer. CT data was obtained and used for attenuation correction and anatomic localization. Fasting blood  glucose: 90 mg/dl COMPARISON:  12/16/7251 FINDINGS: Mediastinal blood pool activity: SUV max 2.0 Liver activity: SUV max NA NECK: No significant abnormal hypermetabolic activity in this region. Incidental CT findings: None. CHEST: Similar calcified mediastinal and hilar lymph nodes with some of these lymph nodes demonstrating hypermetabolic activity. Small index right hilar lymph node has a maximum SUV of 8.0, previously 10.3. Subjectively similar appearance to prior. Incidental CT findings: Stable scarring and likely postoperative findings in the right upper lobe along with old granulomatous disease in the right lung. Thoracic aortic and branch vessel atheromatous vascular disease. ABDOMEN/PELVIS: No hypermetabolic adenopathy observed. Excreted radiopharmaceutical in a right pelvic neobladder, status post cystoprostatectomy. Incidental CT findings: Hepatic cysts. Atherosclerosis is present, including aortoiliac atherosclerotic disease. SKELETON: No significant abnormal hypermetabolic activity in this region. Incidental CT findings: Lumbar spondylosis and degenerative disc disease. IMPRESSION: 1. No findings of active malignancy. 2. Similar calcified mediastinal and hilar lymph nodes with several mediastinal and hilar lymph nodes demonstrating hypermetabolic activity. Subjectively similar appearance to prior, likely a manifestation of chronic granulomatous disease. 3. Stable scarring and likely postoperative findings in the right upper lobe along with old granulomatous disease in the right lung. 4. Lumbar spondylosis and degenerative disc disease. 5. Aortic atherosclerosis. Aortic Atherosclerosis (ICD10-I70.0). Electronically Signed   By: Gaylyn Rong M.D.   On: 12/26/2023 21:18    ASSESSMENT & PLAN Robert Lowery 68 y.o. male with medical history significant for stage IV high grade urothelial carcinoma of the bladder who presents for a follow up visit.  After review of the labs, review the records,  discussion with the patient the findings are most consistent with a stage IV high-grade urothelial carcinoma the bladder, now in remission.  The patient underwent extensive treatment including chemotherapy, surgery, and radiation.  He is disease-free based off his last PET CT scan.  At this time would recommend continued surveillance and monitoring with his next checkup in January 2025.  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 -- Labs show white blood cell 4.3, hemoglobin 12.5, MCV 90.1, platelets 230.  Cr and LFTs within normal limits. -- Plan for repeat PET CT scan in 6 months time with clinic visit in July 2025.  After that visit and see T scan would recommend yearly clinic visits with yearly renal ultrasounds 1 week before (per NCCN guidelines)   Orders Placed This Encounter  Procedures   IR Removal Tun Access W/ Port W/O FL    Standing Status:   Future    Expected Date:   01/06/2024    Expiration Date:   12/29/2024    Reason for exam::   Patient with history of bladder cancer, currently off treatment.  Requesting port removal    Preferred Imaging Location?:   [facility]   NM PET Image Restag (PS) Skull Base To Thigh    Standing Status:   Future    Expected Date:   06/19/2024    Expiration Date:   12/29/2024    If indicated for the ordered procedure, I authorize the administration of a radiopharmaceutical per Radiology protocol:   Yes    Preferred imaging location?:   [facility]    All questions were answered. The patient knows to call the clinic with any problems, questions or concerns.  A total of more than 30 minutes were spent on this encounter with face-to-face time and non-face-to-face time, including preparing to see the patient, ordering tests and/or medications, counseling the patient and coordination of care as outlined above.    Ulysees Barns, MD Department of Hematology/Oncology [facility] at [facility] Phone: [phone] Pager: [phone] Email: [email or web address]  12/30/2023 4:14 PM

## 2024-01-13 ENCOUNTER — Ambulatory Visit (HOSPITAL_COMMUNITY)
Admission: RE | Admit: 2024-01-13 | Discharge: 2024-01-13 | Disposition: A | Discharge status: Home / Self Care | Payer: Medicare Other | Source: Ambulatory Visit | Attending: Hematology and Oncology | Admitting: Hematology and Oncology

## 2024-01-13 DIAGNOSIS — Z452 Encounter for adjustment and management of vascular access device: Secondary | ICD-10-CM | POA: Diagnosis present

## 2024-01-13 DIAGNOSIS — Z8551 Personal history of malignant neoplasm of bladder: Secondary | ICD-10-CM | POA: Insufficient documentation

## 2024-01-13 DIAGNOSIS — Z95828 Presence of other vascular implants and grafts: Secondary | ICD-10-CM

## 2024-01-13 HISTORY — PX: IR REMOVAL TUN ACCESS W/ PORT W/O FL MOD SED: IMG2290

## 2024-01-13 MED ORDER — LIDOCAINE-EPINEPHRINE 1 %-1:100000 IJ SOLN
20.0000 mL | Freq: Once | INTRAMUSCULAR | Status: AC
  Administered 2024-01-13: 10 mL via INTRADERMAL

## 2024-01-13 MED ORDER — LIDOCAINE-EPINEPHRINE 1 %-1:100000 IJ SOLN
INTRAMUSCULAR | Status: AC
  Filled 2024-01-13: qty 1

## 2024-01-14 ENCOUNTER — Other Ambulatory Visit (HOSPITAL_COMMUNITY): Payer: Medicare Other

## 2024-02-15 ENCOUNTER — Emergency Department (HOSPITAL_COMMUNITY)
Admission: EM | Admit: 2024-02-15 | Discharge: 2024-02-16 | Disposition: A | Discharge status: Home / Self Care | Attending: Emergency Medicine | Admitting: Emergency Medicine

## 2024-02-15 ENCOUNTER — Other Ambulatory Visit: Payer: Self-pay

## 2024-02-15 DIAGNOSIS — R338 Other retention of urine: Secondary | ICD-10-CM

## 2024-02-15 DIAGNOSIS — N3001 Acute cystitis with hematuria: Secondary | ICD-10-CM | POA: Diagnosis not present

## 2024-02-15 DIAGNOSIS — R339 Retention of urine, unspecified: Secondary | ICD-10-CM | POA: Diagnosis present

## 2024-02-15 DIAGNOSIS — R32 Unspecified urinary incontinence: Secondary | ICD-10-CM | POA: Insufficient documentation

## 2024-02-15 DIAGNOSIS — Z8551 Personal history of malignant neoplasm of bladder: Secondary | ICD-10-CM | POA: Diagnosis not present

## 2024-02-15 DIAGNOSIS — D649 Anemia, unspecified: Secondary | ICD-10-CM | POA: Diagnosis not present

## 2024-02-15 LAB — CBC WITH DIFFERENTIAL/PLATELET
Abs Immature Granulocytes: 0.03 10*3/uL (ref 0.00–0.07)
Basophils Absolute: 0 10*3/uL (ref 0.0–0.1)
Basophils Relative: 0 %
Eosinophils Absolute: 0.1 10*3/uL (ref 0.0–0.5)
Eosinophils Relative: 1 %
HCT: 38.8 % — ABNORMAL LOW (ref 39.0–52.0)
Hemoglobin: 12.9 g/dL — ABNORMAL LOW (ref 13.0–17.0)
Immature Granulocytes: 0 %
Lymphocytes Relative: 25 %
Lymphs Abs: 2 10*3/uL (ref 0.7–4.0)
MCH: 30.6 pg (ref 26.0–34.0)
MCHC: 33.2 g/dL (ref 30.0–36.0)
MCV: 91.9 fL (ref 80.0–100.0)
Monocytes Absolute: 0.5 10*3/uL (ref 0.1–1.0)
Monocytes Relative: 6 %
Neutro Abs: 5.5 10*3/uL (ref 1.7–7.7)
Neutrophils Relative %: 68 %
Platelets: 188 10*3/uL (ref 150–400)
RBC: 4.22 MIL/uL (ref 4.22–5.81)
RDW: 13.2 % (ref 11.5–15.5)
WBC: 8.1 10*3/uL (ref 4.0–10.5)
nRBC: 0 % (ref 0.0–0.2)

## 2024-02-15 LAB — URINALYSIS, W/ REFLEX TO CULTURE (INFECTION SUSPECTED)
Bacteria, UA: NONE SEEN
Bilirubin Urine: NEGATIVE
Glucose, UA: NEGATIVE mg/dL
Ketones, ur: NEGATIVE mg/dL
Leukocytes,Ua: NEGATIVE
Nitrite: NEGATIVE
Protein, ur: NEGATIVE mg/dL
Specific Gravity, Urine: 1.009 (ref 1.005–1.030)
pH: 5 (ref 5.0–8.0)

## 2024-02-15 MED ORDER — DIAZEPAM 5 MG/ML IJ SOLN
2.5000 mg | Freq: Once | INTRAMUSCULAR | Status: AC
  Administered 2024-02-15: 2.5 mg via INTRAVENOUS
  Filled 2024-02-15: qty 2

## 2024-02-15 MED ORDER — HYDROMORPHONE HCL 1 MG/ML IJ SOLN
1.0000 mg | Freq: Once | INTRAMUSCULAR | Status: AC
  Administered 2024-02-15: 1 mg via INTRAVENOUS
  Filled 2024-02-15: qty 1

## 2024-02-15 NOTE — ED Triage Notes (Signed)
 Patient report unable to urinate x 6 hours. Patient denies N/V. Patient denies fever.  Patient hx bladder cancer.

## 2024-02-15 NOTE — ED Provider Notes (Signed)
 [place] EMERGENCY DEPARTMENT AT [facility] Provider Note   CSN: [id number] Arrival date & time: 02/15/24  1940     History  Chief Complaint  Patient presents with  . Urinary Retention    Robert Lowery is a 68 y.o. male.  HPI     Pt comes in with cc of severe abd pain, urinary retention. He has hx of urothelial cancer  He is accompanied by wife. Translation service was utilized.  At 3 am patient had incontinence and he was able to void. PT woke up at 6 am, again with incontinence, but still able to void. He then started having pain and started dribbling. He went to the Urologist, they noted urinary retention, placed a straight cath, got 600 cc out, his pain resolved. However, pain returned around 7 pm.  He was told that he will need to self cath 4 times a day.  Pt was given a kit, but he was unable to use it as the kit was bent.   Typically he is able to strain and urinate. No n/v/f/c.  Home Medications Prior to Admission medications   Medication Sig Start Date End Date Taking? Authorizing Provider  benzonatate (TESSALON) 200 MG capsule Take 1 capsule (200 mg total) by mouth 3 (three) times daily as needed for cough. 09/17/23   Nyoka Cowden, MD  Calcium 200 MG TABS Take by mouth.    [provider]  cholecalciferol (VITAMIN D3) 25 MCG (1000 UT) tablet Take 1,000 Units by mouth daily.    [provider]  clotrimazole-betamethasone (LOTRISONE) cream Apply 1 Application topically 2 (two) times daily. 08/19/23   [provider]  Menaquinone-7 (K2 PO) Take by mouth.    [provider]      Allergies    Patient has no known allergies.    Review of Systems   Review of Systems  All other systems reviewed and are negative.   Physical Exam Updated Vital Signs BP 136/65 (BP Location: Right Arm)   Pulse (!) 57   Temp 98.6 F (37 C) (Oral)   Resp 20   SpO2 100%  Physical Exam Vitals and nursing note reviewed.   Constitutional:      General: He is in acute distress.     Appearance: He is well-developed.  HENT:     Head: Atraumatic.  Cardiovascular:     Rate and Rhythm: Normal rate.  Pulmonary:     Effort: Pulmonary effort is normal.  Musculoskeletal:     Cervical back: Neck supple.  Skin:    General: Skin is warm.  Neurological:     Mental Status: He is alert and oriented to person, place, and time.    ED Results / Procedures / Treatments   Labs (all labs ordered are listed, but only abnormal results are displayed) Labs Reviewed  CBC WITH DIFFERENTIAL/PLATELET - Abnormal; Notable for the following components:      Result Value   Hemoglobin 12.9 (*)    HCT 38.8 (*)    All other components within normal limits  URINALYSIS, W/ REFLEX TO CULTURE (INFECTION SUSPECTED) - Abnormal; Notable for the following components:   Color, Urine STRAW (*)    Hgb urine dipstick LARGE (*)    All other components within normal limits  URINE CULTURE  BASIC METABOLIC PANEL    EKG None  Radiology No results found.  Procedures Ultrasound ED Abd  Date/Time: 02/15/2024 11:46 PM  Performed by: Derwood Kaplan, MD Authorized by:  Derwood Kaplan, MD   Procedure details:    Indications: decreased urinary output     Scope of abdominal ultrasound: Urinary retention.   Bladder:  Visualized    Images: not archived    Bladder findings:    Free pelvic fluid: identified     Volume:  415 cc     Medications Ordered in ED Medications  HYDROmorphone (DILAUDID) injection 1 mg (1 mg Intravenous Given 02/15/24 2104)  diazepam (VALIUM) injection 2.5 mg (2.5 mg Intravenous Given 02/15/24 2228)    ED Course/ Medical Decision Making/ A&P                                 Medical Decision Making Amount and/or Complexity of Data Reviewed Labs: ordered.  Risk Prescription drug management.   This patient presents to the ED with chief complaint(s) of suprapubic pain with pertinent past medical history of  malignant neoplasm of the urinary bladder.The complaint involves an extensive differential diagnosis and also carries with it a high risk of complications and morbidity.    The differential diagnosis includes : BPH, urinary retention because of infection, bladder spasms, urethral stricture, spasmatic pain.  We ordered bladder scan.  Did reveal more than 300 cc of urine.  Nursing staff attempted to perform In-and-Out cath, but failed.  I went to reassess the patient.  He informed me that after the nurses attempted and failed at the cath, he suddenly started dribbling, he stood up and was able to void about 200 cc.  I performed a bedside ultrasound, and he is over 400 cc of urine.  We have discussed the findings and have decided to proceed with Foley catheter placement.  Thereafter, patient can follow-up with the urologist in the clinic.  However, nurses unable to place a Foley catheter.  Foley team has been consulted.  Patient's care has been signed out to incoming team.  If you are unable to get patient catheterize, then we have to entertain strictures, tumor in the differential.  Patient will need urology consultation.  Additional history obtained: Additional history obtained from spouse Records reviewed previous admission documents and urology notes  Independent labs interpretation:  The following labs were independently interpreted: UA is overall reassuring.  Urine culture sent.   Consultation: - Consulted or discussed management/test interpretation with external professional: Dr. Marlou Porch, urology.  I consulted him early in the patient's ED visit.  He recommended we try Valium if he suspects spasms.  Patient was given Valium in the ER.   Final Clinical Impression(s) / ED Diagnoses Final diagnoses:  Acute urinary retention    Rx / DC Orders ED Discharge Orders     None         Derwood Kaplan, MD 02/15/24 2346

## 2024-02-15 NOTE — ED Notes (Signed)
 Verbal order given to place foley cath.

## 2024-02-15 NOTE — ED Notes (Signed)
 I/O unsuccessful. No output. MD aware.

## 2024-02-15 NOTE — ED Notes (Signed)
 Bladder scanned. Pt. has 384 ml in bladder.

## 2024-02-15 NOTE — ED Notes (Signed)
 Attempt for foley cath unsuccessful

## 2024-02-16 ENCOUNTER — Other Ambulatory Visit: Payer: Self-pay

## 2024-02-16 ENCOUNTER — Emergency Department (HOSPITAL_COMMUNITY)
Admission: EM | Admit: 2024-02-16 | Discharge: 2024-02-16 | Disposition: A | Discharge status: Home / Self Care | Source: Home / Self Care | Attending: Emergency Medicine | Admitting: Emergency Medicine

## 2024-02-16 DIAGNOSIS — D649 Anemia, unspecified: Secondary | ICD-10-CM | POA: Insufficient documentation

## 2024-02-16 DIAGNOSIS — R32 Unspecified urinary incontinence: Secondary | ICD-10-CM | POA: Insufficient documentation

## 2024-02-16 DIAGNOSIS — R339 Retention of urine, unspecified: Secondary | ICD-10-CM | POA: Insufficient documentation

## 2024-02-16 DIAGNOSIS — Z8551 Personal history of malignant neoplasm of bladder: Secondary | ICD-10-CM | POA: Insufficient documentation

## 2024-02-16 DIAGNOSIS — N3001 Acute cystitis with hematuria: Secondary | ICD-10-CM | POA: Insufficient documentation

## 2024-02-16 LAB — COMPREHENSIVE METABOLIC PANEL
ALT: 19 U/L (ref 0–44)
AST: 20 U/L (ref 15–41)
Albumin: 3.5 g/dL (ref 3.5–5.0)
Alkaline Phosphatase: 44 U/L (ref 38–126)
Anion gap: 8 (ref 5–15)
BUN: 26 mg/dL — ABNORMAL HIGH (ref 8–23)
CO2: 24 mmol/L (ref 22–32)
Calcium: 8.7 mg/dL — ABNORMAL LOW (ref 8.9–10.3)
Chloride: 107 mmol/L (ref 98–111)
Creatinine, Ser: 0.92 mg/dL (ref 0.61–1.24)
GFR, Estimated: >60 mL/min (ref >60)
Glucose, Bld: 86 mg/dL (ref 70–99)
Potassium: 3.6 mmol/L (ref 3.5–5.1)
Sodium: 139 mmol/L (ref 135–145)
Total Bilirubin: 0.7 mg/dL (ref 0.0–1.2)
Total Protein: 6.1 g/dL — ABNORMAL LOW (ref 6.5–8.1)

## 2024-02-16 LAB — URINALYSIS, W/ REFLEX TO CULTURE (INFECTION SUSPECTED)
Bilirubin Urine: NEGATIVE
Glucose, UA: NEGATIVE mg/dL
Ketones, ur: NEGATIVE mg/dL
Nitrite: NEGATIVE
Protein, ur: NEGATIVE mg/dL
Specific Gravity, Urine: 1.01 (ref 1.005–1.030)
pH: 5 (ref 5.0–8.0)

## 2024-02-16 LAB — CBC WITH DIFFERENTIAL/PLATELET
Abs Immature Granulocytes: 0.01 10*3/uL (ref 0.00–0.07)
Basophils Absolute: 0 10*3/uL (ref 0.0–0.1)
Basophils Relative: 0 %
Eosinophils Absolute: 0.1 10*3/uL (ref 0.0–0.5)
Eosinophils Relative: 1 %
HCT: 37.3 % — ABNORMAL LOW (ref 39.0–52.0)
Hemoglobin: 12.4 g/dL — ABNORMAL LOW (ref 13.0–17.0)
Immature Granulocytes: 0 %
Lymphocytes Relative: 39 %
Lymphs Abs: 2.5 10*3/uL (ref 0.7–4.0)
MCH: 30.9 pg (ref 26.0–34.0)
MCHC: 33.2 g/dL (ref 30.0–36.0)
MCV: 93 fL (ref 80.0–100.0)
Monocytes Absolute: 0.5 10*3/uL (ref 0.1–1.0)
Monocytes Relative: 7 %
Neutro Abs: 3.5 10*3/uL (ref 1.7–7.7)
Neutrophils Relative %: 53 %
Platelets: 178 10*3/uL (ref 150–400)
RBC: 4.01 MIL/uL — ABNORMAL LOW (ref 4.22–5.81)
RDW: 13.4 % (ref 11.5–15.5)
WBC: 6.6 10*3/uL (ref 4.0–10.5)
nRBC: 0 % (ref 0.0–0.2)

## 2024-02-16 LAB — BASIC METABOLIC PANEL
Anion gap: 6 (ref 5–15)
BUN: 25 mg/dL — ABNORMAL HIGH (ref 8–23)
CO2: 23 mmol/L (ref 22–32)
Calcium: 8.2 mg/dL — ABNORMAL LOW (ref 8.9–10.3)
Chloride: 108 mmol/L (ref 98–111)
Creatinine, Ser: 0.88 mg/dL (ref 0.61–1.24)
GFR, Estimated: >60 mL/min (ref >60)
Glucose, Bld: 178 mg/dL — ABNORMAL HIGH (ref 70–99)
Potassium: 3.4 mmol/L — ABNORMAL LOW (ref 3.5–5.1)
Sodium: 137 mmol/L (ref 135–145)

## 2024-02-16 MED ORDER — SODIUM CHLORIDE 0.9 % IV SOLN
1.0000 g | Freq: Once | INTRAVENOUS | Status: AC
  Administered 2024-02-16: 1 g via INTRAVENOUS
  Filled 2024-02-16: qty 10

## 2024-02-16 MED ORDER — CEPHALEXIN 500 MG PO CAPS: 500.0000 mg | ORAL_CAPSULE | Freq: Two times a day (BID) | ORAL | 0 refills | Status: AC

## 2024-02-16 NOTE — ED Provider Notes (Signed)
 [place] EM[facility]GENCY DEPARTMENT AT [facility] Provider Note   CSN: [id number] Arrival date & time: 02/16/24  1708     History  Chief Complaint  Patient presents with   Urinary Retention    Robert Lowery is a 68 y.o. male with PMHx bladder cancer (remission), paroxysmal afib who presents to ED concerned for urinary retention. Patient was seen in ED last night where foley catheter was placed and patient was discharged with urology follow up. Patient woke up this morning and noticed that his catheter was no longer draining. Patient with increasing suprapubic discomfort because of this. Patient stating that he has "clumpy" urine at baseline d/t his hx of bladder cancer.   Denies any recent infectious symptoms.   HPI     Home Medications Prior to Admission medications   Medication Sig Start Date End Date Taking? Authorizing Provider  cephALEXin (KEFLEX) 500 MG capsule Take 1 capsule (500 mg total) by mouth 2 (two) times daily for 7 days. 02/16/24 02/23/24 Yes Marlyss Cissell, Charlotte Sanes F, PA-C  benzonatate (TESSALON) 200 MG capsule Take 1 capsule (200 mg total) by mouth 3 (three) times daily as needed for cough. 09/17/23   Nyoka Cowden, MD  Calcium 200 MG TABS Take by mouth.    [provider]  cholecalciferol (VITAMIN D3) 25 MCG (1000 UT) tablet Take 1,000 Units by mouth daily.    [provider]  clotrimazole-betamethasone (LOTRISONE) cream Apply 1 Application topically 2 (two) times daily. 08/19/23   [provider]  Menaquinone-7 (K2 PO) Take by mouth.    [provider]      Allergies    Patient has no known allergies.    Review of Systems   Review of Systems  Genitourinary:  Positive for difficulty urinating.    Physical Exam Updated Vital Signs BP (!) 140/68 (BP Location: Left Arm)   Pulse (!) 58   Temp 98 F (36.7 C) (Oral)   Resp 20   Ht 5\' 6"  (1.676 m)   Wt 57.4 kg   SpO2 100%   BMI 20.42 kg/m  Physical Exam Vitals and  nursing note reviewed.  Constitutional:      General: He is not in acute distress.    Appearance: He is not ill-appearing or toxic-appearing.  HENT:     Head: Normocephalic and atraumatic.     Mouth/Throat:     Mouth: Mucous membranes are moist.  Eyes:     General: No scleral icterus.       Right eye: No discharge.        Left eye: No discharge.     Conjunctiva/sclera: Conjunctivae normal.  Cardiovascular:     Rate and Rhythm: Normal rate and regular rhythm.     Pulses: Normal pulses.     Heart sounds: Normal heart sounds. No murmur heard. Pulmonary:     Effort: Pulmonary effort is normal. No respiratory distress.     Breath sounds: Normal breath sounds. No wheezing, rhonchi or rales.  Abdominal:     General: Abdomen is flat. Bowel sounds are normal. There is no distension.     Palpations: Abdomen is soft. There is no mass.     Tenderness: There is no abdominal tenderness.  Musculoskeletal:     Right lower leg: No edema.     Left lower leg: No edema.  Skin:    General: Skin is warm and dry.     Findings: No rash.  Neurological:     General: No  focal deficit present.     Mental Status: He is alert and oriented to person, place, and time. Mental status is at baseline.  Psychiatric:        Mood and Affect: Mood normal.     ED Results / Procedures / Treatments   Labs (all labs ordered are listed, but only abnormal results are displayed) Labs Reviewed  CBC WITH DIFFERENTIAL/PLATELET - Abnormal; Notable for the following components:      Result Value   RBC 4.01 (*)    Hemoglobin 12.4 (*)    HCT 37.3 (*)    All other components within normal limits  COMPREHENSIVE METABOLIC PANEL - Abnormal; Notable for the following components:   BUN 26 (*)    Calcium 8.7 (*)    Total Protein 6.1 (*)    All other components within normal limits  URINALYSIS, W/ REFLEX TO CULTURE (INFECTION SUSPECTED) - Abnormal; Notable for the following components:   APPearance HAZY (*)    Hgb urine  dipstick SMALL (*)    Leukocytes,Ua TRACE (*)    Bacteria, UA RARE (*)    All other components within normal limits  URINE CULTURE    EKG None  Radiology No results found.  Procedures Procedures    Medications Ordered in ED Medications  cefTRIAXone (ROCEPHIN) 1 g in sodium chloride 0.9 % 100 mL IVPB (0 g Intravenous Stopped 02/16/24 2014)    ED Course/ Medical Decision Making/ A&P                                 Medical Decision Making Amount and/or Complexity of Data Reviewed Labs: ordered.  Risk Prescription drug management.   This patient presents to the ED for concern of urinary retention, this involves an extensive number of treatment options, and is a complaint that carries with it a high risk of complications and morbidity.  The differential diagnosis includes outflow obstruction, infection, medication adverse reaction, neurologic impairment   Co morbidities that complicate the patient evaluation  Bladder cancer in remission   Additional history obtained:  Additional history obtained from 02/15/2024 ED note: patient with urinary retention; foley place and patient with outpatient urology follow up   Lab Tests:  I Ordered, and personally interpreted labs.  The pertinent results include:   -CBC: mild anemia; no leukocytosis -CMP: no elevation in Cr -UA: trace leukocytes, rare bacteria, 21-50WBC   Problem List / ED Course / Critical interventions / Medication management  Patient presents to the ED concerned for urinary retention. Foley catheter placed in ED last night and patient with outpatient urology follow up. Patient woke up this morning and realized that his catheter was not draining. Increasing suprapubic pain because of this. Triage nurse able to flush patient's catheter and taught patient how to flush catheter at home. Patient with chronically "clumpy" urine at baseline d/t bladder cancer hx. Patient in remission - no recent chemo/radiation.  Kidney  function test reassuring. UA with possible developing UTI. Patient stating that bag is draining appropriately. Patient feels good. Shared all results with patient. Answered all questions. Will provide patient with IV rocephin and send keflex to pharmacy. Patient understands to follow up with urologist.  Staffed with Dr. Theresia Lo who agrees with plan. I have reviewed the patients home medicines and have made adjustments as needed Patient afebrile with stable vitals. Provided with return precautions. Discharged in good condition.    Social Determinants of Health:  none          Final Clinical Impression(s) / ED Diagnoses Final diagnoses:  Urinary retention  Acute cystitis with hematuria    Rx / DC Orders ED Discharge Orders          Ordered    cephALEXin (KEFLEX) 500 MG capsule  2 times daily        02/16/24 1907              Dorthy Cooler, [place] 02/16/24 2126    Elayne Snare K, DO 02/16/24 2348

## 2024-02-16 NOTE — ED Provider Notes (Signed)
 Foley catheter successfully placed by nursing staff. Creatinine baseline.  Urinalysis negative for infection.  Stable for discharge with urology   Glynn Octave, MD 02/16/24 0230

## 2024-02-16 NOTE — ED Provider Triage Note (Signed)
 Emergency Medicine Provider Triage Evaluation Note  [facility] , a 68 y.o. male  was evaluated in triage.  Pt complains of urinary retention.  Review of Systems  Positive:  Negative:   Physical Exam  BP (!) 156/79 (BP Location: Left Arm)   Pulse (!) 58   Temp 98 F (36.7 C) (Oral)   Resp (!) 21   Ht 5\' 6"  (1.676 m)   Wt 57.4 kg   SpO2 100%   BMI 20.42 kg/m  Gen:   Awake, no distress   Resp:  Normal effort  MSK:   Moves extremities without difficulty  Other:    Medical Decision Making  Medically screening exam initiated at 5:28 PM.  Appropriate orders placed.  [facility] was informed that the remainder of the evaluation will be completed by another provider, this initial triage assessment does not replace that evaluation, and the importance of remaining in the ED until their evaluation is complete.  Catheter placed yesterday. Patient stating that there is no urine drainage today. Triage nurse will attempt to flush catheter and possibly change out catheter.   Dorthy Cooler, [place] 02/16/24 1730

## 2024-02-16 NOTE — ED Triage Notes (Signed)
 Pt had foley placed yesterday, pt has had no output in foley bag today. Pt is retaining and has over in bladder scan on arrival.

## 2024-02-16 NOTE — Discharge Instructions (Addendum)
 It was a pleasure caring for you today. Please follow up with your urologist. Seek emergency care if experiencing any new or worsening symptoms.

## 2024-02-16 NOTE — ED Notes (Signed)
 Pt catheter flushed with 50mL of NS and urine started draining. Obtained of output

## 2024-02-16 NOTE — Discharge Instructions (Signed)
 Keep the catheter in place and follow-up with the urologist.  Return to the ED with worsening symptom including fever, chills, unable to urinate, fever or other concerns

## 2024-02-17 LAB — URINE CULTURE: Culture: NO GROWTH

## 2024-02-18 LAB — URINE CULTURE: Culture: NO GROWTH

## 2024-02-22 ENCOUNTER — Other Ambulatory Visit: Payer: Self-pay | Admitting: Urology

## 2024-02-28 ENCOUNTER — Encounter (HOSPITAL_COMMUNITY): Admission: RE | Admit: 2024-02-28 | Source: Ambulatory Visit

## 2024-02-29 ENCOUNTER — Ambulatory Visit (HOSPITAL_COMMUNITY): Admission: RE | Admit: 2024-02-29 | Source: Home / Self Care | Admitting: Urology

## 2024-02-29 ENCOUNTER — Encounter (HOSPITAL_COMMUNITY): Admission: RE | Payer: Self-pay | Source: Home / Self Care

## 2024-02-29 SURGERY — CYSTOSCOPY, WITH CALCULUS MANIPULATION OR REMOVAL
Anesthesia: General

## 2024-06-22 ENCOUNTER — Ambulatory Visit (HOSPITAL_COMMUNITY)
Admission: RE | Admit: 2024-06-22 | Discharge: 2024-06-22 | Disposition: A | Discharge status: Home / Self Care | Source: Ambulatory Visit | Attending: Hematology and Oncology | Admitting: Hematology and Oncology

## 2024-06-22 DIAGNOSIS — Z95828 Presence of other vascular implants and grafts: Secondary | ICD-10-CM | POA: Insufficient documentation

## 2024-06-22 DIAGNOSIS — C670 Malignant neoplasm of trigone of bladder: Secondary | ICD-10-CM | POA: Diagnosis present

## 2024-06-22 LAB — GLUCOSE, CAPILLARY: Glucose-Capillary: 88 mg/dL (ref 70–99)

## 2024-06-22 MED ORDER — FLUDEOXYGLUCOSE F - 18 (FDG) INJECTION
6.3200 | Freq: Once | INTRAVENOUS | Status: AC
  Administered 2024-06-22: 6.32 via INTRAVENOUS

## 2024-06-28 ENCOUNTER — Other Ambulatory Visit: Payer: Self-pay | Admitting: Hematology and Oncology

## 2024-06-28 ENCOUNTER — Inpatient Hospital Stay: Payer: Medicare Other | Attending: Hematology and Oncology | Admitting: Hematology and Oncology

## 2024-06-28 ENCOUNTER — Inpatient Hospital Stay: Payer: Medicare Other

## 2024-06-28 VITALS — BP 126/73 | HR 46 | Temp 98.2°F | Resp 16 | Wt 124.4 lb

## 2024-06-28 DIAGNOSIS — Z9221 Personal history of antineoplastic chemotherapy: Secondary | ICD-10-CM | POA: Insufficient documentation

## 2024-06-28 DIAGNOSIS — Z95828 Presence of other vascular implants and grafts: Secondary | ICD-10-CM

## 2024-06-28 DIAGNOSIS — C670 Malignant neoplasm of trigone of bladder: Secondary | ICD-10-CM

## 2024-06-28 DIAGNOSIS — Z87891 Personal history of nicotine dependence: Secondary | ICD-10-CM | POA: Diagnosis not present

## 2024-06-28 DIAGNOSIS — Z8551 Personal history of malignant neoplasm of bladder: Secondary | ICD-10-CM | POA: Diagnosis present

## 2024-06-28 DIAGNOSIS — Z923 Personal history of irradiation: Secondary | ICD-10-CM | POA: Insufficient documentation

## 2024-06-28 LAB — CMP (CANCER CENTER ONLY)
ALT: 14 U/L (ref 0–44)
AST: 17 U/L (ref 15–41)
Albumin: 4.2 g/dL (ref 3.5–5.0)
Alkaline Phosphatase: 47 U/L (ref 38–126)
Anion gap: 3 — ABNORMAL LOW (ref 5–15)
BUN: 27 mg/dL — ABNORMAL HIGH (ref 8–23)
CO2: 31 mmol/L (ref 22–32)
Calcium: 9.2 mg/dL (ref 8.9–10.3)
Chloride: 106 mmol/L (ref 98–111)
Creatinine: 0.93 mg/dL (ref 0.61–1.24)
GFR, Estimated: >60 mL/min (ref >60)
Glucose, Bld: 94 mg/dL (ref 70–99)
Potassium: 4.2 mmol/L (ref 3.5–5.1)
Sodium: 140 mmol/L (ref 135–145)
Total Bilirubin: 0.7 mg/dL (ref 0.0–1.2)
Total Protein: 6.8 g/dL (ref 6.5–8.1)

## 2024-06-28 LAB — CBC WITH DIFFERENTIAL (CANCER CENTER ONLY)
Abs Immature Granulocytes: 0 K/uL (ref 0.00–0.07)
Basophils Absolute: 0 K/uL (ref 0.0–0.1)
Basophils Relative: 0 %
Eosinophils Absolute: 0.1 K/uL (ref 0.0–0.5)
Eosinophils Relative: 1 %
HCT: 40.9 % (ref 39.0–52.0)
Hemoglobin: 13.7 g/dL (ref 13.0–17.0)
Immature Granulocytes: 0 %
Lymphocytes Relative: 43 %
Lymphs Abs: 1.8 K/uL (ref 0.7–4.0)
MCH: 30.2 pg (ref 26.0–34.0)
MCHC: 33.5 g/dL (ref 30.0–36.0)
MCV: 90.1 fL (ref 80.0–100.0)
Monocytes Absolute: 0.2 K/uL (ref 0.1–1.0)
Monocytes Relative: 6 %
Neutro Abs: 2.1 K/uL (ref 1.7–7.7)
Neutrophils Relative %: 50 %
Platelet Count: 203 K/uL (ref 150–400)
RBC: 4.54 MIL/uL (ref 4.22–5.81)
RDW: 13.1 % (ref 11.5–15.5)
WBC Count: 4.3 K/uL (ref 4.0–10.5)
nRBC: 0 % (ref 0.0–0.2)

## 2024-06-28 NOTE — Progress Notes (Signed)
 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  He subsequently developed a pulmonary nodule.  05/16/2019: lung biopsy completed  06/02/2019: Gemcitabine  and cisplatin  chemotherapy cycle 1 started  09/21/2019: completed 6 cycles of therapy, had a near complete response with a residual pulmonary nodule.  10/30/2019: wedge resection of right upper lobe residual tumor using a right thoracotomy, pathology confirmed the presence of residual urothelial carcinoma.  02/2020: radiation therapy to the resected right upper lung, 50 Gray in 5 fractions  12/18/2022: last visit with Dr. Amadeo  Interval History:  Robert Lowery 68 y.o. male with medical history significant for stage IV high grade urothelial carcinoma of the bladder who presents for a follow up visit. The patient's last visit was on 12/30/2023. In the interim since the last visit he had a PET CT scan on 06/22/2024 which showed no overt evidence of residual or recurrent disease.  On exam today our conversation is assisted with a hospital approved interpreter.  Mr. Sebring reports he has been hospitalized twice in the interim since our last visit.  Once was when he could not urinate approximately 2 months ago.  He had a Foley placed and it subsequently resolved.  He notes that he did have some recent blood in a urine test.  He does not see any gross blood in his urine.  He reports that his energy levels are good and his appetite is strong.  He has lost some weight in the interim since our last visit.  Otherwise he is had no cough, fevers, chills, sweats, nausea, vomiting or diarrhea.  Overall he feels well.  He denies any other focal symptoms at this time.  A full 10 point ROS is otherwise negative.  MEDICAL  HISTORY:  Past Medical History:  Diagnosis Date   Bladder cancer (HCC) 05/10/2018   TUR of the BLADDER...DR. WATT   Chest pain    at rest   Hearing loss    right ear   Hepatitis    history of Hep. B   History of hiatal hernia 05/30/2016   Small retrocardiac, noted on CT   Internal hemorrhoids    Liver lesion    Lung cancer (HCC)    Mild cardiomegaly    Pre-diabetes    Scrotal nodule    Bilateral    SURGICAL HISTORY: Past Surgical History:  Procedure Laterality Date   BIOPSY TESTIS     bilateral testicle mass   COLONOSCOPY     CYSTOSCOPY/RETROGRADE/URETEROSCOPY N/A 05/10/2018   Procedure: BILATERAL RETROGRADE;  Surgeon: WATT Rush, MD;  Location: [facility];  Service: Urology;  Laterality: N/A;   IR IMAGING GUIDED PORT INSERTION  05/31/2019   IR REMOVAL TUN ACCESS W/ PORT W/O FL MOD SED  01/13/2024   THORACOTOMY Right 10/30/2019   Procedure: THORACOTOMY MAJOR;  Surgeon: Lucas Dorise POUR, MD;  Location: [facility] OR;  Service: Thoracic;  Laterality: Right;   TRANSURETHRAL RESECTION OF BLADDER TUMOR WITH MITOMYCIN -C N/A 05/10/2018   Procedure: CYSTOSCOPY TRANSURETHRAL RESECTION OF BLADDER TUMOR WITH MITOMYCIN -C;  Surgeon: WATT Rush, MD;  Location: [facility];  Service: Urology;  Laterality: N/A;   UPPER GI ENDOSCOPY     WEDGE RESECTION Right 10/30/2019   Procedure: WEDGE RESECTION;  Surgeon: Lucas Dorise POUR, MD;  Location: [facility] OR;  Service:  Thoracic;  Laterality: Right;    SOCIAL HISTORY: Social History   Socioeconomic History   Marital status: Married    Spouse name: Not on file   Number of children: Not on file   Years of education: Not on file   Highest education level: Not on file  Occupational History   Not on file  Tobacco Use   Smoking status: Former    Current packs/day: 0.00    Types: Cigarettes    Quit date: 1997    Years since quitting: 28.5   Smokeless tobacco: Never   Tobacco comments:    Quit smoking in 1997  Vaping Use   Vaping status: Never Used  Substance  and Sexual Activity   Alcohol use: Never   Drug use: Never   Sexual activity: Not on file  Other Topics Concern   Not on file  Social History Narrative   Not on file   Social Drivers of Health   Financial Resource Strain: Not on file  Food Insecurity: Not on file  Transportation Needs: Not on file  Physical Activity: Not on file  Stress: Not on file  Social Connections: Not on file  Intimate Partner Violence: Not on file    FAMILY HISTORY: No family history on file.  ALLERGIES:  has no known allergies.  MEDICATIONS:  Current Outpatient Medications  Medication Sig Dispense Refill   benzonatate  (TESSALON ) 200 MG capsule Take 1 capsule (200 mg total) by mouth 3 (three) times daily as needed for cough. 30 capsule 1   Calcium 200 MG TABS Take by mouth.     cholecalciferol (VITAMIN D3) 25 MCG (1000 UT) tablet Take 1,000 Units by mouth daily.     clotrimazole-betamethasone (LOTRISONE) cream Apply 1 Application topically 2 (two) times daily.     Menaquinone-7 (K2 PO) Take by mouth.     No current facility-administered medications for this visit.    REVIEW OF SYSTEMS:   Constitutional: ( - ) fevers, ( - )  chills , ( - ) night sweats Eyes: ( - ) blurriness of vision, ( - ) double vision, ( - ) watery eyes Ears, nose, mouth, throat, and face: ( - ) mucositis, ( - ) sore throat Respiratory: ( - ) cough, ( - ) dyspnea, ( - ) wheezes Cardiovascular: ( - ) palpitation, ( - ) chest discomfort, ( - ) lower extremity swelling Gastrointestinal:  ( - ) nausea, ( - ) heartburn, ( - ) change in bowel habits Skin: ( - ) abnormal skin rashes Lymphatics: ( - ) new lymphadenopathy, ( - ) easy bruising Neurological: ( - ) numbness, ( - ) tingling, ( - ) new weaknesses Behavioral/Psych: ( - ) mood change, ( - ) new changes  All other systems were reviewed with the patient and are negative.  PHYSICAL EXAMINATION: ECOG PERFORMANCE STATUS: 0 - Asymptomatic  Vitals:   06/28/24 1024  BP:  126/73  Pulse: (!) 46  Resp: 16  Temp: 98.2 F (36.8 C)  SpO2: 98%     Filed Weights   06/28/24 1024  Weight: 124 lb 6.4 oz (56.4 kg)      GENERAL: well appearing elderly [place] male alert, no distress and comfortable SKIN: skin color, texture, turgor are normal, no rashes or significant lesions EYES: conjunctiva are pink and non-injected, sclera clear LUNGS: clear to auscultation and percussion with normal breathing effort HEART: regular rate & rhythm and no murmurs and no lower extremity edema Musculoskeletal: no cyanosis of digits and  no clubbing  PSYCH: alert & oriented x 3, fluent speech NEURO: no focal motor/sensory deficits  LABORATORY DATA:  I have reviewed the data as listed    Latest Ref Rng & Units 06/28/2024    9:51 AM 02/16/2024    5:38 PM 02/15/2024    9:07 PM  CBC  WBC 4.0 - 10.5 K/uL 4.3  6.6  8.1   Hemoglobin 13.0 - 17.0 g/dL 86.2  87.5  87.0   Hematocrit 39.0 - 52.0 % 40.9  37.3  38.8   Platelets 150 - 400 K/uL 203  178  188        Latest Ref Rng & Units 06/28/2024    9:51 AM 02/16/2024    5:38 PM 02/16/2024    1:03 AM  CMP  Glucose 70 - 99 mg/dL 94  86  821   BUN 8 - 23 mg/dL 27  26  25    Creatinine 0.61 - 1.24 mg/dL 9.06  9.07  9.11   Sodium 135 - 145 mmol/L 140  139  137   Potassium 3.5 - 5.1 mmol/L 4.2  3.6  3.4   Chloride 98 - 111 mmol/L 106  107  108   CO2 22 - 32 mmol/L 31  24  23    Calcium 8.9 - 10.3 mg/dL 9.2  8.7  8.2   Total Protein 6.5 - 8.1 g/dL 6.8  6.1    Total Bilirubin 0.0 - 1.2 mg/dL 0.7  0.7    Alkaline Phos 38 - 126 U/L 47  44    AST 15 - 41 U/L 17  20    ALT 0 - 44 U/L 14  19      RADIOGRAPHIC STUDIES: NM PET Image Restag (PS) Skull Base To Thigh Result Date: 06/24/2024 CLINICAL DATA:  Subsequent treatment strategy for bladder cancer. EXAM: NUCLEAR MEDICINE PET SKULL BASE TO THIGH TECHNIQUE: 6.3 mCi F-18 FDG was injected intravenously. Full-ring PET imaging was performed from the skull base to thigh after the radiotracer. CT  data was obtained and used for attenuation correction and anatomic localization. Fasting blood glucose: 88 mg/dl COMPARISON:  07/19/7973 FINDINGS: Mediastinal blood pool activity: SUV max 2.0 Liver activity: SUV max NA NECK: Likely physiologic palatine tonsillar activity with maximum SUV 6.4 on the left and 5.9 on the right. Left level III B lymph node 0.6 cm in short axis on image 42 series 4 with maximum SUV 2.3, previously 0.4 cm in short axis with maximum SUV 2.7. This is only minimally above blood pool likely incidental. Incidental CT findings: None. CHEST: Similar distribution of bilateral small hilar AP window, right infrahilar hypermetabolic lymph nodes, at least 1 of these in the right hilum is calcified and there is evidence of old granulomatous disease in the chest bilaterally along with postoperative findings in the right upper lobe. Index right hilar node maximum SUV 8.5, formerly 8.0. Distribution unchanged prior. This could reflect chronic active granulomatous process and similar findings of been present on multiple prior exams back through 08/01/2019. Incidental CT findings: Atheromatous vascular calcification of the thoracic aorta and branch vessels. ABDOMEN/PELVIS: Photopenic hepatic cysts warrant no further imaging workup. Excreted contrast in the right eccentric neobladder, status post cystoprostatectomy. Low-grade activity in a small left periaortic lymph node measuring about 4 mm in short axis on image 138 series 4, maximum SUV 3.7. This merits surveillance. There could be some interfering activity from an adjacent loop of bowel. A small left external iliac node measuring 8 mm in short axis  on image 174 series 4 demonstrates accentuated metabolic activity, maximum SUV 4.3 previously 2.7. Incidental CT findings: Atherosclerosis is present, including aortoiliac atherosclerotic disease. Prominent stool throughout the colon favors constipation. SKELETON: No significant abnormal hypermetabolic activity  in this region. Incidental CT findings: Lumbar spondylosis and degenerative disc disease. IMPRESSION: 1. Low-grade activity in a small left periaortic lymph node measuring about 4 mm in short axis, maximum SUV 3.7. There is also accentuated activity in a small left external iliac node with maximum SUV 4.3. Possibilities include reactive lymph nodes or early indicators of recurrent malignancy, careful surveillance recommended. 2. Similar distribution hypermetabolic small mediastinal lymph nodes in the setting of old granulomatous disease of the chest; similar appearance all the way back through 2020 favors a benign chronic granulomatous etiology. 3. Prominent stool throughout the colon favors constipation. 4. Lumbar spondylosis and degenerative disc disease. 5.  Aortic Atherosclerosis (ICD10-I70.0). Electronically Signed   By: Ryan Salvage M.D.   On: 06/24/2024 15:44    ASSESSMENT & PLAN Robert Lowery 68 y.o. male with medical history significant for stage IV high grade urothelial carcinoma of the bladder who presents for a follow up visit.  After review of the labs, review the records, discussion with the patient the findings are most consistent with a stage IV high-grade urothelial carcinoma the bladder, now in remission.  The patient underwent extensive treatment including chemotherapy, surgery, and radiation.  He is disease-free based off his last PET CT scan.  At this time would recommend continued surveillance and monitoring with his next checkup in January 2025.  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 -- Labs show white blood cell 4.3, hemoglobin 13.7, MCV 90.1, platelets 203.  Cr and LFTs within normal limits. -- Plan for repeat PET CT scan in 6 months time with clinic visit in Jan 2026.  After that visit and see PET scan would recommend yearly clinic  visits with yearly renal ultrasounds 1 week before (per NCCN guidelines). Repeating PET due to mild FDG activity in prior scan in July 2025.  --RTC in Jan 2026.   Orders Placed This Encounter  Procedures   NM PET Image Restag (PS) Skull Base To Thigh    Standing Status:   Future    Expected Date:   12/18/2024    Expiration Date:   07/09/2025    If indicated for the ordered procedure, I authorize the administration of a radiopharmaceutical per Radiology protocol:   Yes    Preferred imaging location?:   [facility]    All questions were answered. The patient knows to call the clinic with any problems, questions or concerns.  A total of more than 30 minutes were spent on this encounter with face-to-face time and non-face-to-face time, including preparing to see the patient, ordering tests and/or medications, counseling the patient and coordination of care as outlined above.   Norleen IVAR Kidney, MD Department of Hematology/Oncology [facility] Phone: [phone] Pager: [phone] Email: [email or web address]  07/09/2024 3:32 PM

## 2024-07-09 ENCOUNTER — Encounter: Payer: Self-pay | Admitting: Hematology and Oncology

## 2024-12-18 ENCOUNTER — Ambulatory Visit (HOSPITAL_COMMUNITY)
Admission: RE | Admit: 2024-12-18 | Discharge: 2024-12-18 | Disposition: A | Discharge status: Home / Self Care | Source: Ambulatory Visit | Attending: Hematology and Oncology | Admitting: Hematology and Oncology

## 2024-12-18 DIAGNOSIS — C670 Malignant neoplasm of trigone of bladder: Secondary | ICD-10-CM | POA: Insufficient documentation

## 2024-12-18 LAB — GLUCOSE, CAPILLARY: Glucose-Capillary: 98 mg/dL (ref 70–99)

## 2024-12-18 MED ORDER — FLUDEOXYGLUCOSE F - 18 (FDG) INJECTION
6.2000 | Freq: Once | INTRAVENOUS | Status: AC | PRN
  Administered 2024-12-18: 6.17 via INTRAVENOUS

## 2024-12-26 NOTE — Progress Notes (Unsigned)
 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  He subsequently developed a pulmonary nodule.  05/16/2019: lung biopsy completed  06/02/2019: Gemcitabine  and cisplatin  chemotherapy cycle 1 started  09/21/2019: completed 6 cycles of therapy, had a near complete response with a residual pulmonary nodule.  10/30/2019: wedge resection of right upper lobe residual tumor using a right thoracotomy, pathology confirmed the presence of residual urothelial carcinoma.  02/2020: radiation therapy to the resected right upper lung, 50 Gray in 5 fractions  12/18/2022: last visit with Dr. Amadeo  Interval History:  Robert Lowery 69 y.o. male with medical history significant for stage IV high grade urothelial carcinoma of the bladder who presents for a follow up visit. The patient's last visit was on 06/28/2024. In the interim since the last visit he had a PET CT scan on 12/18/2024 which showed small, mildly hypermetabolic abdominal retroperitoneal and left external iliac nodes, favoring a benign/reactive etiology  On exam our conversation with Robert Lowery is assisted by a hospital approved interpreter. Today he reports he has been well overall with interim since our last visit.  He said no major changes in his health.  He reports his energy is strong and his appetite is good.  His weight is up about 2 pounds in the interim since our last visit.  He reports that he is had no recent infections such as fevers, chills, sweats, runny nose, sore throat, cough.  He reports nothing else out of the ordinary.  He reports that he is not currently having any pain or discomfort.  He denies any bone or back pain and is not having any urinary symptoms.  Overall he feels well and has no  questions concerns or complaints today.  A full 10 point ROS is otherwise negative.  MEDICAL HISTORY:  Past Medical History:  Diagnosis Date   Bladder cancer (HCC) 05/10/2018   TUR of the BLADDER...DR. WATT   Chest pain    at rest   Hearing loss    right ear   Hepatitis    history of Hep. B   History of hiatal hernia 05/30/2016   Small retrocardiac, noted on CT   Internal hemorrhoids    Liver lesion    Lung cancer (HCC)    Mild cardiomegaly    Pre-diabetes    Scrotal nodule    Bilateral    SURGICAL HISTORY: Past Surgical History:  Procedure Laterality Date   BIOPSY TESTIS     bilateral testicle mass   COLONOSCOPY     CYSTOSCOPY/RETROGRADE/URETEROSCOPY N/A 05/10/2018   Procedure: BILATERAL RETROGRADE;  Surgeon: Watt Rush, MD;  Location: [facility];  Service: Urology;  Laterality: N/A;   IR IMAGING GUIDED PORT INSERTION  05/31/2019   IR REMOVAL TUN ACCESS W/ PORT W/O FL MOD SED  01/13/2024   THORACOTOMY Right 10/30/2019   Procedure: THORACOTOMY MAJOR;  Surgeon: Lucas Dorise POUR, MD;  Location: [facility] OR;  Service: Thoracic;  Laterality: Right;   TRANSURETHRAL RESECTION OF BLADDER TUMOR WITH MITOMYCIN -C N/A 05/10/2018   Procedure: CYSTOSCOPY TRANSURETHRAL RESECTION OF BLADDER TUMOR WITH MITOMYCIN -C;  Surgeon: Watt Rush, MD;  Location: [facility];  Service: Urology;  Laterality: N/A;   UPPER GI ENDOSCOPY     WEDGE RESECTION Right 10/30/2019   Procedure: WEDGE RESECTION;  Surgeon: Lucas Dorise POUR, MD;  Location: [facility] OR;  Service: Thoracic;  Laterality: Right;    SOCIAL HISTORY: Social History   Socioeconomic History   Marital status: Married    Spouse name: Not on file   Number of children: Not on file   Years of education: Not on file   Highest education level: Not on file  Occupational History   Not on file  Tobacco Use   Smoking status: Former    Current packs/day: 0.00    Types: Cigarettes    Quit date: 84    Years since quitting: 29.0   Smokeless tobacco: Never    Tobacco comments:    Quit smoking in 1997  Vaping Use   Vaping status: Never Used  Substance and Sexual Activity   Alcohol use: Never   Drug use: Never   Sexual activity: Not on file  Other Topics Concern   Not on file  Social History Narrative   Not on file   Social Drivers of Health   Tobacco Use: Medium Risk (01/13/2024)   Patient History    Smoking Tobacco Use: Former    Smokeless Tobacco Use: Never    Passive Exposure: Not on Actuary Strain: Not on file  Food Insecurity: Not on file  Transportation Needs: Not on file  Physical Activity: Not on file  Stress: Not on file  Social Connections: Not on file  Intimate Partner Violence: Not on file  Depression (EYV7-0): Not on file  Alcohol Screen: Not on file  Housing: Not on file  Utilities: Not on file  Health Literacy: Not on file    FAMILY HISTORY: No family history on file.  ALLERGIES:  has no known allergies.  MEDICATIONS:  Current Outpatient Medications  Medication Sig Dispense Refill   benzonatate  (TESSALON ) 200 MG capsule Take 1 capsule (200 mg total) by mouth 3 (three) times daily as needed for cough. 30 capsule 1   Calcium 200 MG TABS Take by mouth.     cholecalciferol (VITAMIN D3) 25 MCG (1000 UT) tablet Take 1,000 Units by mouth daily.     clotrimazole-betamethasone (LOTRISONE) cream Apply 1 Application topically 2 (two) times daily.     Menaquinone-7 (K2 PO) Take by mouth.     No current facility-administered medications for this visit.    REVIEW OF SYSTEMS:   Constitutional: ( - ) fevers, ( - )  chills , ( - ) night sweats Eyes: ( - ) blurriness of vision, ( - ) double vision, ( - ) watery eyes Ears, nose, mouth, throat, and face: ( - ) mucositis, ( - ) sore throat Respiratory: ( - ) cough, ( - ) dyspnea, ( - ) wheezes Cardiovascular: ( - ) palpitation, ( - ) chest discomfort, ( - ) lower extremity swelling Gastrointestinal:  ( - ) nausea, ( - ) heartburn, ( - ) change in bowel  habits Skin: ( - ) abnormal skin rashes Lymphatics: ( - ) new lymphadenopathy, ( - ) easy bruising Neurological: ( - ) numbness, ( - ) tingling, ( - ) new weaknesses Behavioral/Psych: ( - ) mood change, ( - ) new changes  All other systems were reviewed with the patient and are negative.  PHYSICAL EXAMINATION: ECOG PERFORMANCE STATUS: 0 - Asymptomatic  Vitals:   12/27/24 1115  BP: 138/65  Pulse: (!) 47  Resp: 16  Temp: 98.1 F (36.7 C)  SpO2: 99%   Filed Weights   12/27/24 1115  Weight: 130 lb  4.8 oz (59.1 kg)    GENERAL: well appearing elderly [place] male alert, no distress and comfortable SKIN: skin color, texture, turgor are normal, no rashes or significant lesions EYES: conjunctiva are pink and non-injected, sclera clear LUNGS: clear to auscultation and percussion with normal breathing effort HEART: regular rate & rhythm and no murmurs and no lower extremity edema Musculoskeletal: no cyanosis of digits and no clubbing  PSYCH: alert & oriented x 3, fluent speech NEURO: no focal motor/sensory deficits  LABORATORY DATA:  I have reviewed the data as listed    Latest Ref Rng & Units 12/27/2024   10:50 AM 06/28/2024    9:51 AM 02/16/2024    5:38 PM  CBC  WBC 4.0 - 10.5 K/uL 4.7  4.3  6.6   Hemoglobin 13.0 - 17.0 g/dL 86.4  86.2  87.5   Hematocrit 39.0 - 52.0 % 40.1  40.9  37.3   Platelets 150 - 400 K/uL 216  203  178        Latest Ref Rng & Units 12/27/2024   10:50 AM 06/28/2024    9:51 AM 02/16/2024    5:38 PM  CMP  Glucose 70 - 99 mg/dL 872  94  86   BUN 8 - 23 mg/dL 22  27  26    Creatinine 0.61 - 1.24 mg/dL 8.91  9.06  9.07   Sodium 135 - 145 mmol/L 140  140  139   Potassium 3.5 - 5.1 mmol/L 4.4  4.2  3.6   Chloride 98 - 111 mmol/L 103  106  107   CO2 22 - 32 mmol/L 27  31  24    Calcium 8.9 - 10.3 mg/dL 9.0  9.2  8.7   Total Protein 6.5 - 8.1 g/dL 6.7  6.8  6.1   Total Bilirubin 0.0 - 1.2 mg/dL 0.5  0.7  0.7   Alkaline Phos 38 - 126 U/L 53  47  44   AST 15 -  41 U/L 20  17  20    ALT 0 - 44 U/L 15  14  19      RADIOGRAPHIC STUDIES: NM PET Image Restag (PS) Skull Base To Thigh Result Date: 12/24/2024 CLINICAL DATA:  Subsequent treatment strategy for bladder cancer. Evaluate treatment response. EXAM: NUCLEAR MEDICINE PET SKULL BASE TO THIGH TECHNIQUE: 6.2 mCi F-18 FDG was injected intravenously. Full-ring PET imaging was performed from the skull base to thigh after the radiotracer. CT data was obtained and used for attenuation correction and anatomic localization. Fasting blood glucose: 98 mg/dl COMPARISON:  92/89/7974 FINDINGS: Mediastinal blood pool activity: SUV max 1.9 Liver activity: SUV max NA NECK: No areas of abnormal hypermetabolism. Incidental CT findings: No cervical adenopathy. CHEST: No hypermetabolic pulmonary nodules. Similar distribution of mediastinal and right hilar small hypermetabolic nodes. Example right hilar node at SUV 6.1 today versus SUV 8.5 on the prior. Incidental CT findings: Extensive calcified granulomas. Surgical sutures in the posterior right upper lobe. Similar surrounding soft tissue thickening. Aortic and coronary artery calcification. ABDOMEN/PELVIS: the low left periaortic node measures 4 mm and SUV 3.8 on image 141/4 versus similar in size and SUV 3.7 on the prior. Index left external iliac node measures 8 mm and SUV 4.0 today versus similar in size and SUV 4.3 on the prior. Incidental CT findings: Bilateral hepatic cysts. Normal adrenal glands. Mild bilateral renal collecting system fullness, likely physiologic in the setting of cystoprostatectomy and neobladder creation. Abdominal aortic atherosclerosis. Large colonic stool burden. SKELETON: No abnormal marrow activity.  Incidental CT findings: None. IMPRESSION: 1. Similar small, mildly hypermetabolic abdominal retroperitoneal and left external iliac nodes, favoring a benign/reactive etiology. 2. No findings of hypermetabolic metastatic disease. 3. Hypermetabolic small thoracic  nodes are similar, favored to be reactive and likely related to old granulomatous disease, given calcified pulmonary nodules and nodes. 4.  Possible constipation. 5. Coronary artery atherosclerosis. Aortic Atherosclerosis (ICD10-I70.0). Electronically Signed   By: Rockey Kilts M.D.   On: 12/24/2024 12:24    ASSESSMENT & PLAN Robert Lowery 69 y.o. male with medical history significant for stage IV high grade urothelial carcinoma of the bladder who presents for a follow up visit.  After review of the labs, review the records, discussion with the patient the findings are most consistent with a stage IV high-grade urothelial carcinoma the bladder, now in remission.  The patient underwent extensive treatment including chemotherapy, surgery, and radiation.  He is disease-free based off his last PET CT scan.  At this time would recommend continued surveillance and monitoring with his next checkup in January 2025.  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 -- Labs show white blood cell 4.7, Hgb 13.5, MCV 90.1, Plt 216.  Cr and LFTs within normal limits. -- Plan for repeat PET CT scan in 12 months time with clinic visit in Jan 2027.    Orders Placed This Encounter  Procedures   NM PET Image Restag (PS) Skull Base To Thigh    Standing Status:   Future    Expected Date:   12/17/2025    Expiration Date:   03/17/2026    If indicated for the ordered procedure, I authorize the administration of a radiopharmaceutical per Radiology protocol:   Yes    Preferred imaging location?:   [facility]    Release to patient:   Immediate    All questions were answered. The patient knows to call the clinic with any problems, questions or concerns.  A total of more than 25 minutes were spent on this encounter with face-to-face time and non-face-to-face time, including preparing to  see the patient, ordering tests and/or medications, counseling the patient and coordination of care as outlined above.   Norleen IVAR Kidney, MD Department of Hematology/Oncology [facility] at [facility] Phone: [phone] Pager: [phone] Email: [email or web address]  12/28/2024 9:02 AM  "

## 2024-12-27 ENCOUNTER — Inpatient Hospital Stay (HOSPITAL_BASED_OUTPATIENT_CLINIC_OR_DEPARTMENT_OTHER): Admitting: Hematology and Oncology

## 2024-12-27 ENCOUNTER — Other Ambulatory Visit: Payer: Self-pay | Admitting: *Deleted

## 2024-12-27 ENCOUNTER — Inpatient Hospital Stay: Attending: Hematology and Oncology

## 2024-12-27 VITALS — BP 138/65 | HR 47 | Temp 98.1°F | Resp 16 | Wt 130.3 lb

## 2024-12-27 DIAGNOSIS — Z8551 Personal history of malignant neoplasm of bladder: Secondary | ICD-10-CM | POA: Diagnosis present

## 2024-12-27 DIAGNOSIS — Z87891 Personal history of nicotine dependence: Secondary | ICD-10-CM | POA: Insufficient documentation

## 2024-12-27 DIAGNOSIS — Z923 Personal history of irradiation: Secondary | ICD-10-CM | POA: Insufficient documentation

## 2024-12-27 DIAGNOSIS — C670 Malignant neoplasm of trigone of bladder: Secondary | ICD-10-CM

## 2024-12-27 DIAGNOSIS — Z9221 Personal history of antineoplastic chemotherapy: Secondary | ICD-10-CM | POA: Insufficient documentation

## 2024-12-27 DIAGNOSIS — Z95828 Presence of other vascular implants and grafts: Secondary | ICD-10-CM

## 2024-12-27 LAB — CBC WITH DIFFERENTIAL (CANCER CENTER ONLY)
Abs Immature Granulocytes: 0.01 K/uL (ref 0.00–0.07)
Basophils Absolute: 0 K/uL (ref 0.0–0.1)
Basophils Relative: 0 %
Eosinophils Absolute: 0.1 K/uL (ref 0.0–0.5)
Eosinophils Relative: 1 %
HCT: 40.1 % (ref 39.0–52.0)
Hemoglobin: 13.5 g/dL (ref 13.0–17.0)
Immature Granulocytes: 0 %
Lymphocytes Relative: 51 %
Lymphs Abs: 2.4 K/uL (ref 0.7–4.0)
MCH: 30.3 pg (ref 26.0–34.0)
MCHC: 33.7 g/dL (ref 30.0–36.0)
MCV: 90.1 fL (ref 80.0–100.0)
Monocytes Absolute: 0.4 K/uL (ref 0.1–1.0)
Monocytes Relative: 8 %
Neutro Abs: 1.9 K/uL (ref 1.7–7.7)
Neutrophils Relative %: 40 %
Platelet Count: 216 K/uL (ref 150–400)
RBC: 4.45 MIL/uL (ref 4.22–5.81)
RDW: 13.2 % (ref 11.5–15.5)
WBC Count: 4.7 K/uL (ref 4.0–10.5)
nRBC: 0 % (ref 0.0–0.2)

## 2024-12-27 LAB — CMP (CANCER CENTER ONLY)
ALT: 15 U/L (ref 0–44)
AST: 20 U/L (ref 15–41)
Albumin: 4.2 g/dL (ref 3.5–5.0)
Alkaline Phosphatase: 53 U/L (ref 38–126)
Anion gap: 11 (ref 5–15)
BUN: 22 mg/dL (ref 8–23)
CO2: 27 mmol/L (ref 22–32)
Calcium: 9 mg/dL (ref 8.9–10.3)
Chloride: 103 mmol/L (ref 98–111)
Creatinine: 1.08 mg/dL (ref 0.61–1.24)
GFR, Estimated: >60 mL/min
Glucose, Bld: 127 mg/dL — ABNORMAL HIGH (ref 70–99)
Potassium: 4.4 mmol/L (ref 3.5–5.1)
Sodium: 140 mmol/L (ref 135–145)
Total Bilirubin: 0.5 mg/dL (ref 0.0–1.2)
Total Protein: 6.7 g/dL (ref 6.5–8.1)

## 2024-12-28 ENCOUNTER — Encounter: Payer: Self-pay | Admitting: Hematology and Oncology
